# Patient Record
Sex: Female | Born: 1950 | Race: White | Hispanic: No | Marital: Married | Smoking: Former smoker
Health system: Southern US, Community
[De-identification: ages and names within clinical notes are randomized; demographics above are authoritative.]

## PROBLEM LIST (undated history)

## (undated) DIAGNOSIS — C801 Malignant (primary) neoplasm, unspecified: Secondary | ICD-10-CM

## (undated) DIAGNOSIS — K828 Other specified diseases of gallbladder: Secondary | ICD-10-CM

## (undated) DIAGNOSIS — E785 Hyperlipidemia, unspecified: Secondary | ICD-10-CM

## (undated) DIAGNOSIS — Z9889 Other specified postprocedural states: Secondary | ICD-10-CM

## (undated) DIAGNOSIS — N2889 Other specified disorders of kidney and ureter: Secondary | ICD-10-CM

## (undated) DIAGNOSIS — Z8719 Personal history of other diseases of the digestive system: Secondary | ICD-10-CM

## (undated) DIAGNOSIS — R112 Nausea with vomiting, unspecified: Secondary | ICD-10-CM

## (undated) HISTORY — PX: COLONOSCOPY: SHX174

## (undated) HISTORY — PX: HERNIA REPAIR: SHX51

## (undated) HISTORY — PX: BLADDER SUSPENSION: SHX72

## (undated) HISTORY — DX: Malignant (primary) neoplasm, unspecified: C80.1

## (undated) HISTORY — DX: Hyperlipidemia, unspecified: E78.5

---

## 1973-08-26 HISTORY — PX: ECTOPIC PREGNANCY SURGERY: SHX613

## 1981-08-26 HISTORY — PX: TUBAL LIGATION: SHX77

## 1983-08-27 HISTORY — PX: POLYPECTOMY: SHX149

## 1983-08-27 HISTORY — PX: RHINOPLASTY: SUR1284

## 1990-08-26 HISTORY — PX: TUMOR REMOVAL: SHX12

## 1998-09-06 ENCOUNTER — Other Ambulatory Visit: Admission: RE | Admit: 1998-09-06 | Discharge: 1998-09-06 | Payer: Self-pay | Admitting: Gynecology

## 1999-12-24 ENCOUNTER — Other Ambulatory Visit: Admission: RE | Admit: 1999-12-24 | Discharge: 1999-12-24 | Payer: Self-pay | Admitting: Gynecology

## 2001-04-06 ENCOUNTER — Other Ambulatory Visit: Admission: RE | Admit: 2001-04-06 | Discharge: 2001-04-06 | Payer: Self-pay | Admitting: Gynecology

## 2002-05-31 ENCOUNTER — Other Ambulatory Visit: Admission: RE | Admit: 2002-05-31 | Discharge: 2002-05-31 | Payer: Self-pay | Admitting: Gynecology

## 2003-06-13 ENCOUNTER — Other Ambulatory Visit: Admission: RE | Admit: 2003-06-13 | Discharge: 2003-06-13 | Payer: Self-pay | Admitting: Gynecology

## 2003-08-27 HISTORY — PX: NOVASURE ABLATION: SHX5394

## 2003-12-17 ENCOUNTER — Emergency Department (HOSPITAL_COMMUNITY): Admission: EM | Admit: 2003-12-17 | Discharge: 2003-12-17 | Payer: Self-pay | Admitting: Family Medicine

## 2004-08-03 ENCOUNTER — Other Ambulatory Visit: Admission: RE | Admit: 2004-08-03 | Discharge: 2004-08-03 | Payer: Self-pay | Admitting: Gynecology

## 2004-08-22 ENCOUNTER — Ambulatory Visit (HOSPITAL_COMMUNITY): Admission: RE | Admit: 2004-08-22 | Discharge: 2004-08-22 | Payer: Self-pay | Admitting: Obstetrics and Gynecology

## 2004-08-22 ENCOUNTER — Encounter (INDEPENDENT_AMBULATORY_CARE_PROVIDER_SITE_OTHER): Payer: Self-pay | Admitting: *Deleted

## 2005-05-26 HISTORY — PX: SPLENECTOMY, TOTAL: SHX788

## 2005-05-26 HISTORY — PX: NEPHRECTOMY: SHX65

## 2005-06-07 ENCOUNTER — Ambulatory Visit (HOSPITAL_COMMUNITY): Admission: RE | Admit: 2005-06-07 | Discharge: 2005-06-07 | Payer: Self-pay | Admitting: Urology

## 2005-06-10 ENCOUNTER — Encounter (INDEPENDENT_AMBULATORY_CARE_PROVIDER_SITE_OTHER): Payer: Self-pay | Admitting: *Deleted

## 2005-06-10 ENCOUNTER — Inpatient Hospital Stay (HOSPITAL_COMMUNITY): Admission: RE | Admit: 2005-06-10 | Discharge: 2005-06-14 | Payer: Self-pay | Admitting: Urology

## 2005-10-25 ENCOUNTER — Ambulatory Visit (HOSPITAL_COMMUNITY): Admission: RE | Admit: 2005-10-25 | Discharge: 2005-10-25 | Payer: Self-pay | Admitting: Obstetrics and Gynecology

## 2005-11-14 ENCOUNTER — Other Ambulatory Visit: Admission: RE | Admit: 2005-11-14 | Discharge: 2005-11-14 | Payer: Self-pay | Admitting: Obstetrics and Gynecology

## 2005-11-26 ENCOUNTER — Ambulatory Visit (HOSPITAL_COMMUNITY): Admission: RE | Admit: 2005-11-26 | Discharge: 2005-11-26 | Payer: Self-pay | Admitting: Obstetrics and Gynecology

## 2005-12-26 ENCOUNTER — Ambulatory Visit (HOSPITAL_COMMUNITY): Admission: RE | Admit: 2005-12-26 | Discharge: 2005-12-26 | Payer: Self-pay | Admitting: Urology

## 2006-03-13 ENCOUNTER — Ambulatory Visit (HOSPITAL_COMMUNITY): Admission: RE | Admit: 2006-03-13 | Discharge: 2006-03-13 | Payer: Self-pay | Admitting: Urology

## 2006-08-28 ENCOUNTER — Encounter: Admission: RE | Admit: 2006-08-28 | Discharge: 2006-08-28 | Payer: Self-pay | Admitting: Family Medicine

## 2006-09-08 ENCOUNTER — Encounter: Admission: RE | Admit: 2006-09-08 | Discharge: 2006-09-08 | Payer: Self-pay | Admitting: Family Medicine

## 2007-01-01 ENCOUNTER — Ambulatory Visit (HOSPITAL_COMMUNITY): Admission: RE | Admit: 2007-01-01 | Discharge: 2007-01-01 | Payer: Self-pay | Admitting: Obstetrics and Gynecology

## 2007-02-05 ENCOUNTER — Encounter: Admission: RE | Admit: 2007-02-05 | Discharge: 2007-02-05 | Payer: Self-pay | Admitting: Family Medicine

## 2007-08-27 HISTORY — PX: LOBECTOMY: SHX5089

## 2007-12-09 ENCOUNTER — Ambulatory Visit (HOSPITAL_COMMUNITY): Admission: RE | Admit: 2007-12-09 | Discharge: 2007-12-09 | Payer: Self-pay | Admitting: Obstetrics and Gynecology

## 2008-02-18 ENCOUNTER — Ambulatory Visit (HOSPITAL_COMMUNITY): Admission: RE | Admit: 2008-02-18 | Discharge: 2008-02-18 | Payer: Self-pay | Admitting: Obstetrics and Gynecology

## 2008-02-29 ENCOUNTER — Encounter: Admission: RE | Admit: 2008-02-29 | Discharge: 2008-02-29 | Payer: Self-pay | Admitting: Family Medicine

## 2008-03-04 ENCOUNTER — Ambulatory Visit (HOSPITAL_COMMUNITY): Admission: RE | Admit: 2008-03-04 | Discharge: 2008-03-04 | Payer: Self-pay | Admitting: Family Medicine

## 2008-03-15 ENCOUNTER — Ambulatory Visit: Payer: Self-pay | Admitting: Thoracic Surgery

## 2008-05-31 ENCOUNTER — Encounter: Admission: RE | Admit: 2008-05-31 | Discharge: 2008-05-31 | Payer: Self-pay | Admitting: Thoracic Surgery

## 2008-05-31 ENCOUNTER — Ambulatory Visit: Payer: Self-pay | Admitting: Thoracic Surgery

## 2008-08-03 ENCOUNTER — Ambulatory Visit: Payer: Self-pay | Admitting: Thoracic Surgery

## 2008-08-03 ENCOUNTER — Encounter: Admission: RE | Admit: 2008-08-03 | Discharge: 2008-08-03 | Payer: Self-pay | Admitting: Thoracic Surgery

## 2008-08-12 ENCOUNTER — Ambulatory Visit: Payer: Self-pay | Admitting: Thoracic Surgery

## 2008-08-12 ENCOUNTER — Encounter: Payer: Self-pay | Admitting: Thoracic Surgery

## 2008-08-12 ENCOUNTER — Inpatient Hospital Stay (HOSPITAL_COMMUNITY): Admission: RE | Admit: 2008-08-12 | Discharge: 2008-08-15 | Payer: Self-pay | Admitting: Thoracic Surgery

## 2008-08-24 ENCOUNTER — Ambulatory Visit: Payer: Self-pay | Admitting: Thoracic Surgery

## 2008-08-24 ENCOUNTER — Encounter: Admission: RE | Admit: 2008-08-24 | Discharge: 2008-08-24 | Payer: Self-pay | Admitting: Thoracic Surgery

## 2008-08-29 ENCOUNTER — Ambulatory Visit: Payer: Self-pay | Admitting: Oncology

## 2008-09-06 LAB — CBC WITH DIFFERENTIAL/PLATELET
EOS%: 8.1 % — ABNORMAL HIGH (ref 0.0–7.0)
Eosinophils Absolute: 0.6 10*3/uL — ABNORMAL HIGH (ref 0.0–0.5)
HCT: 37.9 % (ref 34.8–46.6)
HGB: 12.8 g/dL (ref 11.6–15.9)
LYMPH%: 37.2 % (ref 14.0–48.0)
MCH: 32.8 pg (ref 26.0–34.0)
MCV: 97.2 fL (ref 81.0–101.0)
MONO%: 9.2 % (ref 0.0–13.0)
Platelets: 366 10*3/uL (ref 145–400)
RBC: 3.9 10*6/uL (ref 3.70–5.32)
WBC: 7.8 10*3/uL (ref 3.9–10.0)

## 2008-09-06 LAB — COMPREHENSIVE METABOLIC PANEL
AST: 21 U/L (ref 0–37)
Alkaline Phosphatase: 62 U/L (ref 39–117)
BUN: 16 mg/dL (ref 6–23)
Glucose, Bld: 94 mg/dL (ref 70–99)
Sodium: 142 mEq/L (ref 135–145)
Total Bilirubin: 0.4 mg/dL (ref 0.3–1.2)

## 2008-09-14 ENCOUNTER — Encounter: Admission: RE | Admit: 2008-09-14 | Discharge: 2008-09-14 | Payer: Self-pay | Admitting: Thoracic Surgery

## 2008-09-14 ENCOUNTER — Ambulatory Visit: Payer: Self-pay | Admitting: Thoracic Surgery

## 2008-10-26 ENCOUNTER — Ambulatory Visit: Payer: Self-pay | Admitting: Thoracic Surgery

## 2008-10-26 ENCOUNTER — Encounter: Admission: RE | Admit: 2008-10-26 | Discharge: 2008-10-26 | Payer: Self-pay | Admitting: Thoracic Surgery

## 2008-11-03 ENCOUNTER — Ambulatory Visit: Payer: Self-pay | Admitting: Oncology

## 2008-11-07 ENCOUNTER — Ambulatory Visit (HOSPITAL_COMMUNITY): Admission: RE | Admit: 2008-11-07 | Discharge: 2008-11-07 | Payer: Self-pay | Admitting: Oncology

## 2008-11-07 LAB — CBC WITH DIFFERENTIAL/PLATELET
Basophils Absolute: 0 10*3/uL (ref 0.0–0.1)
EOS%: 3 % (ref 0.0–7.0)
HCT: 39.6 % (ref 34.8–46.6)
HGB: 13.4 g/dL (ref 11.6–15.9)
LYMPH%: 42.4 % (ref 14.0–49.7)
MCH: 32.7 pg (ref 25.1–34.0)
MCV: 96.9 fL (ref 79.5–101.0)
MONO%: 10 % (ref 0.0–14.0)
NEUT%: 44.2 % (ref 38.4–76.8)
Platelets: 269 10*3/uL (ref 145–400)
lymph#: 3.3 10*3/uL (ref 0.9–3.3)

## 2008-11-07 LAB — COMPREHENSIVE METABOLIC PANEL
AST: 24 U/L (ref 0–37)
BUN: 20 mg/dL (ref 6–23)
Calcium: 9.7 mg/dL (ref 8.4–10.5)
Chloride: 106 mEq/L (ref 96–112)
Creatinine, Ser: 1.12 mg/dL (ref 0.40–1.20)

## 2008-11-07 LAB — LACTATE DEHYDROGENASE: LDH: 147 U/L (ref 94–250)

## 2009-02-10 ENCOUNTER — Ambulatory Visit: Payer: Self-pay | Admitting: Oncology

## 2009-02-14 LAB — CBC WITH DIFFERENTIAL/PLATELET
BASO%: 1.2 % (ref 0.0–2.0)
EOS%: 6.5 % (ref 0.0–7.0)
HCT: 40 % (ref 34.8–46.6)
LYMPH%: 39.7 % (ref 14.0–49.7)
MCH: 33.4 pg (ref 25.1–34.0)
MCHC: 34.2 g/dL (ref 31.5–36.0)
NEUT%: 43.8 % (ref 38.4–76.8)
Platelets: 270 10*3/uL (ref 145–400)
RBC: 4.09 10*6/uL (ref 3.70–5.45)

## 2009-02-14 LAB — COMPREHENSIVE METABOLIC PANEL
ALT: 20 U/L (ref 0–35)
AST: 22 U/L (ref 0–37)
Alkaline Phosphatase: 60 U/L (ref 39–117)
Creatinine, Ser: 1.06 mg/dL (ref 0.40–1.20)
Total Bilirubin: 0.8 mg/dL (ref 0.3–1.2)

## 2009-05-11 ENCOUNTER — Ambulatory Visit: Payer: Self-pay | Admitting: Oncology

## 2009-05-15 ENCOUNTER — Ambulatory Visit (HOSPITAL_COMMUNITY): Admission: RE | Admit: 2009-05-15 | Discharge: 2009-05-15 | Payer: Self-pay | Admitting: Oncology

## 2009-05-15 LAB — COMPREHENSIVE METABOLIC PANEL
ALT: 23 U/L (ref 0–35)
Albumin: 3.9 g/dL (ref 3.5–5.2)
CO2: 27 mEq/L (ref 19–32)
Chloride: 107 mEq/L (ref 96–112)
Glucose, Bld: 107 mg/dL — ABNORMAL HIGH (ref 70–99)
Potassium: 4.3 mEq/L (ref 3.5–5.3)
Sodium: 140 mEq/L (ref 135–145)
Total Bilirubin: 0.6 mg/dL (ref 0.3–1.2)
Total Protein: 7.1 g/dL (ref 6.0–8.3)

## 2009-05-15 LAB — CBC WITH DIFFERENTIAL/PLATELET
BASO%: 0.9 % (ref 0.0–2.0)
Eosinophils Absolute: 0.3 10*3/uL (ref 0.0–0.5)
LYMPH%: 44.2 % (ref 14.0–49.7)
MCHC: 33.9 g/dL (ref 31.5–36.0)
MONO#: 0.6 10*3/uL (ref 0.1–0.9)
NEUT#: 2.2 10*3/uL (ref 1.5–6.5)
Platelets: 256 10*3/uL (ref 145–400)
RBC: 3.94 10*6/uL (ref 3.70–5.45)
RDW: 13.8 % (ref 11.2–14.5)
WBC: 5.7 10*3/uL (ref 3.9–10.3)
lymph#: 2.5 10*3/uL (ref 0.9–3.3)

## 2009-05-15 LAB — LACTATE DEHYDROGENASE: LDH: 148 U/L (ref 94–250)

## 2009-06-07 ENCOUNTER — Ambulatory Visit (HOSPITAL_COMMUNITY): Admission: RE | Admit: 2009-06-07 | Discharge: 2009-06-07 | Payer: Self-pay | Admitting: Obstetrics and Gynecology

## 2009-08-11 ENCOUNTER — Ambulatory Visit: Payer: Self-pay | Admitting: Oncology

## 2009-08-15 LAB — CBC WITH DIFFERENTIAL/PLATELET
Basophils Absolute: 0.1 10*3/uL (ref 0.0–0.1)
Eosinophils Absolute: 0.2 10*3/uL (ref 0.0–0.5)
HGB: 13.3 g/dL (ref 11.6–15.9)
MCV: 100 fL (ref 79.5–101.0)
MONO#: 0.6 10*3/uL (ref 0.1–0.9)
MONO%: 10.3 % (ref 0.0–14.0)
NEUT#: 2.3 10*3/uL (ref 1.5–6.5)
Platelets: 268 10*3/uL (ref 145–400)
RBC: 3.95 10*6/uL (ref 3.70–5.45)
RDW: 13.9 % (ref 11.2–14.5)
WBC: 5.6 10*3/uL (ref 3.9–10.3)

## 2009-08-15 LAB — COMPREHENSIVE METABOLIC PANEL
Albumin: 4.5 g/dL (ref 3.5–5.2)
Alkaline Phosphatase: 52 U/L (ref 39–117)
BUN: 15 mg/dL (ref 6–23)
CO2: 24 mEq/L (ref 19–32)
Calcium: 10 mg/dL (ref 8.4–10.5)
Glucose, Bld: 119 mg/dL — ABNORMAL HIGH (ref 70–99)
Potassium: 4.5 mEq/L (ref 3.5–5.3)
Sodium: 140 mEq/L (ref 135–145)
Total Protein: 7.5 g/dL (ref 6.0–8.3)

## 2009-08-15 LAB — LACTATE DEHYDROGENASE: LDH: 170 U/L (ref 94–250)

## 2009-10-09 IMAGING — CR DG CHEST 2V
2 series · 2 of 2 positions shown · non-contrast
Comparison: 08/15/2008

CLINICAL DATA: Right lung surgery 08/12/2008.

CHEST - 2 VIEW

[w chest pa]
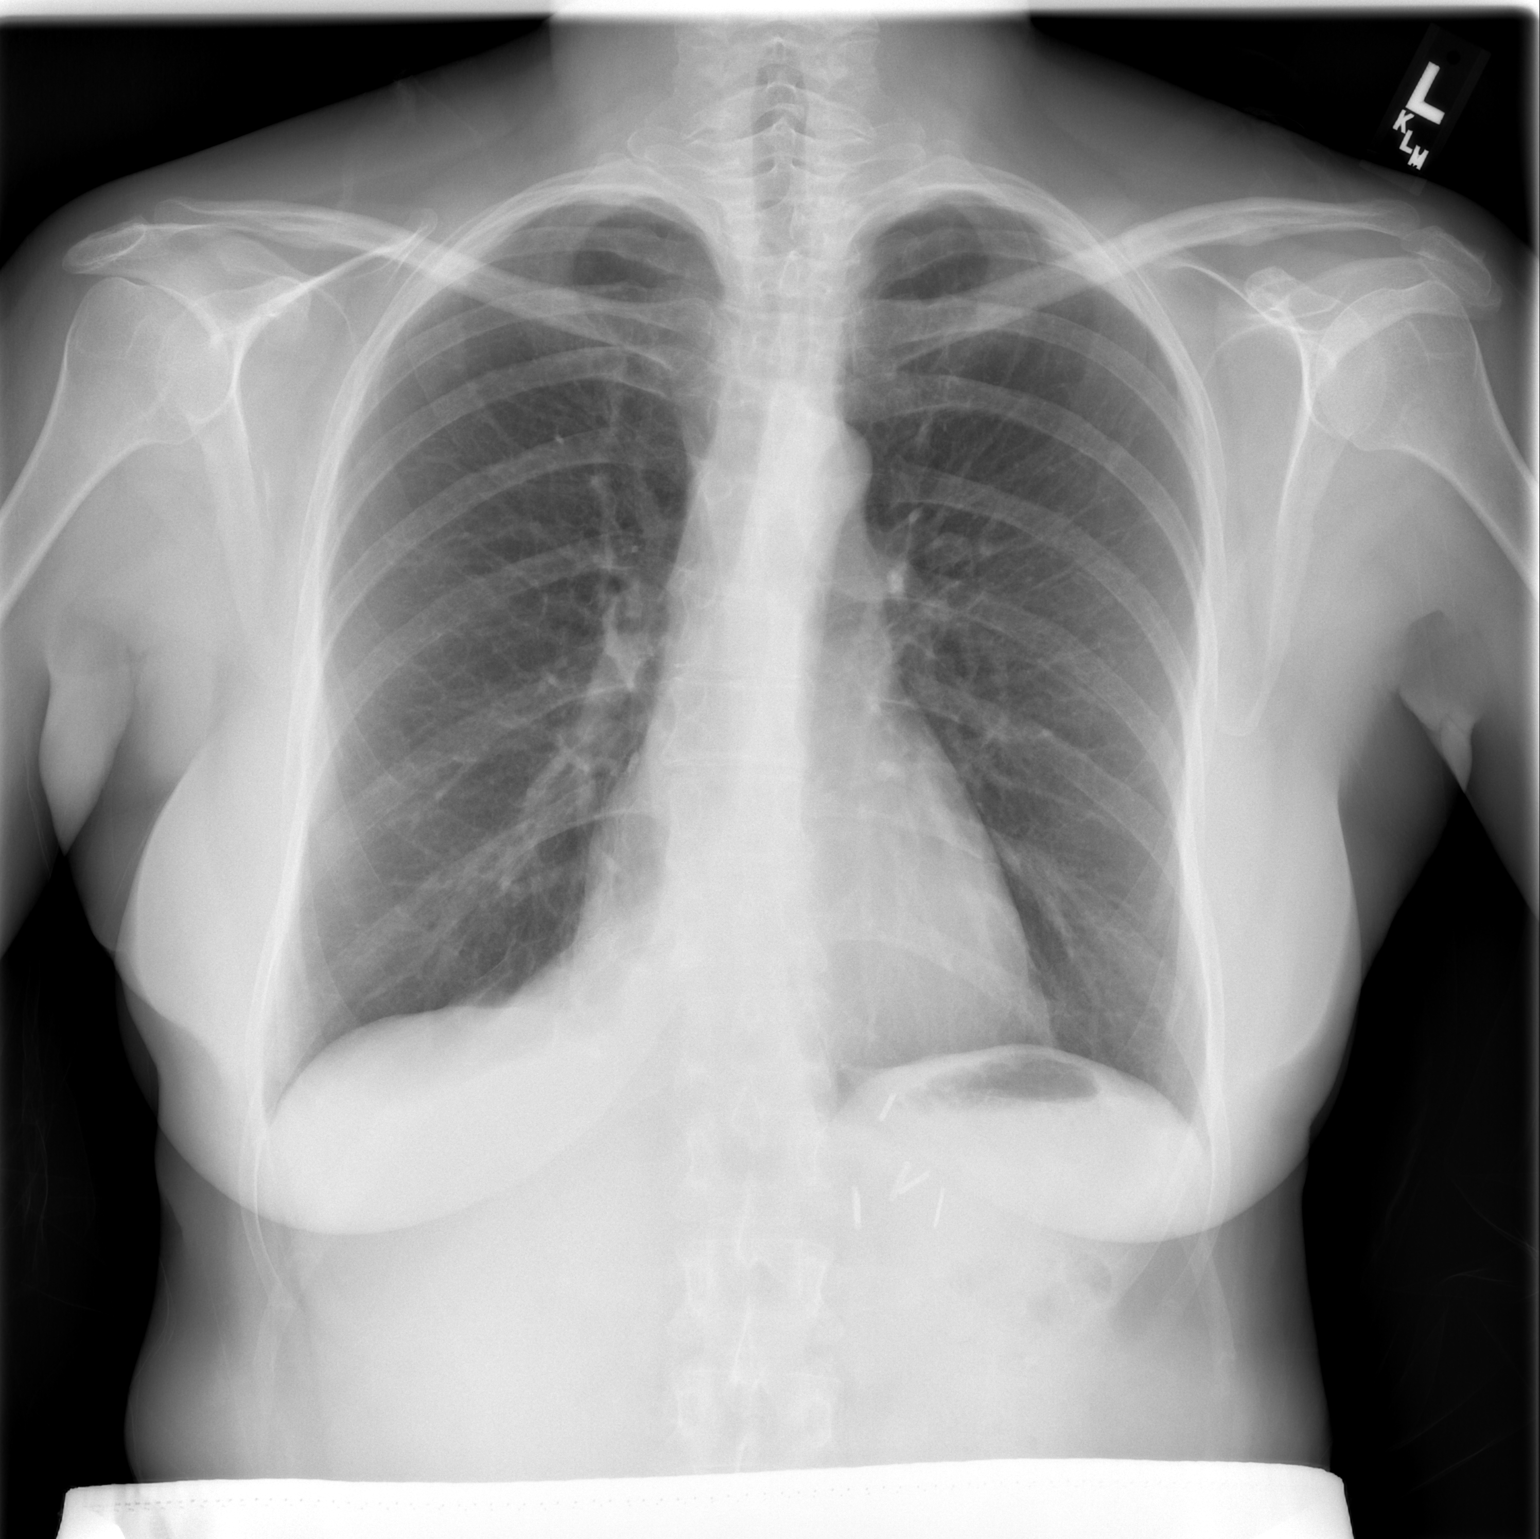

[w chest lat]
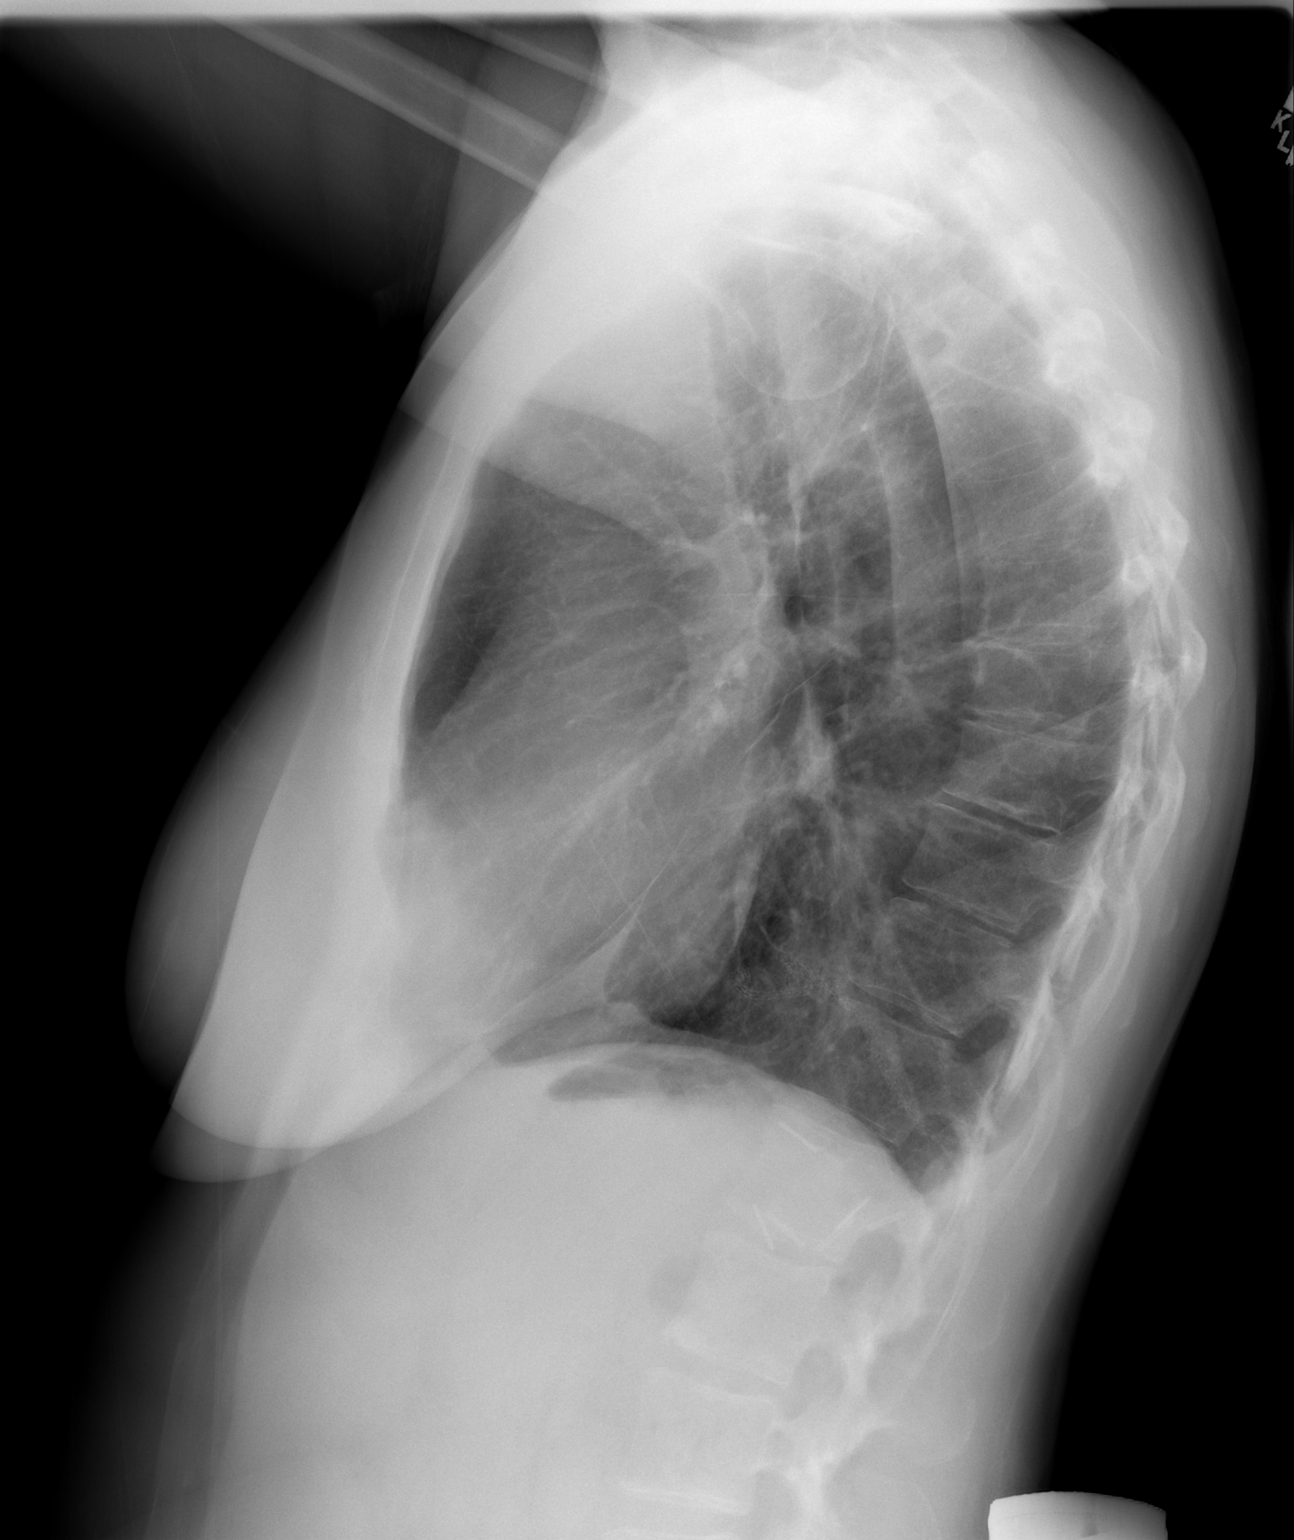

[2 of 2 positions shown; findings below may reference images not displayed]

FINDINGS: Trachea is midline.  Heart size normal.  The patient is
status post right VATS and wedge resection of the right lower lobe.
Tiny right apical pneumothorax, right pleural effusion, right
basilar atelectasis and subcutaneous emphysema have resolved in the
interval.  No pneumothorax.  Lungs otherwise clear.  No pleural
fluid.  Surgical clips are seen in the region of the
gastroesophageal junction.
IMPRESSION: 1.  Right VATS and wedge resection of the right lower lobe for
metastatic renal cell carcinoma.
2.  Interval resolution of tiny right apical pneumothorax, right
pleural effusion, right basilar atelectasis and subcutaneous
emphysema.

## 2009-11-10 ENCOUNTER — Ambulatory Visit: Payer: Self-pay | Admitting: Oncology

## 2009-11-14 ENCOUNTER — Ambulatory Visit (HOSPITAL_COMMUNITY): Admission: RE | Admit: 2009-11-14 | Discharge: 2009-11-14 | Payer: Self-pay | Admitting: Oncology

## 2009-11-14 LAB — CBC WITH DIFFERENTIAL/PLATELET
BASO%: 1.6 % (ref 0.0–2.0)
Basophils Absolute: 0.1 10e3/uL (ref 0.0–0.1)
EOS%: 4.5 % (ref 0.0–7.0)
Eosinophils Absolute: 0.3 10e3/uL (ref 0.0–0.5)
HCT: 40.6 % (ref 34.8–46.6)
HGB: 13.9 g/dL (ref 11.6–15.9)
LYMPH%: 42.2 % (ref 14.0–49.7)
MCH: 33.8 pg (ref 25.1–34.0)
MCHC: 34.1 g/dL (ref 31.5–36.0)
MCV: 99 fL (ref 79.5–101.0)
MONO#: 0.7 10e3/uL (ref 0.1–0.9)
MONO%: 11.1 % (ref 0.0–14.0)
NEUT#: 2.5 10e3/uL (ref 1.5–6.5)
NEUT%: 40.6 % (ref 38.4–76.8)
Platelets: 286 10e3/uL (ref 145–400)
RBC: 4.1 10e6/uL (ref 3.70–5.45)
RDW: 13.9 % (ref 11.2–14.5)
WBC: 6.1 10e3/uL (ref 3.9–10.3)
lymph#: 2.6 10e3/uL (ref 0.9–3.3)

## 2009-11-14 LAB — COMPREHENSIVE METABOLIC PANEL WITH GFR
ALT: 24 U/L (ref 0–35)
AST: 23 U/L (ref 0–37)
Albumin: 4.2 g/dL (ref 3.5–5.2)
Alkaline Phosphatase: 57 U/L (ref 39–117)
BUN: 15 mg/dL (ref 6–23)
CO2: 28 meq/L (ref 19–32)
Calcium: 9.8 mg/dL (ref 8.4–10.5)
Chloride: 106 meq/L (ref 96–112)
Creatinine, Ser: 1.1 mg/dL (ref 0.40–1.20)
Glucose, Bld: 102 mg/dL — ABNORMAL HIGH (ref 70–99)
Potassium: 4.8 meq/L (ref 3.5–5.3)
Sodium: 140 meq/L (ref 135–145)
Total Bilirubin: 0.5 mg/dL (ref 0.3–1.2)
Total Protein: 7.7 g/dL (ref 6.0–8.3)

## 2009-11-14 LAB — LACTATE DEHYDROGENASE: LDH: 148 U/L (ref 94–250)

## 2010-02-23 ENCOUNTER — Ambulatory Visit (HOSPITAL_COMMUNITY): Admission: RE | Admit: 2010-02-23 | Discharge: 2010-02-23 | Payer: Self-pay | Admitting: Obstetrics and Gynecology

## 2010-05-07 ENCOUNTER — Ambulatory Visit (HOSPITAL_BASED_OUTPATIENT_CLINIC_OR_DEPARTMENT_OTHER): Payer: BC Managed Care – PPO | Admitting: Oncology

## 2010-05-08 ENCOUNTER — Ambulatory Visit (HOSPITAL_COMMUNITY): Admission: RE | Admit: 2010-05-08 | Discharge: 2010-05-08 | Payer: Self-pay | Admitting: Oncology

## 2010-05-08 LAB — CBC WITH DIFFERENTIAL/PLATELET
Basophils Absolute: 0.1 10*3/uL (ref 0.0–0.1)
Eosinophils Absolute: 0.2 10*3/uL (ref 0.0–0.5)
HGB: 13.9 g/dL (ref 11.6–15.9)
MCV: 98.5 fL (ref 79.5–101.0)
MONO#: 0.6 10*3/uL (ref 0.1–0.9)
MONO%: 10.8 % (ref 0.0–14.0)
NEUT#: 2.6 10*3/uL (ref 1.5–6.5)
Platelets: 264 10*3/uL (ref 145–400)
RDW: 14.4 % (ref 11.2–14.5)

## 2010-05-08 LAB — COMPREHENSIVE METABOLIC PANEL
Albumin: 4.4 g/dL (ref 3.5–5.2)
Alkaline Phosphatase: 59 U/L (ref 39–117)
BUN: 16 mg/dL (ref 6–23)
CO2: 23 mEq/L (ref 19–32)
Calcium: 9.8 mg/dL (ref 8.4–10.5)
Glucose, Bld: 101 mg/dL — ABNORMAL HIGH (ref 70–99)
Potassium: 4.7 mEq/L (ref 3.5–5.3)

## 2010-05-08 LAB — LACTATE DEHYDROGENASE: LDH: 177 U/L (ref 94–250)

## 2010-07-02 ENCOUNTER — Ambulatory Visit (HOSPITAL_BASED_OUTPATIENT_CLINIC_OR_DEPARTMENT_OTHER): Admission: RE | Admit: 2010-07-02 | Discharge: 2010-07-02 | Payer: Self-pay | Admitting: Urology

## 2010-09-16 ENCOUNTER — Encounter: Payer: Self-pay | Admitting: Oncology

## 2010-11-06 LAB — POCT HEMOGLOBIN-HEMACUE: Hemoglobin: 13.4 g/dL (ref 12.0–15.0)

## 2010-11-08 ENCOUNTER — Ambulatory Visit (HOSPITAL_COMMUNITY)
Admission: RE | Admit: 2010-11-08 | Discharge: 2010-11-08 | Disposition: A | Discharge status: Home / Self Care | Payer: BC Managed Care – PPO | Source: Ambulatory Visit | Attending: Oncology | Admitting: Oncology

## 2010-11-08 ENCOUNTER — Other Ambulatory Visit: Payer: Self-pay | Admitting: Oncology

## 2010-11-08 ENCOUNTER — Encounter (HOSPITAL_BASED_OUTPATIENT_CLINIC_OR_DEPARTMENT_OTHER): Payer: BC Managed Care – PPO | Admitting: Oncology

## 2010-11-08 DIAGNOSIS — C7800 Secondary malignant neoplasm of unspecified lung: Secondary | ICD-10-CM

## 2010-11-08 DIAGNOSIS — R918 Other nonspecific abnormal finding of lung field: Secondary | ICD-10-CM

## 2010-11-08 DIAGNOSIS — Z8051 Family history of malignant neoplasm of kidney: Secondary | ICD-10-CM

## 2010-11-08 DIAGNOSIS — Z85528 Personal history of other malignant neoplasm of kidney: Secondary | ICD-10-CM | POA: Insufficient documentation

## 2010-11-08 DIAGNOSIS — C649 Malignant neoplasm of unspecified kidney, except renal pelvis: Secondary | ICD-10-CM

## 2010-11-08 DIAGNOSIS — C801 Malignant (primary) neoplasm, unspecified: Secondary | ICD-10-CM

## 2010-11-08 DIAGNOSIS — Z905 Acquired absence of kidney: Secondary | ICD-10-CM | POA: Insufficient documentation

## 2010-11-08 LAB — COMPREHENSIVE METABOLIC PANEL
ALT: 16 U/L (ref 0–35)
AST: 19 U/L (ref 0–37)
Alkaline Phosphatase: 57 U/L (ref 39–117)
CO2: 26 mEq/L (ref 19–32)
Creatinine, Ser: 1.09 mg/dL (ref 0.40–1.20)
Sodium: 139 mEq/L (ref 135–145)
Total Bilirubin: 0.5 mg/dL (ref 0.3–1.2)
Total Protein: 7.2 g/dL (ref 6.0–8.3)

## 2010-11-08 LAB — POCT I-STAT, CHEM 8
Calcium, Ion: 1.24 mmol/L (ref 1.12–1.32)
Chloride: 106 mEq/L (ref 96–112)
Glucose, Bld: 100 mg/dL — ABNORMAL HIGH (ref 70–99)
HCT: 45 % (ref 36.0–46.0)
Hemoglobin: 15.3 g/dL — ABNORMAL HIGH (ref 12.0–15.0)

## 2010-11-08 LAB — CBC WITH DIFFERENTIAL/PLATELET
BASO%: 1 % (ref 0.0–2.0)
Basophils Absolute: 0.1 10*3/uL (ref 0.0–0.1)
EOS%: 3.6 % (ref 0.0–7.0)
MCH: 32 pg (ref 25.1–34.0)
MCHC: 32.9 g/dL (ref 31.5–36.0)
MCV: 97.4 fL (ref 79.5–101.0)
MONO%: 10.8 % (ref 0.0–14.0)
NEUT%: 37.6 % — ABNORMAL LOW (ref 38.4–76.8)
RDW: 13.7 % (ref 11.2–14.5)
lymph#: 2.7 10*3/uL (ref 0.9–3.3)

## 2010-11-11 LAB — CBC
MCHC: 33.6 g/dL (ref 30.0–36.0)
MCV: 99.4 fL (ref 78.0–100.0)
Platelets: 254 10*3/uL (ref 150–400)
RDW: 14.4 % (ref 11.5–15.5)
WBC: 5.9 10*3/uL (ref 4.0–10.5)

## 2010-11-11 LAB — LIPID PANEL
HDL: 65 mg/dL (ref >39)
Triglycerides: 118 mg/dL (ref <150)
VLDL: 24 mg/dL (ref 0–40)

## 2011-01-08 NOTE — Op Note (Signed)
Jenny Soto, Jenny Soto                ACCOUNT NO.:  [id number]   MEDICAL RECORD NO.:  [id number]          PATIENT TYPE:  OUT   LOCATION:                               FACILITY:  [facility]   PHYSICIAN:  Ines Bloomer, M.D. DATE OF BIRTH:  03/29/1951   DATE OF PROCEDURE:  DATE OF DISCHARGE:                               OPERATIVE REPORT   PREOPERATIVE DIAGNOSIS:  Right lower nodule.   POSTOPERATIVE DIAGNOSIS:  Right lower nodule.   OPERATION PERFORMED:  Right VATS median thoracotomy, wedge resection of  right lower lobe lesion.   SURGEON:  Ines Bloomer, MD   FIRST ASSISTANT:  Coral Ceo, PA-C   ANESTHESIA:  General anesthesia.   The patient was prepped and draped in the usual sterile manner and  turned to the right lateral thoracotomy position.  A Dual-lumen tube was  inserted.  The right lung was inflated.  Two trocar sites were made in  the anterior and posterior axillary line at the seventh intercostal  space, two trocars were inserted.  The lesion was seen in the medial  basal segment of the right lower lobe.  A 5-cm incision was made with  sixth intercostal space.  Dissection was carried down and, grasping this  with a Kaiser ring forceps, this was brought superiorly and then coming  into the  posterior trocar site, which was resected with the Covidien  4.5 stapler with 2 applications and sent for frozen section.  A single  chest tube was inserted anteriorly and tied in place with 0 silk.  Marcaine block was done in the usual fashion.  A single On-Q was  inserted in the usual fashion.  Chest was closed with 1 pericostal, #1  Vicryl in the muscle layer, 2-0 Vicryl in the subcutaneous tissue, and  Dermabond for the skin.      Ines Bloomer, M.D.  Electronically Signed     DPB/MEDQ  D:  08/24/2008  T:  08/25/2008  Job:  [id number]

## 2011-01-08 NOTE — Letter (Signed)
August 24, 2008   Mosetta Putt, MD  [address] [place], [place] [address]   Re:  CAELYN, ROUTE                  DOB:  03-07-1951   Dear Cindee Lame:   The patient came today after a wedge resection of right lower lobe  lesion that revealed metastatic renal cell cancer.  Her incision was  well healed.  The apical pneumothorax was resolving and her atelectasis  was improving.  Her incision is well healed.  There is some bruising  around the incision.  I removed her chest tube sutures.  We will see her  back again in 3 weeks with a chest x-ray and I will referred her to Dr.  Eli Hose for evaluation for possible chemotherapy.  She had  previously seen Dr. Bari Mantis at [facility] and wanted to be followed here.  Her blood pressure was 127/83, pulse 98, respirations 18, and sats were  99%.   Ines Bloomer, M.D.  Electronically Signed   DPB/MEDQ  D:  08/24/2008  T:  08/24/2008  Job:  [id number]   cc:   Windy Fast L. Earlene Plater, M.D.  Blenda Nicely. [employer]

## 2011-01-08 NOTE — H&P (Signed)
Jenny Soto, Jenny Soto                ACCOUNT NO.:  [id number]   MEDICAL RECORD NO.:  [id number]          PATIENT TYPE:  INP   LOCATION:  NA                           FACILITY:  [facility]   PHYSICIAN:  Ines Bloomer, M.D. DATE OF BIRTH:  December 23, 1950   DATE OF ADMISSION:  DATE OF DISCHARGE:                              HISTORY & PHYSICAL   CHIEF COMPLAINT:  Lung nodule.   HISTORY OF PRESENT ILLNESS:  The patient had a left nephrectomy and  splenectomy in October 2006 for renal cell cancer.  Serial CT scan  showed a 6-mm nodule in the right lower lobe and there was a ground-  glass appearance in the left upper lobe, but this on subsequent CT  resolved.  She had a PET scan done and showed no evidence of uptake in  the right lower lobe lesion.  Her pulmonary function test was 2.94 with  an FEV1 of 2.39.  She had no hemoptysis, fever, chills, or extra weight  loss, and we followed her with serial CT scans and the 6-mm lesion is  now up to 8 x 10 mm.   PAST MEDICAL HISTORY:  Negative except for the renal cell cancer.   ALLERGIES:  She has no allergies.   MEDICATIONS:  She is on vitamin D and lovastatin 20 mg a day for  dyslipidemia.   FAMILY HISTORY:  She is adopted.   SOCIAL HISTORY:  She is married, has 2 children.  Works as a Runner, broadcasting/film/video.  She quit smoking in October 2006, occasional alcohol intake.   REVIEW OF SYSTEMS:  She is 148 pounds.  She is 5 foot 8 inches.  GENERAL:  Her weight has been stable.  CARDIAC:  No angina or atrial  fibrillation.  PULMONARY:  No hemoptysis, asthma.  GI:  No nausea,  vomiting, constipation, or diarrhea.  GU:  See history of present  illness.  VASCULAR:  No claudication, DVT, TIAs.  NEUROLOGICAL:  No  dizziness, headaches, blackouts, or seizures.  MUSCULOSKELETAL:  No  arthritis or joint pain.  PSYCHIATRIC:  No depression or nervous.  ENT:  No change in her eye sight or hearing.  HEMATOLOGICAL:  No problems with  bleeding or clotting disorders or  anemia.   PHYSICAL EXAMINATION:  GENERAL:  She is a well-developed Caucasian  female, in no acute distress.  VITAL SIGNS:  Her blood pressure 132/83, pulse 80, respirations 18, and  temperature was 97%.  HEENT:  Head is atraumatic.  Eyes, pupils equal, reactive to light and  accommodation.  Extraocular movements normal.  Ears, tympanic membranes  are intact.  Nose, there is no septal deviation.  Throat, uvula is  midline.  LUNGS:  No lesions.  NECK:  Supple without thyromegaly.  There is no supraclavicular or  axillary adenopathy.  No jugular venous distention.  CHEST:  Clear to auscultation or percussion.  HEART:  Regular sinus rhythm.  No murmurs.  ABDOMEN:  Left nephrectomy sore.  Bowel sounds are normal.  Abdomen is  nontender.  EXTREMITIES:  Pulses are 2+.  There is no clubbing or edema.  NEUROLOGICAL:  She is oriented x3.  Sensory and motor is intact.  Cranial nerves intact.  SKIN:  Without lesion.   IMPRESSION:  1. Enlarging right lower lobe lesion.  Plan, right video-assisted      thoracic surgery and wedge resection of right lower lobe lesion.  2. She has a status post left nephrectomy for nonsmall cell lung      cancer.      Ines Bloomer, M.D.  Electronically Signed     DPB/MEDQ  D:  08/10/2008  T:  08/11/2008  Job:  [id number]

## 2011-01-08 NOTE — Letter (Signed)
May 31, 2008   Carolyne Fiscal, MD  [address] [place], [place] [address]   Re:  KYRIE, BUN                  DOB:  October 24, 1950   Dear [name],   I saw the patient back today and repeated her CT scan and there is a  question this may increase from 0.8-0.9 mm.  I am little concerned about  this.  I talked to her about possible resection.  She does not really  want to do that now and since she was on a minimal increase, I think we  can at least follow it.  We will plan to get another CT scan in December  and then make a decision about going ahead with a wedge resection of  this.  Her blood pressure was 138/88, pulse 86, respirations 18, and  sats were 100%.   Ines Bloomer, M.D.  Electronically Signed   DPB/MEDQ  D:  05/31/2008  T:  05/31/2008  Job:  [id number]

## 2011-01-08 NOTE — Letter (Signed)
March 15, 2008   Jenny Putt, MD  [address] [place], [place] [address]   Re:  Jenny Soto, MILOSEVIC                  DOB:  Oct 25, 1950   Dear Pet,   This 60 year old patient had a left nephrectomy and splenectomy  approximately in October 2006, for renal cell cancer.  She is followed  with CT scans with no evidence of any recurrence until a recent CT scan  showed a 6 mm nodule in the right lower lobe.  In January 2009, there  was a question of a 6 mm left upper lobe ground-glass lesion.  On the  followup CT, this appeared to have resolved.  She also had a PET scan  that was done that showed no evidence of uptake in the right lower lobe  lesion.  She is referred here for evaluation.  Her creatinine is 0.99.  Her pulmonary function test showed an FVC of 2.94 and FEV-1 of 2.39.  She has had no weight loss, no fever, chills, or excessive sputum.   PAST MEDICAL HISTORY:   MEDICATIONS:  Include vitamin D and 20 mg of lovastatin.   ALLERGIES:  She has no allergies.   PAST MEDICAL HISTORY:  Other than the kidney cancer, she has  hypercholesterolemia.   FAMILY HISTORY:  She is adopted.   SOCIAL HISTORY:  She is married, has 2 children, and comes in today with  her husband.  She works as a Runner, broadcasting/film/video.  Quit smoking in October 2006, and  has occasional alcohol intake.   REVIEW OF SYSTEMS:  She is 148 pounds.  She is 5 feet 8 inches.  In  general, her weights has been stable.  Cardiac:  No angina or atrial  fibrillation.  Pulmonary:  No hemoptysis, asthma, or wheezing.  GI:  No  nausea, vomiting, constipation, or diarrhea.  GU:  See history of  present illness.  Vascular:  No claudication, DVT, or TIAs.  Neurologic:  No dizziness, headaches, blackouts, or seizures.  Musculoskeletal:  No  arthritis or joint pain.  Psychiatric:  No psychiatric illness or  nervousness.  Eye/ENT:  No changes in her eyesight or hearing.  Hematologic:  No problems with bleeding, clotting disorders, or  anemia.   PHYSICAL EXAMINATION:  GENERAL:  She is a well-developed Caucasian  female in no acute distress.  VITAL SIGNS:  Her blood pressure is 132/83, pulse 80, respirations 18,  and sats were 99%.  HEAD, EYES, EARS, NOSE AND THROAT:  Unremarkable.  NECK:  Supple without thyromegaly.  There is no supraclavicular or  axillary adenopathy.  CHEST:  Clear to auscultation and percussion.  HEART:  Regular sinus rhythm.  No murmurs.  ABDOMEN:  There is a left nephrectomy scar.  Bowel sounds are normal.  EXTREMITIES:  Pulses are 2+.  There is no clubbing or edema.  NEUROLOGIC:  She is oriented x3.  Sensory and motor intact.  Cranial  nerves are intact.   I discussed this with the patient and her husband and I have recommended  that she just be followed closely.  There is a possibility that this is  recurrent renal cell cancer, but it is 6 mm.  I think we can safely  follow this.  We will see her back again in 6 months and in 3 months  with a CT scan.  I have explained to her that if this has gotten bigger,  then we definitely would  recommend resection.   Ines Bloomer, M.D.  Electronically Signed   DPB/MEDQ  D:  03/15/2008  T:  03/16/2008  Job:  [id number]

## 2011-01-08 NOTE — Assessment & Plan Note (Signed)
OFFICE VISIT   Jenny, Soto  DOB:  [birth year]                                        September 14, 2008  CHART #:  [id number]   The patient is status post right VATS, minithoracotomy for wedge  resection of right lower lobe lesion done by Dr. Edwyna Shell on August 12, 2008.  This came back positive for metastatic renal cell cancer.  The  patient did well postoperatively and was discharged to home on August 15, 2008.  She presents today for a second followup visit.  The patient  is progressing well.  She does complain of some sharp, stabbing pain  when something rubs over her skin on the right side.  She is up  ambulating well without difficulty.  Denies any significant shortness of  breath, coughing, wheezing, opening or drainage from her incisions.  She  plans to go back to work tomorrow.   PHYSICAL EXAMINATION:  VITAL SIGNS:  Blood pressure 122/79, pulse 99,  respirations of 18, and O2 sats 98% on room air.  RESPIRATORY:  Clear to auscultation bilaterally.  CARDIAC:  Regular rate and rhythm.  SKIN:  Incisions all clean, dry, and intact and healing well.   STUDIES:  The patient had PA and lateral chest x-ray on August 14, 2009, showing postoperative changes in the right hemithorax with no  acute findings.  No pneumothorax or effusion noted.   IMPRESSION AND PLAN:  The patient is progressing well.  She was seen and  evaluated by Dr. Edwyna Shell.  Dr. Edwyna Shell evaluated the patient's chest x-  ray.  The patient is told that stabbing skin pain that she is feeling is  secondary to nerve injury.  This will resolve over time.  She is told to  increase her activity as tolerated.  She is to continue to use her  incentive spirometer.  We will  plan to see the patient back in 6 weeks with a repeat chest x-ray.  She  is told if she has any surgical issues, she is to contact us.   Ines Bloomer, M.D.  Electronically Signed   KMD/MEDQ  D:  09/14/2008  T:   09/15/2008  Job:  [id number]   cc:   Mosetta Putt, M.D.  Blenda Nicely. [employer]

## 2011-01-08 NOTE — Discharge Summary (Signed)
NAMESHELSEY, Jenny Soto                ACCOUNT NO.:  [id number]   MEDICAL RECORD NO.:  [id number]          PATIENT TYPE:  INP   LOCATION:  3307                         FACILITY:  [facility]   PHYSICIAN:  Ines Bloomer, M.D. DATE OF BIRTH:  [birth year]   DATE OF ADMISSION:  08/12/2008  DATE OF DISCHARGE:  08/15/2008                               DISCHARGE SUMMARY   FINAL DIAGNOSES:  Right lower lobe nodule, history of renal cell cancer.  Positive metastatic renal cell cancer.   SECONDARY DIAGNOSES:  1. History of renal cell cancer.  2. Status post left radical nephrectomy.  3. Hypercholesterolemia.  4. Status post splenectomy.   IN-HOSPITAL OPERATIONS AND PROCEDURES:  Right video-assisted  thoracoscopic surgery with right mini thoracotomy, wedge resection of  right lower lobe with lymph node biopsy.   HISTORY AND PHYSICAL AND HOSPITAL COURSE:  The patient is a 60 year old  female who had a left nephrectomy and splenectomy in October 2006 for  renal cell cancer.  Serial CT scan showed a 6-mm nodule in the right  lower lobe that was a ground-glass appearance.  There is also noted to  be a left upper lobe nodule, but on subsequent CT this area had  resolved.  The patient underwent PET scan, which showed no evidence of  uptake in the right lower lobe lesion.  Her pulmonary function test was  2.94 with an FEV-1 of 2.39.  She had no hemoptysis, fever, chills or  extra weight loss.  A followup serial CT scan showed an increased size  in the right lower lobe mass from 6 mm to 8 x 10 mm.  Dr. Edwyna Shell  discussed with the patient, removal of this nodule.  He discussed risks  and benefits with the patient.  The patient nods understanding and  agreed to proceed.  Surgery was scheduled for August 12, 2008.   For details of the patient's past medical history and physical exam,  please see dictated H and P.   The patient was taken to the operating room on August 12, 2008, where  she underwent  right video-assisted thoracoscopic surgery with right mini  thoracotomy, wedge resection of right lower lobe with lymph node biopsy.  The patient tolerated this procedure well and was transferred to the  intensive care unit in stable condition.  Postoperatively the patient  was noted to be hemodynamically stable.  She was able to be extubated  following surgery.  Post extubation the patient noted to be alert and  oriented x4.  Neuro intact.  The patient's postoperative course is  pretty much unremarkable.  The patient underwent daily chest x-rays.  All x-rays remained stable with no pneumothorax.  The patient had no air  leak noted.  Chest tube was placed __________ on postop day #1 with  removal of chest tube done postop day #2.  Followup chest x-ray, postop  day #3, remained clear and stable with no pneumothorax.  During this  time, the patient was encouraged to use her incentive spirometer.  She  is able to be weaned off oxygen with sats greater than 90% on  room air.  Currently, the patient's final pathology report is pending, but her  frozen pathology report showed positive for metastatic renal cell  cancer.  During the patient's postoperative course, vital signs were  monitored.  She remained afebrile in normal sinus rhythm.  Blood  pressure is stable.  All incisions were noted to be clean, dry, and  intact and healing well.  The patient was up ambulating well without  difficulty.  She was tolerating diet well.  No nausea or vomiting noted.  The patient remained hemodynamically stable.   On August 15, 2008, the patient continued to progress well.  It was  felt that she was stable and ready for discharge to home on August 15, 2008, postop day #3.   Last labs obtained on August 14, 2008, showed a sodium of 137,  potassium 3.7, chloride of 103, bicarb of 25, BUN of 4, creatinine of  0.94, and glucose of 122.  White blood cell count is 10.3, hemoglobin is  9.9, hematocrit 30.8,  and platelet count 197.   FOLLOWUP APPOINTMENTS:  A followup appointment has been arranged with  Dr. Edwyna Shell for August 24, 2008, at 12:45 p.m.  The patient need to  obtain PA and lateral chest x-ray 40 minutes prior to this appointment.   ACTIVITY:  The patient is instructed no driving until released to do so.  No lifting over 10 pounds.  She is told to ambulate 3-4 times per day,  progress as tolerated and to continue her breathing exercises.   DIET:  The patient educated on diet to be low-fat, low-salt.   INCISIONAL CARE:  The patient is told to shower, washing her incisions  using soap and water.  She is to contact the office if she develops any  drainage or opening from any of her incision sites.   DISCHARGE MEDICATIONS:  1. Lovastatin 20 mg at night.  2. Nu-Iron 150 mg daily.  3. Percocet 5/325 one to two tablets q.4-6 h. p.r.n.       Theda [place], [place]      Ines Bloomer, M.D.  Electronically Signed    KMD/MEDQ  D:  08/15/2008  T:  08/16/2008  Job:  [id number]

## 2011-01-08 NOTE — Letter (Signed)
October 26, 2008   Jenny Soto. Shadad  [address] [place]  [place]  [place] [address]   Re:  [name], [name]                DOB:  [birth year]   Dear Dr. Clelia Croft:   I saw the patient in the office today.  She is doing remarkably well  after her surgery.  She is back to full activities.  Her chest x-ray  showed normal postoperative changes.  I understand that she will get a  CT scan in 2 weeks.  I will refer her back to you for long-term  followup.  I would be happy to see her again if she develops any further  pulmonary problems.  Her blood pressure is 143/85, pulse 89,  respirations 18, and sats were 99%.   Ines Bloomer, M.D.  Electronically Signed   DPB/MEDQ  D:  10/26/2008  T:  10/26/2008  Job:  [id number]

## 2011-01-08 NOTE — Letter (Signed)
August 03, 2008   Mosetta Putt, MD  [address] [place], [place] [address]   Re:  Jenny Soto, FEDIE                  DOB:  June 01, 1951   Dear [name]:   I saw the patient back today and we reviewed her CT scan, which  unfortunately showed a right lower lobe lesion that had increased in  size, it is now 8 x 10 mm and was 7 x 8 mm.  Because this is an obvious  increase in size, even though the path was negative, I have recommended  resection.  We planned to do this on August 12, 2008, at [facility]  using the VATS approach.  I explained the risks of procedure to the  patient and her husband.  They agreed for the procedure.  Blood pressure  was 126/78, pulse 100, respirations 18, and sats were 98%.   Ines Bloomer, M.D.  Electronically Signed   DPB/MEDQ  D:  08/03/2008  T:  08/03/2008  Job:  [id number]

## 2011-01-11 NOTE — Discharge Summary (Signed)
Jenny Soto, Jenny Soto                ACCOUNT NO.:  [id number]   MEDICAL RECORD NO.:  [id number]          PATIENT TYPE:  OUT   LOCATION:  XRAY                         FACILITY:  [facility]   PHYSICIAN:  [name], M.D.  DATE OF BIRTH:  [birth year]   DATE OF ADMISSION:  06/10/2005  DATE OF DISCHARGE:  06/14/2005                                 DISCHARGE SUMMARY   ADMISSION DIAGNOSES:  Left renal tumor.   DISCHARGE DIAGNOSES:  1.  Status post left radical nephrectomy.  2.  Status post splenectomy.   HISTORY OF PRESENT ILLNESS:  Jenny Soto is a 60 year old Caucasian female  who presented with left flank pain, question of hematuria.  She had no  weight loss, no nausea.  CT scan revealed a very large, approximately 13 cm  renal tumor, highly suspicious for renal cell carcinoma.  No metastatic  disease was noted on CT scan.   PAST MEDICAL HISTORY:  Benign.   PAST SURGICAL HISTORY:  1.  Ruptured ectopic left salpingectomy, tubal ligation.  2.  Rhinoplasty and laser removal of vocal cord nodules.  3.  Dermoid removal left ovaries.  4.  Uterine ablation.   ALLERGIES:  None.   HOSPITAL COURSE:  On June 10, 2005, Jenny Soto was electively admitted  to [facility] under the care of Dr. Darvin Neighbours.  She  underwent the problem surgical procedures.  She had the colon left medical  nephrectomy and incidental splenectomy performed by Dr. Jerelene Redden.  She  tolerated the procedure well and transferred in stable condition to the  recovery room.  She was extubated immediately following surgery, and awoke  from surgery without difficulty.   On postoperative day, vital signs remained stable, Foley catheter patent,  drained clear yellow urine.  Hemoglobin 11.5, hematocrit 33.0, white count  11.3.  Sodium 138, potassium 3.3, chloride 104, CO2 27, glucose 211, BUN 6,  creatinine 0.9.  YB K-Ciel replacement given.  On postop day #1, vital signs  remained stable.  Pain  relieved well with PCA morphine.  Abdomen remained  soft.  Urine clear.  Adequate eyes and nose.  Hemoglobin 10.1, hematocrit  29.7, white count 11.2, platelets 231,000.  Sodium 129, potassium 4.6,  chloride 98, CO2 25, glucose 72, BUN 6, creatinine 1.0.  IV fluids changed  to D-5 normal saline.  The patient ambulating well.  Postop day #2, remained  febrile, vital signs stable, abdomen slightly distended, soft with  hypoactive bowel sounds.  Eyes and nose remained adequate, ___________ for  12 hours without nausea or vomiting.  Hemoglobin 9.1, hematocrit 26.5, white  count 13.9, sodium 129, potassium 4.0, chloride 94, CO2 28, glucose 132, BUN  5, creatinine 1.0.  Continue ____________ saline, NG tube and Foley  discontinue.  On postop day #3, T-max of 100.3, however, returned to 99.0.  Vital signs are stable.  Chest with sporadic rales at bases, continuing to  ambulate.  Also, passed two BMs.  Is tolerating clear liquid diet.  H&H 9.2  and 25.8.  Sodium 133, potassium 3.9, chloride 100, CO2 29, glucose 133, BUN  8, creatinine 1.0, ___________ 1.1.  On postop day #4, vital signs remained  stable.  She is tolerating a regular diet.  Abdomen is soft.  Bowels are  moving.  The patient is discharged home.  General Surgery also continued to  follow patient's progression as well.   PATHOLOGY:  1.  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  Surgical margins are negative. One lymph node studied, negative for      carcinoma.  2.  Spleen shift nonsplenic parenchyma.   CONDITION ON DISCHARGE:  Improved.   DISCHARGE MEDICATIONS:  1.  Vicodin 1-2 q.4-6 h p.r.n.  2.  Iron 325 mg p.o. t.i.d.   DISCHARGE INSTRUCTIONS:  1.  Activity:  No lifting greater than 10 pounds.  No driving a car and      limit long rides.  No strenuous exercise.  Limits to her climbing.  2.  Diet:  As tolerated.  3.  Wound care:  Shower, keep incision clean and dry.   FOLLOWUP:   Follow up in one week with [facility].  The patient will need to undergo vaccination, Pneumovax 23  every 6 years, HIV x1, ____________, meningococcal every 3-5 years and  influenza annually.  Vaccinations:  None given while hospitalized.     ______________________________  Alessandra Bevels. Chase Picket, FNP-C      [name], M.D.  Electronically Signed    JML/MEDQ  D:  07/03/2005  T:  07/04/2005  Job:  [id number]   cc:   Malva Limes, M.D.  Fax: [phone]   Mosetta Putt, M.D.  Fax: [phone]

## 2011-01-11 NOTE — Op Note (Signed)
NAMENGUYEN, BUTLER                ACCOUNT NO.:  [id number]   MEDICAL RECORD NO.:  [id number]          PATIENT TYPE:  AMB   LOCATION:  SDC                           FACILITY:  [facility]   PHYSICIAN:  Malva Limes, M.D.    DATE OF BIRTH:  [birth year]   DATE OF PROCEDURE:  08/22/2004  DATE OF DISCHARGE:                                 OPERATIVE REPORT   PREOPERATIVE DIAGNOSIS:  Menorrhagia.   POSTOPERATIVE DIAGNOSIS:  Menorrhagia.   PROCEDURES:  1.  Dilation and curettage.  2.  Novasure endometrial ablation.   SURGEON:  Malva Limes, M.D.   ANESTHESIA:  MAC with paracervical block.   DRAINS:  None.   ANTIBIOTICS:  Ancef 1 g.   SPECIMENS:  Endometrial curettings sent to pathology.   COMPLICATIONS:  None.   ESTIMATED BLOOD LOSS:  Minimal.   PROCEDURE IN DETAIL:  The patient was taken to the operating room where she  was placed in the dorsal lithotomy position.  MAC anesthesia was  administered without complications.  She was then prepped with Hibiclens and  draped in the usual fashion for this procedure.  A sterile speculum was  placed in the vagina, 20 mL of 1% lidocaine was used for paracervical block.  The cervix was then grasped with a single-tooth tenaculum.  The cervical os  was serially dilated to a 25-French.  The uterus was sounded to 8 cm.  The  cervical length was then found to be 8 cm.  At this point, the endometrial  curettage was performed.  Following this, the EndoSure device was placed  into the uterine cavity and seated.  The width was found to be 4.2 cm.  The  cavity length was 5 cm.  A CO2 pressure test was performed and passed.  At  this point, the device was turned on for 75 seconds at a power of 116 watts.  The procedure was concluded without complications.  The patient was taken to  recovery room in stable condition.  Instrument, lap counts were correct x1.     Mark   MA/MEDQ  D:  08/22/2004  T:  08/22/2004  Job:  [id number]

## 2011-01-11 NOTE — Op Note (Signed)
NAMEGRAYLYN, Jenny Soto                ACCOUNT NO.:  [id number]   MEDICAL RECORD NO.:  [id number]          PATIENT TYPE:  INP   LOCATION:  0005                         FACILITY:  [facility]   PHYSICIAN:  Gita Kudo, M.D. DATE OF BIRTH:  1950-12-29   DATE OF PROCEDURE:  DATE OF DISCHARGE:                                 OPERATIVE REPORT   INTRAOPERATIVE SURGICAL CONSULTATION AND SPLENECTOMY   OPERATIVE PROCEDURE:  Splenectomy.   SURGEON:  Gita Kudo, M.D.   ASSISTANT:  Lucrezia Starch. Earlene Plater, M.D.   DIAGNOSIS:  Splenic injury.   POSTOPERATIVE DIAGNOSIS:  Splenic injury, left renal carcinoma.   CLINICAL SUMMARY:  This 60 year old lady underwent a left renal nephrectomy  by Dr. Darvin Neighbours. There was a splenic capsular tear and he asked me to come  and evaluate it. I obtained good exposure by taking some adhesions down and  we could see the tear of the spleen on the anterior medial capsule which was  about 3-4 cm in size and there was actually some active bleeding beneath it.  In manipulating it, the capsular tear was quite evident and, although  initially I thought it could be repaired or placed in a Vicryl mesh, the  bleeding was significant enough, I thought, to warrant its resection and  therefore I did so with Dr. Earlene Plater' help.   PROCEDURE:  The patient already had her left nephrectomy and there was  excellent exposure. I took down the adhesions to the spleen, divided the  short gastrics with clamps and ties of 2-0 silk. Then the splenic column was  brought up, controlled with clamps, and ligated with 0 silk. The splenic  flexure had already been divided. In this manner, the spleen was removed  entirely. The bed of the spleen was hemostatic, having been controlled with  sutures or metal clips. The pancreas was not injured.   Then we spent a fair amount of time checking to make sure hemostasis was  good there and at the renal operative site. After lavaging with saline,  making  sure that the areas were hemostatic and covered with Surgicel, Dr.  Earlene Plater performed the closure.           ______________________________  Gita Kudo, M.D.     MRL/MEDQ  D:  06/10/2005  T:  06/10/2005  Job:  [id number]

## 2011-01-11 NOTE — Op Note (Signed)
[name], [name]                ACCOUNT NO.:  [id number]   MEDICAL RECORD NO.:  [id number]          PATIENT TYPE:  INP   LOCATION:  0005                         FACILITY:  [facility]   PHYSICIAN:  [name], M.D.  DATE OF BIRTH:  [birth year]   DATE OF PROCEDURE:  DATE OF DISCHARGE:                                 OPERATIVE REPORT   DIAGNOSIS:  Large left renal tumor.   OPERATIVE PROCEDURE:  1.  Left radical nephrectomy by Dr. Gaynelle Arabian.  2.  Incidental splenectomy by Dr. Jerelene Redden.   ASSISTANT:  Maretta Bees. Vonita Moss, M.D.   ANESTHESIA:  General endotracheal.   ESTIMATED BLOOD LOSS:  450 mL.   TUBES:  A 16-French Foley.   COMPLICATIONS:  None.   INDICATION FOR THE PROCEDURE:  Jenny Soto is a lovely 60 year old white  female who presented with flank pain and tumoral lethargy and questionable  hematuria.  She underwent a CT scan, which revealed a very large  approximately 13 cm renal tumor highly suspicious for renal cell carcinoma.  There was no spread noted and metastatic work-up was negative.  After  understanding the risks, benefits, and alternatives, she elected to proceed  with left radical nephrectomy.   PROCEDURE IN DETAIL:  The patient was placed in supine position.  After  proper general endotracheal anesthesia, she was placed in the left flank  anterior position, padded properly, and prepped and draped with Betadine in  sterile fashion.  A left subcostal incision was made and extended to the tip  of the 12th rib and across the midline.  Various layers were incised, the  external oblique, the anterior rectus sheath, posterior rectus sheath,  internal oblique, transversus abdominis.  The peritoneum was entered and  exploratory laparotomy was performed and the very large tumor could be seen  going superiorly but no other palpable abnormalities were noted.  The line  of Toldt was incised laterally and the colon was reflected.  Dissection was  carried.  The  entire splenic flexure was taken down.  The spleen was  markedly anterior due to the huge size of the tumor pushing it up.  A plane  was created external to Gerota's fascia and dissection was carried down  inferiorly.  The lower pole was taken down as was the gonadal vessels and  the ureter, which was clipped proximally and distally with hemoclips and  cut.  Dissection was carried lateral to the aorta.  On the aortic wall,  dissection was carried to the hilum.  The renal vein was quite large and was  dissected free at the level of the lateral border of the aorta.  The renal  artery was identified just superior and coming laterally off of the aorta.  It was ligated proximally and distally with #1 silk followed by Hem-o-lok  clips and cut and the vein was ligated in a similar manner with #1 silks,  two at the level of the vena cava and also a hemoclip.  The superior portion  of the kidney was noted to be pushed markedly superior.  Dissection was  carried  down.  The adrenal gland dissection was carried inferior to the  inferior border of the adrenal gland.  It was taken down and bleeders were  clipped.  Next, the pancreas was taken down and the tumor was removed and  submitted to pathology.  Thorough irrigation was performed.  Good hemostasis  was noted to be present.  Surgicel was placed on the inferior border of the  adrenal gland.  There was no significant bleeding.  She rally had a very low  blood loss.  Again, the plane had remained just external to Gerota's fascia  well underneath the spleen.  Inspection of the spleen revealed approximately  a 5 mm tiny tear on the splenic capsule anteriorly.  I asked Dr. Jerelene Redden to see it, place Surgicel on it, and hold pressure.  When Casimiro Needle  visualized it, the spleen was noted to be highly friable and was tearing,  probably due to its position and adhesions, and he felt as will be dictated  in his note that splenectomy was needed.  Splenectomy  was performed and  again hemostasis was noted to be present.  Irrigation was performed.  The  colon and bowel were placed in the left lower quadrant with the bowel  anterior to it.  The omentum was placed anteriorly.  Thorough irrigation was  performed.  The posterior and rectus sheath, transversus abdominis, and  internal oblique were closed with running #1 PDS in one layer and the  anterior rectus sheath and fascia of the external oblique were closed in a  second running #1 PDS layer.  Thorough irrigation was performed and good  hemostasis was noted to be present.  The skin was closed with skin staples  and the patient was taken to the recovery room stable.      [name], M.D.  Electronically Signed     RLD/MEDQ  D:  06/10/2005  T:  06/10/2005  Job:  [id number]

## 2011-05-09 ENCOUNTER — Other Ambulatory Visit: Payer: Self-pay | Admitting: Oncology

## 2011-05-09 ENCOUNTER — Encounter (HOSPITAL_BASED_OUTPATIENT_CLINIC_OR_DEPARTMENT_OTHER): Payer: BC Managed Care – PPO | Admitting: Oncology

## 2011-05-09 ENCOUNTER — Ambulatory Visit (HOSPITAL_COMMUNITY)
Admission: RE | Admit: 2011-05-09 | Discharge: 2011-05-09 | Disposition: A | Discharge status: Home / Self Care | Payer: BC Managed Care – PPO | Source: Ambulatory Visit | Attending: Oncology | Admitting: Oncology

## 2011-05-09 DIAGNOSIS — C649 Malignant neoplasm of unspecified kidney, except renal pelvis: Secondary | ICD-10-CM

## 2011-05-09 DIAGNOSIS — C7800 Secondary malignant neoplasm of unspecified lung: Secondary | ICD-10-CM | POA: Insufficient documentation

## 2011-05-09 DIAGNOSIS — K8689 Other specified diseases of pancreas: Secondary | ICD-10-CM | POA: Insufficient documentation

## 2011-05-09 DIAGNOSIS — K8020 Calculus of gallbladder without cholecystitis without obstruction: Secondary | ICD-10-CM | POA: Insufficient documentation

## 2011-05-09 DIAGNOSIS — K7689 Other specified diseases of liver: Secondary | ICD-10-CM | POA: Insufficient documentation

## 2011-05-09 DIAGNOSIS — Z902 Acquired absence of lung [part of]: Secondary | ICD-10-CM | POA: Insufficient documentation

## 2011-05-09 DIAGNOSIS — R1011 Right upper quadrant pain: Secondary | ICD-10-CM | POA: Insufficient documentation

## 2011-05-09 DIAGNOSIS — Z905 Acquired absence of kidney: Secondary | ICD-10-CM | POA: Insufficient documentation

## 2011-05-09 DIAGNOSIS — K5730 Diverticulosis of large intestine without perforation or abscess without bleeding: Secondary | ICD-10-CM | POA: Insufficient documentation

## 2011-05-09 DIAGNOSIS — Z9089 Acquired absence of other organs: Secondary | ICD-10-CM | POA: Insufficient documentation

## 2011-05-09 DIAGNOSIS — N289 Disorder of kidney and ureter, unspecified: Secondary | ICD-10-CM | POA: Insufficient documentation

## 2011-05-09 DIAGNOSIS — R918 Other nonspecific abnormal finding of lung field: Secondary | ICD-10-CM

## 2011-05-09 LAB — CMP (CANCER CENTER ONLY)
ALT(SGPT): 26 U/L (ref 10–47)
Albumin: 3.7 g/dL (ref 3.3–5.5)
CO2: 25 mEq/L (ref 18–33)
Calcium: 9.3 mg/dL (ref 8.0–10.3)
Chloride: 105 mEq/L (ref 98–108)
Glucose, Bld: 111 mg/dL (ref 73–118)
Potassium: 5.3 mEq/L — ABNORMAL HIGH (ref 3.3–4.7)
Sodium: 144 mEq/L (ref 128–145)
Total Bilirubin: 0.6 mg/dl (ref 0.20–1.60)
Total Protein: 7.5 g/dL (ref 6.4–8.1)

## 2011-05-09 LAB — CBC WITH DIFFERENTIAL/PLATELET
Basophils Absolute: 0.1 10*3/uL (ref 0.0–0.1)
Eosinophils Absolute: 0.2 10*3/uL (ref 0.0–0.5)
HCT: 40.4 % (ref 34.8–46.6)
LYMPH%: 40.5 % (ref 14.0–49.7)
MONO#: 0.7 10*3/uL (ref 0.1–0.9)
NEUT#: 2.8 10*3/uL (ref 1.5–6.5)
NEUT%: 43.9 % (ref 38.4–76.8)
Platelets: 294 10*3/uL (ref 145–400)
WBC: 6.3 10*3/uL (ref 3.9–10.3)

## 2011-05-09 LAB — LACTATE DEHYDROGENASE: LDH: 167 U/L (ref 94–250)

## 2011-05-09 MED ORDER — IOHEXOL 300 MG/ML  SOLN
100.0000 mL | Freq: Once | INTRAMUSCULAR | Status: AC | PRN
  Administered 2011-05-09: 100 mL via INTRAVENOUS

## 2011-05-10 ENCOUNTER — Encounter (HOSPITAL_BASED_OUTPATIENT_CLINIC_OR_DEPARTMENT_OTHER): Payer: BC Managed Care – PPO | Admitting: Oncology

## 2011-05-10 DIAGNOSIS — C801 Malignant (primary) neoplasm, unspecified: Secondary | ICD-10-CM

## 2011-05-10 DIAGNOSIS — C7800 Secondary malignant neoplasm of unspecified lung: Secondary | ICD-10-CM

## 2011-05-10 DIAGNOSIS — R1012 Left upper quadrant pain: Secondary | ICD-10-CM

## 2011-05-10 DIAGNOSIS — C649 Malignant neoplasm of unspecified kidney, except renal pelvis: Secondary | ICD-10-CM

## 2011-05-27 ENCOUNTER — Encounter (INDEPENDENT_AMBULATORY_CARE_PROVIDER_SITE_OTHER): Payer: Self-pay | Admitting: General Surgery

## 2011-05-27 ENCOUNTER — Ambulatory Visit (INDEPENDENT_AMBULATORY_CARE_PROVIDER_SITE_OTHER): Payer: BC Managed Care – PPO | Admitting: General Surgery

## 2011-05-27 ENCOUNTER — Other Ambulatory Visit (INDEPENDENT_AMBULATORY_CARE_PROVIDER_SITE_OTHER): Payer: Self-pay | Admitting: General Surgery

## 2011-05-27 ENCOUNTER — Encounter (HOSPITAL_COMMUNITY): Payer: BC Managed Care – PPO

## 2011-05-27 VITALS — BP 142/88 | HR 68 | Temp 97.8°F | Resp 12 | Ht 64.0 in | Wt 157.8 lb

## 2011-05-27 DIAGNOSIS — C649 Malignant neoplasm of unspecified kidney, except renal pelvis: Secondary | ICD-10-CM | POA: Insufficient documentation

## 2011-05-27 DIAGNOSIS — K8020 Calculus of gallbladder without cholecystitis without obstruction: Secondary | ICD-10-CM

## 2011-05-27 LAB — COMPREHENSIVE METABOLIC PANEL
ALT: 21 U/L (ref 0–35)
AST: 22 U/L (ref 0–37)
Albumin: 4 g/dL (ref 3.5–5.2)
CO2: 28 mEq/L (ref 19–32)
Calcium: 10.2 mg/dL (ref 8.4–10.5)
Chloride: 103 mEq/L (ref 96–112)
Creatinine, Ser: 0.89 mg/dL (ref 0.50–1.10)
Sodium: 139 mEq/L (ref 135–145)

## 2011-05-27 LAB — CBC
HCT: 42 % (ref 36.0–46.0)
MCHC: 32.4 g/dL (ref 30.0–36.0)
Platelets: 304 10*3/uL (ref 150–400)
RDW: 14.1 % (ref 11.5–15.5)
WBC: 7.2 10*3/uL (ref 4.0–10.5)

## 2011-05-27 LAB — DIFFERENTIAL
Basophils Absolute: 0.1 10*3/uL (ref 0.0–0.1)
Basophils Relative: 1 % (ref 0–1)
Lymphocytes Relative: 34 % (ref 12–46)
Monocytes Absolute: 0.7 10*3/uL (ref 0.1–1.0)
Neutro Abs: 3.7 10*3/uL (ref 1.7–7.7)

## 2011-05-27 LAB — SURGICAL PCR SCREEN: Staphylococcus aureus: INVALID — AB

## 2011-05-27 NOTE — Progress Notes (Signed)
Chief Complaint  Patient presents with  . Other    NP- eval GB    HPI Jenny Soto is a 60 y.o. female.   HPI This is a 59 year old female with a long surgical history. She in 2006 underwent a left nephrectomy for renal cell cancer that was also complicated by a splenectomy. She did well after this but 3 years later she had a recurrence in her right lung for which she underwent a wedge resection in her right lung. She has been followed by Dr. Clelia Croft and has done well since then and has had no recurrence of her tumor. She does however over the last number of months had increasing right upper quadrant pain that first began intermittently that now is present much of the time. This is related to eating some fatty or greasy foods. She's had some nausea but no emesis. She has had known gallbladder disease for some time but due to the worsening of symptoms and nothing really relieving this at this point she is elected to be evaluated.  Past Medical History  Diagnosis Date  . Hyperlipidemia   . Gallstones   . Cancer     kidney    Past Surgical History  Procedure Date  . Ectopic pregnancy surgery 1975  . Tubal ligation 1983  . Polypectomy 1985    vocal cord  . Rhinoplasty 1985  . 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 Novasure ablation 2005  . Nephrectomy october 2006    left kidney  . Splenectomy, total october 2006  . Lobectomy 2009    right lower lung  . Bladder suspension july 2011 and nov 2011    Family History  Problem Relation Age of Onset  . Adopted: Yes    Social History History  Substance Use Topics  . Smoking status: Former Games developer  . Smokeless tobacco: Not on file  . Alcohol Use: Yes    No Known Allergies  No current outpatient prescriptions on file.    Review of Systems Review of Systems  Constitutional: Negative.   HENT: Negative.   Eyes: Negative.   Respiratory: Negative.   Cardiovascular: Negative.   Gastrointestinal: Negative.    Genitourinary: Negative.   Musculoskeletal: Negative.   Neurological: Negative.   Hematological: Negative.   Psychiatric/Behavioral: Negative.     Blood pressure 142/88, pulse 68, temperature 97.8 F (36.6 C), temperature source Temporal, resp. rate 12, height 5\' 4"  (1.626 m), weight 157 lb 12.8 oz (71.578 kg).  Physical Exam Physical Exam  Constitutional: She appears well-developed and well-nourished.  Eyes: No scleral icterus.  Neck: Neck supple.  Cardiovascular: Normal rate, regular rhythm and normal heart sounds.   Pulmonary/Chest: Effort normal and breath sounds normal. She has no wheezes. She has no rales.  Abdominal: Soft. Bowel sounds are normal. There is no hepatomegaly. There is tenderness in the right upper quadrant. There is negative Murphy's sign. No hernia.    Lymphadenopathy:    She has no cervical adenopathy.    Data Reviewed  *RADIOLOGY REPORT*  Clinical Data: Renal cell carcinoma diagnosed in 2006 and 2009.  Lung metastasis. Left nephrectomy. Splenectomy. Right lung  resection. Right upper quadrant pain.  CT CHEST, ABDOMEN AND PELVIS WITH CONTRAST  Technique: Contiguous axial images of the chest abdomen and pelvis  were obtained after IV contrast administration.  Contrast: 100 ml Omnipaque-300  Comparison: Plain film chest of 11/08/2010. CTs of 05/09/2011  CT CHEST  Findings: Lung windows demonstrate surgical changes  in the right  lower lobe without evidence of locally recurrent disease.  Minimal nodularity in the left upper lobe is unchanged on image 28.  Soft tissue windows demonstrate normal heart size without  pericardial or pleural effusion.  No mediastinal or hilar adenopathy.  IMPRESSION:  No acute process or evidence of metastatic disease in the chest.  CT ABDOMEN AND PELVIS  Findings: No change and tiny low density liver lesions, likely  cysts. Prominent right lobe is unchanged.  Prior splenectomy with residual splenic tissue in the left  upper  quadrant. Normal stomach. 7 mm calcified lesion within the  pancreatic tail is unchanged on image 55. Normal biliary tract,  adrenal glands. Dependent gallstones are suspected on coronal  image 28.  Too small to characterize upper pole right renal lesion, likely a  cyst. Approximate 6 mm.  Left nephrectomy without evidence of locally recurrent disease.  No retroperitoneal or retrocrural adenopathy.  Scattered colonic diverticula. Normal terminal ileum and appendix.  Normal small bowel without abdominal ascites.  No pelvic adenopathy. Normal urinary bladder. Central uterine  calcifications are again identified. No adnexal mass or  significant free pelvic fluid.  No acute osseous abnormality.  IMPRESSION:  1. No acute process or evidence of metastatic disease in the  abdomen or pelvis.  2. Status post left nephrectomy without locally recurrent disease.  3. Similar appearance of a peripherally calcified lesion in the  pancreatic tail. Likely a calcified pseudocyst versus less likely  a small splenic artery aneurysm.  4. Probable dystrophic uterine fibroid.  5. Cholelithiasis.  Original Report Authenticated By: Consuello Bossier, M.D.        Assessment    Symptomatic cholelithaisis    Plan    She has gallstones on her CT scan. She pretty clearly has symptoms referable to her gallstone also. I discussed with her my recommendation being a laparoscopic cholecystectomy. We discussed the cholangiogram if her labs are abnormal preoperatively. We discussed the risks of a laparoscopic cholecystectomy been but not limited to bleeding, infection, open procedure, injury to bowel or other vascular structure especially as she is going to have some adhesions present, cystic duct leak, and common bile duct injury. She is understandable of these risks and we'll proceed as soon as possible.       Harvie Morua 05/27/2011, 12:17 PM

## 2011-05-29 ENCOUNTER — Ambulatory Visit (HOSPITAL_COMMUNITY)
Admission: RE | Admit: 2011-05-29 | Discharge: 2011-05-29 | Disposition: A | Discharge status: Home / Self Care | Payer: BC Managed Care – PPO | Source: Ambulatory Visit | Attending: General Surgery | Admitting: General Surgery

## 2011-05-29 ENCOUNTER — Other Ambulatory Visit (INDEPENDENT_AMBULATORY_CARE_PROVIDER_SITE_OTHER): Payer: Self-pay | Admitting: General Surgery

## 2011-05-29 DIAGNOSIS — K811 Chronic cholecystitis: Secondary | ICD-10-CM

## 2011-05-29 DIAGNOSIS — Z09 Encounter for follow-up examination after completed treatment for conditions other than malignant neoplasm: Secondary | ICD-10-CM

## 2011-05-29 DIAGNOSIS — K8010 Calculus of gallbladder with chronic cholecystitis without obstruction: Secondary | ICD-10-CM | POA: Insufficient documentation

## 2011-05-29 DIAGNOSIS — Z85528 Personal history of other malignant neoplasm of kidney: Secondary | ICD-10-CM | POA: Insufficient documentation

## 2011-05-29 DIAGNOSIS — Z01812 Encounter for preprocedural laboratory examination: Secondary | ICD-10-CM | POA: Insufficient documentation

## 2011-05-29 HISTORY — PX: LAPAROSCOPIC CHOLECYSTECTOMY: SUR755

## 2011-05-29 MED ORDER — PROMETHAZINE HCL 12.5 MG PO TABS: 12.5000 mg | ORAL_TABLET | Freq: Four times a day (QID) | ORAL | Status: AC | PRN

## 2011-05-30 LAB — MRSA CULTURE

## 2011-05-30 NOTE — Op Note (Signed)
Jenny Soto, Jenny Soto NO.:  [id number]  MEDICAL RECORD NO.:  [id number]  LOCATION:  DAYL                         FACILITY:  [facility]  PHYSICIAN:  Juanetta Gosling, MDDATE OF BIRTH:  January 28, 1951  DATE OF PROCEDURE:  05/29/2011 DATE OF DISCHARGE:  05/29/2011                              OPERATIVE REPORT   PREOPERATIVE DIAGNOSIS:  Symptomatic cholelithiasis.  POSTOPERATIVE DIAGNOSIS:  Symptomatic cholelithiasis.  PROCEDURE:  Laparoscopic cholecystectomy.  SURGEON:  Troy Sine. Dwain Sarna, M.D.  ASSISTANT:  Wilmon Arms. Tsuei, M.D.  ANESTHESIA:  General.  SPECIMENS:  Gallbladder contents sent to Pathology.  ESTIMATED BLOOD LOSS:  Minimal.  COMPLICATIONS:  None.  DRAINS:  None.  DISPOSITION:  To recovery room in stable condition.  INDICATIONS:  A 60 year old female with multiple prior operations and a history of renal cell cancer who was disease-free at this point.  She has had a gallstones by CT scan and has had increasing symptoms related to her gallbladder.  She and I discussed a laparoscopic cholecystectomy with the risks and benefits associated with that procedure.  After informed consent was obtained, the patient was taken to the operating room, she was administered 1 g of intravenous cefoxitin and sequential compression devices were placed on her lower extremities prior to induction with anesthesia.  She was then placed under general endotracheal anesthesia without complication.  Her abdomen was prepped and draped in the standard sterile surgical fashion.  Surgical time-out was then performed.  PROCEDURE: I infiltrated 0.25% Marcaine below her umbilicus, then I made a vertical incision carried this down to her fascia.  I grasped her umbilical stalk with a Kocher clamp, I entered her fascia sharply.  Her peritoneum was entered bluntly.  A 0-Vicryl pursestring suture was placed through the fascia and Hassan trocar was then introduced and the  abdomen was insufflated with 15 mmHg pressure.  Three further 5 mm ports were placed in the epigastric and right upper quadrant after infiltration with local anesthetic without complication under direct vision.  I then grasped the gallbladder and retracted as cephalad and lateral.  The gallbladder was fairly intrahepatic, I was able to release the peritoneum on either side eventually dissected the triangle of Calot to obtain the critical view of safety.  I clipped the duct and the artery and divided both of these. I then removed the gallbladder from the liver bed without any difficulty.  This was placed in an EndoCatch bag and removed from the umbilicus.  I then obtained hemostasis, irrigation was performed, this was all clear.  I then opened, I then removed my Hassan trocar, tied down my pursestring suture.  There was no evidence of an entry injury and this stitch obliterated the defect.  The abdomen was then desufflated.  All trocars were removed.  The incisions were closed with 4-0 Monocryl and Dermabond.  She tolerated this well, was extubated, and transferred to recovery room in stable condition.     Juanetta Gosling, MD     MCW/MEDQ  D:  05/29/2011  T:  05/30/2011  Job:  [id number]  cc:   Benjiman Core, M.D. Fax: [phone]  Sherron Monday, MD Fax: [phone]  Electronically Signed by  Tinsley Everman MD on 05/30/2011 05:17:00 PM

## 2011-05-31 LAB — BLOOD GAS, ARTERIAL
Acid-base deficit: 2.5 mmol/L — ABNORMAL HIGH (ref 0.0–2.0)
Bicarbonate: 24.5 mEq/L — ABNORMAL HIGH (ref 20.0–24.0)
Drawn by: 181601
Drawn by: 29743
FIO2: 0.21 %
O2 Content: 2 L/min
O2 Saturation: 98.9 %
O2 Saturation: 99.4 %
Patient temperature: 98.6
pCO2 arterial: 38.5 mmHg (ref 35.0–45.0)
pH, Arterial: 7.417 — ABNORMAL HIGH (ref 7.350–7.400)
pO2, Arterial: 104 mmHg — ABNORMAL HIGH (ref 80.0–100.0)

## 2011-05-31 LAB — COMPREHENSIVE METABOLIC PANEL
ALT: 15 U/L (ref 0–35)
AST: 29 U/L (ref 0–37)
Albumin: 2.7 g/dL — ABNORMAL LOW (ref 3.5–5.2)
Albumin: 4.4 g/dL (ref 3.5–5.2)
Alkaline Phosphatase: 40 U/L (ref 39–117)
CO2: 21 mEq/L (ref 19–32)
Calcium: 10.1 mg/dL (ref 8.4–10.5)
Chloride: 103 mEq/L (ref 96–112)
Creatinine, Ser: 1.03 mg/dL (ref 0.4–1.2)
GFR calc Af Amer: >60 mL/min (ref >60)
GFR calc non Af Amer: 56 mL/min — ABNORMAL LOW (ref >60)
Glucose, Bld: 122 mg/dL — ABNORMAL HIGH (ref 70–99)
Potassium: 3.7 mEq/L (ref 3.5–5.1)
Sodium: 137 mEq/L (ref 135–145)
Total Bilirubin: 0.6 mg/dL (ref 0.3–1.2)
Total Protein: 5.2 g/dL — ABNORMAL LOW (ref 6.0–8.3)
Total Protein: 7.2 g/dL (ref 6.0–8.3)

## 2011-05-31 LAB — CBC
HCT: 30.8 % — ABNORMAL LOW (ref 36.0–46.0)
Hemoglobin: 9.8 g/dL — ABNORMAL LOW (ref 12.0–15.0)
Hemoglobin: 9.9 g/dL — ABNORMAL LOW (ref 12.0–15.0)
MCHC: 32.8 g/dL (ref 30.0–36.0)
MCHC: 33 g/dL (ref 30.0–36.0)
MCV: 99 fL (ref 78.0–100.0)
Platelets: 309 10*3/uL (ref 150–400)
RDW: 13.4 % (ref 11.5–15.5)
RDW: 13.7 % (ref 11.5–15.5)
WBC: 10.3 10*3/uL (ref 4.0–10.5)

## 2011-05-31 LAB — URINALYSIS, ROUTINE W REFLEX MICROSCOPIC
Ketones, ur: NEGATIVE mg/dL
Nitrite: NEGATIVE
pH: 5.5 (ref 5.0–8.0)

## 2011-05-31 LAB — BASIC METABOLIC PANEL
BUN: 4 mg/dL — ABNORMAL LOW (ref 6–23)
Chloride: 100 mEq/L (ref 96–112)
Glucose, Bld: 131 mg/dL — ABNORMAL HIGH (ref 70–99)
Potassium: 3.5 mEq/L (ref 3.5–5.1)

## 2011-05-31 LAB — TYPE AND SCREEN

## 2011-05-31 LAB — APTT: aPTT: 27 seconds (ref 24–37)

## 2011-06-20 ENCOUNTER — Ambulatory Visit (INDEPENDENT_AMBULATORY_CARE_PROVIDER_SITE_OTHER): Payer: BC Managed Care – PPO | Admitting: General Surgery

## 2011-06-20 ENCOUNTER — Encounter (INDEPENDENT_AMBULATORY_CARE_PROVIDER_SITE_OTHER): Payer: Self-pay | Admitting: General Surgery

## 2011-06-20 VITALS — BP 110/78 | HR 72 | Ht 65.0 in | Wt 155.0 lb

## 2011-06-20 DIAGNOSIS — Z09 Encounter for follow-up examination after completed treatment for conditions other than malignant neoplasm: Secondary | ICD-10-CM

## 2011-06-20 NOTE — Progress Notes (Signed)
Subjective:     Patient ID: Jenny Soto, female   DOB: 10-Nov-1950, 60 y.o.   MRN: [id number]  HPI  This is a 60 year old female who had symptomatic cholelithiasis. She underwent a laparoscopic cholecystectomy recently. This has cured all of her preoperative symptoms. She has no real complaints at all now. Her incisions are healing and she is eating normally. She is back to most of her full normal activities also. Her pathology showed chronic cholecystitis and cholesterolosis. Review of Systems     Objective:   Physical Exam Well healing incisions without infection    Assessment:     S/p lap chole    Plan:     Return to full activity Return as needed

## 2011-09-21 ENCOUNTER — Telehealth: Payer: Self-pay | Admitting: Oncology

## 2011-09-21 NOTE — Telephone Encounter (Signed)
pt aware of 11/07/11 appt aom

## 2011-11-07 ENCOUNTER — Telehealth: Payer: Self-pay | Admitting: Oncology

## 2011-11-07 ENCOUNTER — Ambulatory Visit (HOSPITAL_BASED_OUTPATIENT_CLINIC_OR_DEPARTMENT_OTHER): Payer: BC Managed Care – PPO | Admitting: Oncology

## 2011-11-07 ENCOUNTER — Other Ambulatory Visit (HOSPITAL_BASED_OUTPATIENT_CLINIC_OR_DEPARTMENT_OTHER): Payer: BC Managed Care – PPO | Admitting: Lab

## 2011-11-07 VITALS — BP 130/81 | HR 88 | Temp 97.3°F | Ht 65.0 in | Wt 159.7 lb

## 2011-11-07 DIAGNOSIS — C649 Malignant neoplasm of unspecified kidney, except renal pelvis: Secondary | ICD-10-CM

## 2011-11-07 DIAGNOSIS — K8020 Calculus of gallbladder without cholecystitis without obstruction: Secondary | ICD-10-CM

## 2011-11-07 DIAGNOSIS — C7800 Secondary malignant neoplasm of unspecified lung: Secondary | ICD-10-CM

## 2011-11-07 DIAGNOSIS — C801 Malignant (primary) neoplasm, unspecified: Secondary | ICD-10-CM

## 2011-11-07 LAB — LACTATE DEHYDROGENASE: LDH: 187 U/L (ref 94–250)

## 2011-11-07 LAB — COMPREHENSIVE METABOLIC PANEL
Alkaline Phosphatase: 62 U/L (ref 39–117)
BUN: 16 mg/dL (ref 6–23)
Creatinine, Ser: 1.11 mg/dL — ABNORMAL HIGH (ref 0.50–1.10)
Glucose, Bld: 101 mg/dL — ABNORMAL HIGH (ref 70–99)
Sodium: 140 mEq/L (ref 135–145)
Total Bilirubin: 0.3 mg/dL (ref 0.3–1.2)
Total Protein: 7.2 g/dL (ref 6.0–8.3)

## 2011-11-07 LAB — CBC WITH DIFFERENTIAL/PLATELET
BASO%: 1 % (ref 0.0–2.0)
LYMPH%: 45.2 % (ref 14.0–49.7)
MCHC: 33.4 g/dL (ref 31.5–36.0)
MCV: 98.8 fL (ref 79.5–101.0)
MONO%: 7.5 % (ref 0.0–14.0)
Platelets: 302 10*3/uL (ref 145–400)
RBC: 4 10*6/uL (ref 3.70–5.45)
RDW: 14 % (ref 11.2–14.5)
WBC: 6.3 10*3/uL (ref 3.9–10.3)
nRBC: 0 % (ref 0–0)

## 2011-11-07 NOTE — Telephone Encounter (Signed)
gv pt appt for sept2013.  scheduled ct scan on 09/13 @ [facility]

## 2011-11-07 NOTE — Progress Notes (Signed)
Hematology and Oncology Follow Up Visit  Jenny Soto [id number] 10/10/1950 61 y.o. 11/07/2011 4:15 PM  Ines Bloomer, M.D.  [name], M.D.  [name], MD    Principle Diagnosis: A 61 year old female with advanced renal cell carcinoma initially presented in 2006.  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   Prior Therapy:  1. Status post nephrectomy in 2006.  However, she developed recurrent disease in 2009.  2. The patient underwent surgical resection, VATS procedure and wedge resection of the right lower lobe done in 2009.  Current therapy: Active surveillance.  Interim History: Jenny Soto presents today for a follow-up visit.  She continues to do very well without any really major problems.  She did under go a cholecystectomy last fall and did very well after that. She reports that her abdominal pain have resolved.  Did not report any fevers.  She has not reported any chills.  Performance status and activity level remain reasonable. She is not reporting pulmonary symptoms at this time.   Medications: I have reviewed the patient's current medications. No current outpatient prescriptions on file.  Allergies: No Known Allergies  Past Medical History, Surgical history, Social history, and Family History were reviewed and updated.  Review of Systems: Constitutional:  Negative for fever, chills, night sweats, anorexia, weight loss, pain. Cardiovascular: no chest pain or dyspnea on exertion Respiratory: negative Neurological: negative Dermatological: negative ENT: negative Skin: Negative. Gastrointestinal: no abdominal pain, change in bowel habits, or black or bloody stools Genito-Urinary: no dysuria, trouble voiding, or hematuria Hematological and Lymphatic: negative Breast: negative Musculoskeletal: negative Remaining ROS negative. Physical Exam: Blood pressure 130/81, pulse 88, temperature 97.3 F (36.3 C), temperature source Oral, height 5\' 5"   (1.651 m), weight 159 lb 11.2 oz (72.439 kg). ECOG: 0 General appearance: alert Head: Normocephalic, without obvious abnormality, atraumatic Neck: no adenopathy, no carotid bruit, no JVD, supple, symmetrical, trachea midline and thyroid not enlarged, symmetric, no tenderness/mass/nodules Lymph nodes: Cervical, supraclavicular, and axillary nodes normal. Heart:regular rate and rhythm, S1, S2 normal, no murmur, click, rub or gallop Lung:chest clear, no wheezing, rales, normal symmetric air entry Abdomin: soft, non-tender, without masses or organomegaly EXT:no erythema, induration, or nodules   Lab Results: Lab Results  Component Value Date   WBC 6.3 11/07/2011   HGB 13.2 11/07/2011   HCT 39.5 11/07/2011   MCV 98.8 11/07/2011   PLT 302 11/07/2011     Chemistry      Component Value Date/Time   NA 140 11/07/2011 1522   NA 144 05/09/2011 0929   K 4.2 11/07/2011 1522   K 5.3* 05/09/2011 0929   CL 104 11/07/2011 1522   CL 105 05/09/2011 0929   CO2 28 11/07/2011 1522   CO2 25 05/09/2011 0929   BUN 16 11/07/2011 1522   BUN 14 05/09/2011 0929   CREATININE 1.11* 11/07/2011 1522   CREATININE 1.0 05/09/2011 0929      Component Value Date/Time   CALCIUM 10.0 11/07/2011 1522   CALCIUM 9.3 05/09/2011 0929   ALKPHOS 62 11/07/2011 1522   ALKPHOS 55 05/09/2011 0929   AST 20 11/07/2011 1522   AST 30 05/09/2011 0929   ALT 16 11/07/2011 1522   BILITOT 0.3 11/07/2011 1522   BILITOT 0.60 05/09/2011 0929       Impression and Plan:  A 61 year old female with the following issues. 1. Renal cell carcinoma initially diagnosed at a T3 disease status post nephrectomy in 2006.  She had a  pulmonary metastasis resected in 2009.  She has continued to have stage IV NAD disease.  At this time unless she develops systemic disease, I will keep her off systemic therapy.  I think she will be an excellent candidate for systemic therapy if and when she develops measurable disease. I will repeat her CT scan in 6 months with follow  up visit then.  2. Right upper quadrant pain with cholelithiasis.  S/P cholecystectomy. Doing well from that stand point.  3. Hyperkalemia. Resolved now.   [facility], MD 3/14/20134:15 PM

## 2012-05-08 ENCOUNTER — Other Ambulatory Visit (HOSPITAL_BASED_OUTPATIENT_CLINIC_OR_DEPARTMENT_OTHER): Payer: BC Managed Care – PPO | Admitting: Lab

## 2012-05-08 ENCOUNTER — Ambulatory Visit (HOSPITAL_COMMUNITY)
Admission: RE | Admit: 2012-05-08 | Discharge: 2012-05-08 | Disposition: A | Discharge status: Home / Self Care | Payer: BC Managed Care – PPO | Source: Ambulatory Visit | Attending: Oncology | Admitting: Oncology

## 2012-05-08 DIAGNOSIS — Z902 Acquired absence of lung [part of]: Secondary | ICD-10-CM | POA: Insufficient documentation

## 2012-05-08 DIAGNOSIS — C649 Malignant neoplasm of unspecified kidney, except renal pelvis: Secondary | ICD-10-CM

## 2012-05-08 DIAGNOSIS — Z905 Acquired absence of kidney: Secondary | ICD-10-CM | POA: Insufficient documentation

## 2012-05-08 DIAGNOSIS — Z9089 Acquired absence of other organs: Secondary | ICD-10-CM | POA: Insufficient documentation

## 2012-05-08 LAB — CBC WITH DIFFERENTIAL/PLATELET
Basophils Absolute: 0.1 10*3/uL (ref 0.0–0.1)
Eosinophils Absolute: 0.2 10*3/uL (ref 0.0–0.5)
HCT: 40.7 % (ref 34.8–46.6)
HGB: 13.8 g/dL (ref 11.6–15.9)
LYMPH%: 43.8 % (ref 14.0–49.7)
MONO#: 0.6 10*3/uL (ref 0.1–0.9)
NEUT#: 2.1 10*3/uL (ref 1.5–6.5)
NEUT%: 39.1 % (ref 38.4–76.8)
Platelets: 264 10*3/uL (ref 145–400)
WBC: 5.3 10*3/uL (ref 3.9–10.3)
lymph#: 2.3 10*3/uL (ref 0.9–3.3)

## 2012-05-08 LAB — COMPREHENSIVE METABOLIC PANEL (CC13)
ALT: 20 U/L (ref 0–55)
CO2: 23 mEq/L (ref 22–29)
Calcium: 10.2 mg/dL (ref 8.4–10.4)
Chloride: 107 mEq/L (ref 98–107)
Creatinine: 1 mg/dL (ref 0.6–1.1)
Glucose: 104 mg/dl — ABNORMAL HIGH (ref 70–99)
Total Bilirubin: 0.7 mg/dL (ref 0.20–1.20)

## 2012-05-08 MED ORDER — IOHEXOL 300 MG/ML  SOLN
100.0000 mL | Freq: Once | INTRAMUSCULAR | Status: AC | PRN
  Administered 2012-05-08: 100 mL via INTRAVENOUS

## 2012-05-12 ENCOUNTER — Ambulatory Visit (HOSPITAL_BASED_OUTPATIENT_CLINIC_OR_DEPARTMENT_OTHER): Payer: BC Managed Care – PPO | Admitting: Oncology

## 2012-05-12 VITALS — BP 119/82 | HR 81 | Temp 97.8°F | Resp 20 | Ht 65.0 in | Wt 153.2 lb

## 2012-05-12 DIAGNOSIS — C7800 Secondary malignant neoplasm of unspecified lung: Secondary | ICD-10-CM

## 2012-05-12 DIAGNOSIS — R1011 Right upper quadrant pain: Secondary | ICD-10-CM

## 2012-05-12 DIAGNOSIS — C649 Malignant neoplasm of unspecified kidney, except renal pelvis: Secondary | ICD-10-CM

## 2012-05-12 NOTE — Progress Notes (Signed)
Hematology and Oncology Follow Up Visit  Jenny Soto [id number] Nov 07, 1950 61 y.o. 05/12/2012 3:11 PM  Jenny Soto, M.D.  Jenny Soto, M.D.  [name], MD    Principle Diagnosis: A 61 year old female with advanced renal cell carcinoma initially presented in 2006.  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  Prior Therapy:  1. Status post nephrectomy in 2006.  However, she developed recurrent disease in 2009.  2. The patient underwent surgical resection, VATS procedure and wedge resection of the right lower lobe done in 2009.  Current therapy: Active surveillance.  Interim History: Mrs. Jenny Soto presents today for a follow-up visit.  She continues to do very well without any really major problems. She reports that her abdominal pain have resolved.  Did not report any fevers.  She has not reported any chills.  Performance status and activity level remain reasonable. She is not reporting pulmonary symptoms at this time. No urinary symptoms on today's visit either.   Medications: I have reviewed the patient's current medications. No current outpatient prescriptions on file.  Allergies: No Known Allergies  Past Medical History, Surgical history, Social history, and Family History were reviewed and updated.  Review of Systems: Constitutional:  Negative for fever, chills, night sweats, anorexia, weight loss, pain. Cardiovascular: no chest pain or dyspnea on exertion Respiratory: negative Neurological: negative Dermatological: negative ENT: negative Skin: Negative. Gastrointestinal: no abdominal pain, change in bowel habits, or black or bloody stools Genito-Urinary: no dysuria, trouble voiding, or hematuria Hematological and Lymphatic: negative Breast: negative Musculoskeletal: negative Remaining ROS negative. Physical Exam: Blood pressure 119/82, pulse 81, temperature 97.8 F (36.6 C), temperature source Oral, resp. rate 20, height 5\' 5"  (1.651 m), weight  153 lb 3.2 oz (69.491 kg). ECOG: 0 General appearance: alert Head: Normocephalic, without obvious abnormality, atraumatic Neck: no adenopathy, no carotid bruit, no JVD, supple, symmetrical, trachea midline and thyroid not enlarged, symmetric, no tenderness/mass/nodules Lymph nodes: Cervical, supraclavicular, and axillary nodes normal. Heart:regular rate and rhythm, S1, S2 normal, no murmur, click, rub or gallop Lung:chest clear, no wheezing, rales, normal symmetric air entry Abdomin: soft, non-tender, without masses or organomegaly EXT:no erythema, induration, or nodules   Lab Results: Lab Results  Component Value Date   WBC 5.3 05/08/2012   HGB 13.8 05/08/2012   HCT 40.7 05/08/2012   MCV 101.1* 05/08/2012   PLT 264 05/08/2012     Chemistry      Component Value Date/Time   NA 140 05/08/2012 1113   NA 140 11/07/2011 1522   NA 144 05/09/2011 0929   K 4.9 05/08/2012 1113   K 4.2 11/07/2011 1522   K 5.3* 05/09/2011 0929   CL 107 05/08/2012 1113   CL 104 11/07/2011 1522   CL 105 05/09/2011 0929   CO2 23 05/08/2012 1113   CO2 28 11/07/2011 1522   CO2 25 05/09/2011 0929   BUN 11.0 05/08/2012 1113   BUN 16 11/07/2011 1522   BUN 14 05/09/2011 0929   CREATININE 1.0 05/08/2012 1113   CREATININE 1.11* 11/07/2011 1522   CREATININE 1.0 05/09/2011 0929      Component Value Date/Time   CALCIUM 10.2 05/08/2012 1113   CALCIUM 10.0 11/07/2011 1522   CALCIUM 9.3 05/09/2011 0929   ALKPHOS 65 05/08/2012 1113   ALKPHOS 62 11/07/2011 1522   ALKPHOS 55 05/09/2011 0929   AST 21 05/08/2012 1113   AST 20 11/07/2011 1522   AST 30 05/09/2011 0929   ALT 20 05/08/2012 1113  ALT 16 11/07/2011 1522   BILITOT 0.70 05/08/2012 1113   BILITOT 0.3 11/07/2011 1522   BILITOT 0.60 05/09/2011 0929     CT CHEST, ABDOMEN AND PELVIS WITH CONTRAST  Technique: Multidetector CT imaging of the chest, abdomen and  pelvis was performed following the standard protocol during bolus  administration of intravenous contrast.  Contrast:  OMNIPAQUE IOHEXOL 300 MG/ML SOLN  Comparison: 05/09/2011  CT CHEST  Findings: There is no axillary, mediastinal, or hilar  lymphadenopathy. Heart size is normal. No pericardial or pleural  effusion.  Stable appearance of suture line and scarring in the posterior  right lower lobe. No pulmonary parenchymal nodule or mass. No  focal airspace consolidation.  Bone windows reveal no worrisome lytic or sclerotic osseous  lesions.  IMPRESSION:  Stable. No evidence for metastatic disease in the chest.  CT ABDOMEN AND PELVIS  Findings: No focal abnormalities seen in the liver. The spleen is  surgically absent. The stomach, duodenum, pancreas, adrenal  glands, and abdominal bowel loops are unremarkable. 7 mm of low  density lesion in the upper pole of the right kidney is stable  since the prior study and has measured 6 mm on an exam from  05/15/2009. Left kidney is surgically absent. Soft tissue nodule  adjacent to the pancreatic tail is unchanged since 2010. Tiny  calcification in the pancreatic tail is also stable.  No abdominal aortic aneurysm. No retroperitoneal lymphadenopathy.  Abdominal bowel loops are normal in appearance.  Imaging through the pelvis shows no free intraperitoneal fluid. No  pelvic sidewall lymphadenopathy. Uterus is unremarkable. There is  no adnexal mass. Bladder is not distended. Scattered diverticular  changes seen in the left colon without diverticulitis. The  terminal ileum and the appendix are normal.  Bone windows reveal no worrisome lytic or sclerotic osseous  lesions.  IMPRESSION:  Stable exam. Status post left nephrectomy. No evidence for new or  progressive disease to suggest recurrence.   Impression and Plan:  A 61 year old female with the following issues. 1. Renal cell carcinoma initially diagnosed at a T3 disease status post nephrectomy in 2006.  She had a pulmonary metastasis resected in 2009.  She has continued to have stage IV NAD disease with  her recent CT scan discussed today.  At this time unless she develops systemic disease, I will keep her off systemic therapy.  I think she will be an excellent candidate for systemic therapy if and when she develops measurable disease. I will repeat her CT scan in 12 months with follow up visit after 6 months.  2. Right upper quadrant pain with cholelithiasis.  S/P cholecystectomy. Doing well from that stand point.   [facility], MD 9/17/20133:11 PM

## 2012-05-13 ENCOUNTER — Telehealth: Payer: Self-pay | Admitting: Oncology

## 2012-05-13 NOTE — Telephone Encounter (Signed)
lmonvm for pt re appt for 3.19.14 and mailed schedule.

## 2012-05-20 ENCOUNTER — Encounter: Payer: Self-pay | Admitting: Dietician

## 2012-05-20 NOTE — Progress Notes (Signed)
Brief Out-patient Oncology Nutrition Note  Reason: Patient Screened Positive For Nutrition Risk For Unintentional Weight Loss and Decreased Appetite.   Jenny Soto is a 61 year old female patient diagnosed renal cell cancer.Contacted patient via telephone for positive nutrition risk. She reported her appetite and intake are great. She is eating very well. Patient not at nutrition risk at this time. The patient is without any nutrition questions or concerns. I have encouraged the patient to contact RD for nutrition questions or concerns.   RD available for nutrition needs.   Jenny Finn, MS, RD, LDN [phone]

## 2012-11-11 ENCOUNTER — Telehealth: Payer: Self-pay | Admitting: Oncology

## 2012-11-11 ENCOUNTER — Ambulatory Visit (HOSPITAL_BASED_OUTPATIENT_CLINIC_OR_DEPARTMENT_OTHER): Payer: BC Managed Care – PPO | Admitting: Oncology

## 2012-11-11 ENCOUNTER — Other Ambulatory Visit (HOSPITAL_BASED_OUTPATIENT_CLINIC_OR_DEPARTMENT_OTHER): Payer: BC Managed Care – PPO | Admitting: Lab

## 2012-11-11 VITALS — BP 127/83 | HR 75 | Temp 98.6°F | Resp 20 | Ht 65.0 in | Wt 155.2 lb

## 2012-11-11 DIAGNOSIS — C649 Malignant neoplasm of unspecified kidney, except renal pelvis: Secondary | ICD-10-CM

## 2012-11-11 DIAGNOSIS — M545 Low back pain: Secondary | ICD-10-CM

## 2012-11-11 LAB — COMPREHENSIVE METABOLIC PANEL (CC13)
ALT: 26 U/L (ref 0–55)
Albumin: 3.7 g/dL (ref 3.5–5.0)
CO2: 26 mEq/L (ref 22–29)
Calcium: 10.1 mg/dL (ref 8.4–10.4)
Chloride: 105 mEq/L (ref 98–107)
Glucose: 107 mg/dl — ABNORMAL HIGH (ref 70–99)
Potassium: 4.6 mEq/L (ref 3.5–5.1)
Sodium: 140 mEq/L (ref 136–145)
Total Bilirubin: 0.6 mg/dL (ref 0.20–1.20)
Total Protein: 7.6 g/dL (ref 6.4–8.3)

## 2012-11-11 LAB — CBC WITH DIFFERENTIAL/PLATELET
BASO%: 0.6 % (ref 0.0–2.0)
Eosinophils Absolute: 1.6 10*3/uL — ABNORMAL HIGH (ref 0.0–0.5)
MCHC: 33.8 g/dL (ref 31.5–36.0)
MONO#: 0.7 10*3/uL (ref 0.1–0.9)
NEUT#: 2.2 10*3/uL (ref 1.5–6.5)
RBC: 4.35 10*6/uL (ref 3.70–5.45)
RDW: 13.7 % (ref 11.2–14.5)
WBC: 7.6 10*3/uL (ref 3.9–10.3)
lymph#: 3.1 10*3/uL (ref 0.9–3.3)

## 2012-11-11 NOTE — Progress Notes (Signed)
Hematology and Oncology Follow Up Visit  [facility] [id number] 1951/02/04 62 y.o. 11/11/2012 10:49 AM  Jenny Soto, M.D.  Jenny Soto, M.D.  Jenny Fuller, MD    Principle Diagnosis: A 63 year old female with advanced renal cell carcinoma initially presented in 2006.  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  Prior Therapy:  1. Status post nephrectomy in 2006.  However, she developed recurrent disease in 2009.  2. The patient underwent surgical resection, VATS procedure and wedge resection of the right lower lobe done in 2009.  Current therapy: Active surveillance.  Interim History: [name] presents today for a follow-up visit.  Since her last visit, she reports left sided lower back pain. She reports her pain radiates to the back of her left leg but not past the knee. No weakness or loss of bladder or bowel function. Pain all the time as nagging  Graded 5-6/10. She did not report any fevers.  She has not reported any chills.  Performance status and activity level remain reasonable. She is not reporting pulmonary symptoms at this time. No urinary symptoms on today's visit either.   Medications: I have reviewed the patient's current medications. No current outpatient prescriptions on file.  Allergies: No Known Allergies  Past Medical History, Surgical history, Social history, and Family History were reviewed and updated.  Review of Systems: Constitutional:  Negative for fever, chills, night sweats, anorexia, weight loss, pain. Cardiovascular: no chest pain or dyspnea on exertion Respiratory: negative Neurological: negative Dermatological: negative ENT: negative Skin: Negative. Gastrointestinal: no abdominal pain, change in bowel habits, or black or bloody stools Genito-Urinary: no dysuria, trouble voiding, or hematuria Hematological and Lymphatic: negative Breast: negative Musculoskeletal: negative Remaining ROS negative. Physical Exam: Blood  pressure 127/83, pulse 75, temperature 98.6 F (37 C), temperature source Oral, resp. rate 20, height 5\' 5"  (1.651 m), weight 155 lb 3.2 oz (70.398 kg). ECOG: 0 General appearance: alert Head: Normocephalic, without obvious abnormality, atraumatic Neck: no adenopathy, no carotid bruit, no JVD, supple, symmetrical, trachea midline and thyroid not enlarged, symmetric, no tenderness/mass/nodules Lymph nodes: Cervical, supraclavicular, and axillary nodes normal. Heart:regular rate and rhythm, S1, S2 normal, no murmur, click, rub or gallop Lung:chest clear, no wheezing, rales, normal symmetric air entry Abdomin: soft, non-tender, without masses or organomegaly EXT:no erythema, induration, or nodules   Lab Results: Lab Results  Component Value Date   WBC 7.6 11/11/2012   HGB 14.3 11/11/2012   HCT 42.4 11/11/2012   MCV 97.5 11/11/2012   PLT 286 11/11/2012     Chemistry      Component Value Date/Time   NA 140 11/11/2012 0956   NA 140 11/07/2011 1522   NA 144 05/09/2011 0929   K 4.6 11/11/2012 0956   K 4.2 11/07/2011 1522   K 5.3* 05/09/2011 0929   CL 105 11/11/2012 0956   CL 104 11/07/2011 1522   CL 105 05/09/2011 0929   CO2 26 11/11/2012 0956   CO2 28 11/07/2011 1522   CO2 25 05/09/2011 0929   BUN 15.2 11/11/2012 0956   BUN 16 11/07/2011 1522   BUN 14 05/09/2011 0929   CREATININE 1.1 11/11/2012 0956   CREATININE 1.11* 11/07/2011 1522   CREATININE 1.0 05/09/2011 0929      Component Value Date/Time   CALCIUM 10.1 11/11/2012 0956   CALCIUM 10.0 11/07/2011 1522   CALCIUM 9.3 05/09/2011 0929   ALKPHOS 73 11/11/2012 0956   ALKPHOS 62 11/07/2011 1522   ALKPHOS  55 05/09/2011 0929   AST 24 11/11/2012 0956   AST 20 11/07/2011 1522   AST 30 05/09/2011 0929   ALT 26 11/11/2012 0956   ALT 16 11/07/2011 1522   BILITOT 0.60 11/11/2012 0956   BILITOT 0.3 11/07/2011 1522   BILITOT 0.60 05/09/2011 0929      Impression and Plan:  A 62 year old female with the following issues. 1. Renal cell carcinoma initially  diagnosed at a T3 disease status post nephrectomy in 2006.  She had a pulmonary metastasis resected in 2009.  She has continued to have stage IV NAD disease with her last CT scan in 04/2012.  At this time unless she develops systemic disease, I will keep her off systemic therapy.  I think she will be an excellent candidate for systemic therapy if and when she develops measurable disease. I will repeat her CT scan in 6 months with follow up visit after 6 months.  2. Left sided lower back pain: Likely due spinal stenosis, herniated disk and less likely cancer bone mets. However, I will obtain MRI to rule that out and make make the appropriate referral at that time. If indeed she has cancer spread to the bone, will evaluate her sooner than 6 months.   [facility], MD 3/19/201410:49 AM

## 2012-11-18 ENCOUNTER — Other Ambulatory Visit: Payer: Self-pay | Admitting: Oncology

## 2012-11-18 ENCOUNTER — Ambulatory Visit (HOSPITAL_COMMUNITY)
Admission: RE | Admit: 2012-11-18 | Discharge: 2012-11-18 | Disposition: A | Discharge status: Home / Self Care | Payer: BC Managed Care – PPO | Source: Ambulatory Visit | Attending: Oncology | Admitting: Oncology

## 2012-11-18 DIAGNOSIS — C649 Malignant neoplasm of unspecified kidney, except renal pelvis: Secondary | ICD-10-CM

## 2012-11-18 DIAGNOSIS — M48061 Spinal stenosis, lumbar region without neurogenic claudication: Secondary | ICD-10-CM | POA: Insufficient documentation

## 2012-11-18 DIAGNOSIS — M549 Dorsalgia, unspecified: Secondary | ICD-10-CM | POA: Insufficient documentation

## 2012-11-18 DIAGNOSIS — M79609 Pain in unspecified limb: Secondary | ICD-10-CM | POA: Insufficient documentation

## 2012-11-18 DIAGNOSIS — M47817 Spondylosis without myelopathy or radiculopathy, lumbosacral region: Secondary | ICD-10-CM | POA: Insufficient documentation

## 2012-11-18 DIAGNOSIS — IMO0002 Reserved for concepts with insufficient information to code with codable children: Secondary | ICD-10-CM | POA: Insufficient documentation

## 2012-11-18 DIAGNOSIS — Z905 Acquired absence of kidney: Secondary | ICD-10-CM | POA: Insufficient documentation

## 2012-11-18 MED ORDER — GADOBENATE DIMEGLUMINE 529 MG/ML IV SOLN
14.0000 mL | Freq: Once | INTRAVENOUS | Status: AC | PRN
  Administered 2012-11-18: 14 mL via INTRAVENOUS

## 2013-05-12 ENCOUNTER — Other Ambulatory Visit: Payer: BC Managed Care – PPO | Admitting: Lab

## 2013-05-12 ENCOUNTER — Other Ambulatory Visit: Payer: Self-pay | Admitting: Oncology

## 2013-05-12 ENCOUNTER — Ambulatory Visit (HOSPITAL_COMMUNITY)
Admission: RE | Admit: 2013-05-12 | Discharge: 2013-05-12 | Disposition: A | Discharge status: Home / Self Care | Payer: BC Managed Care – PPO | Source: Ambulatory Visit | Attending: Oncology | Admitting: Oncology

## 2013-05-12 ENCOUNTER — Other Ambulatory Visit (HOSPITAL_BASED_OUTPATIENT_CLINIC_OR_DEPARTMENT_OTHER): Payer: BC Managed Care – PPO | Admitting: Lab

## 2013-05-12 ENCOUNTER — Encounter (HOSPITAL_COMMUNITY): Payer: Self-pay

## 2013-05-12 DIAGNOSIS — N289 Disorder of kidney and ureter, unspecified: Secondary | ICD-10-CM | POA: Insufficient documentation

## 2013-05-12 DIAGNOSIS — I700 Atherosclerosis of aorta: Secondary | ICD-10-CM | POA: Insufficient documentation

## 2013-05-12 DIAGNOSIS — Z9089 Acquired absence of other organs: Secondary | ICD-10-CM | POA: Insufficient documentation

## 2013-05-12 DIAGNOSIS — C649 Malignant neoplasm of unspecified kidney, except renal pelvis: Secondary | ICD-10-CM

## 2013-05-12 DIAGNOSIS — N281 Cyst of kidney, acquired: Secondary | ICD-10-CM | POA: Insufficient documentation

## 2013-05-12 DIAGNOSIS — Z905 Acquired absence of kidney: Secondary | ICD-10-CM | POA: Insufficient documentation

## 2013-05-12 DIAGNOSIS — C7800 Secondary malignant neoplasm of unspecified lung: Secondary | ICD-10-CM | POA: Insufficient documentation

## 2013-05-12 LAB — COMPREHENSIVE METABOLIC PANEL (CC13)
AST: 21 U/L (ref 5–34)
Albumin: 3.8 g/dL (ref 3.5–5.0)
BUN: 18.5 mg/dL (ref 7.0–26.0)
Calcium: 10.2 mg/dL (ref 8.4–10.4)
Chloride: 106 mEq/L (ref 98–109)
Creatinine: 1.1 mg/dL (ref 0.6–1.1)
Glucose: 97 mg/dl (ref 70–140)

## 2013-05-12 LAB — CBC WITH DIFFERENTIAL/PLATELET
Basophils Absolute: 0.1 10*3/uL (ref 0.0–0.1)
EOS%: 5.6 % (ref 0.0–7.0)
Eosinophils Absolute: 0.4 10*3/uL (ref 0.0–0.5)
MCH: 33 pg (ref 25.1–34.0)
MCV: 98.6 fL (ref 79.5–101.0)
NEUT%: 41.4 % (ref 38.4–76.8)
WBC: 6.6 10*3/uL (ref 3.9–10.3)
lymph#: 2.8 10*3/uL (ref 0.9–3.3)

## 2013-05-12 MED ORDER — IOHEXOL 300 MG/ML  SOLN
100.0000 mL | Freq: Once | INTRAMUSCULAR | Status: AC | PRN
  Administered 2013-05-12: 100 mL via INTRAVENOUS

## 2013-05-14 ENCOUNTER — Ambulatory Visit (HOSPITAL_BASED_OUTPATIENT_CLINIC_OR_DEPARTMENT_OTHER): Payer: BC Managed Care – PPO | Admitting: Oncology

## 2013-05-14 ENCOUNTER — Telehealth: Payer: Self-pay | Admitting: *Deleted

## 2013-05-14 VITALS — BP 135/88 | Ht 65.0 in | Wt 155.3 lb

## 2013-05-14 DIAGNOSIS — C649 Malignant neoplasm of unspecified kidney, except renal pelvis: Secondary | ICD-10-CM

## 2013-05-14 NOTE — Telephone Encounter (Signed)
appts made and printed...td 

## 2013-05-14 NOTE — Progress Notes (Signed)
Hematology and Oncology Follow Up Visit  Jenny Soto [id number] [birth year] 62 y.o. 05/14/2013 9:48 AM  Ines Bloomer, M.D.  [name], M.D.  [name], MD    Principle Diagnosis: A 62 year old female with advanced renal cell carcinoma initially presented in 2006.  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  Prior Therapy:  1. Status post nephrectomy in 2006.  However, she developed recurrent disease in 2009.  2. The patient underwent surgical resection, VATS procedure and wedge resection of the right lower lobe done in 2009.  Current therapy: Active surveillance.  Interim History: Jenny Soto presents today for a follow-up visit. Since her last visit she has been doing well. Her back pain is better. No weakness or loss of bladder or bowel function. Pain nagging  At times Graded 1-3/10. She did not report any fevers.  She has not reported any chills.  Performance status and activity level remain reasonable. She is not reporting pulmonary symptoms at this time. No urinary symptoms on today's visit either.   Medications: I have reviewed the patient's current medications. No current outpatient prescriptions on file.  Allergies: No Known Allergies  Past Medical History, Surgical history, Social history, and Family History were reviewed and updated.  Review of Systems: Constitutional:  Negative for fever, chills, night sweats, anorexia, weight loss, pain. Cardiovascular: no chest pain or dyspnea on exertion Respiratory: negative Neurological: negative Dermatological: negative ENT: negative Skin: Negative. Gastrointestinal: no abdominal pain, change in bowel habits, or black or bloody stools Genito-Urinary: no dysuria, trouble voiding, or hematuria Hematological and Lymphatic: negative Breast: negative Musculoskeletal: negative Remaining ROS negative. Physical Exam: Blood pressure 135/88, height 5\' 5"  (1.651 m), weight 155 lb 4.8 oz (70.444 kg). ECOG:  0 General appearance: alert Head: Normocephalic, without obvious abnormality, atraumatic Neck: no adenopathy, no carotid bruit, no JVD, supple, symmetrical, trachea midline and thyroid not enlarged, symmetric, no tenderness/mass/nodules Lymph nodes: Cervical, supraclavicular, and axillary nodes normal. Heart:regular rate and rhythm, S1, S2 normal, no murmur, click, rub or gallop Lung:chest clear, no wheezing, rales, normal symmetric air entry Abdomin: soft, non-tender, without masses or organomegaly EXT:no erythema, induration, or nodules   Lab Results: Lab Results  Component Value Date   WBC 6.6 05/12/2013   HGB 14.0 05/12/2013   HCT 41.9 05/12/2013   MCV 98.6 05/12/2013   PLT 288 05/12/2013     Chemistry      Component Value Date/Time   NA 140 05/12/2013 0942   NA 140 11/07/2011 1522   NA 144 05/09/2011 0929   K 5.0 05/12/2013 0942   K 4.2 11/07/2011 1522   K 5.3* 05/09/2011 0929   CL 105 11/11/2012 0956   CL 104 11/07/2011 1522   CL 105 05/09/2011 0929   CO2 26 05/12/2013 0942   CO2 28 11/07/2011 1522   CO2 25 05/09/2011 0929   BUN 18.5 05/12/2013 0942   BUN 16 11/07/2011 1522   BUN 14 05/09/2011 0929   CREATININE 1.1 05/12/2013 0942   CREATININE 1.11* 11/07/2011 1522   CREATININE 1.0 05/09/2011 0929      Component Value Date/Time   CALCIUM 10.2 05/12/2013 0942   CALCIUM 10.0 11/07/2011 1522   CALCIUM 9.3 05/09/2011 0929   ALKPHOS 64 05/12/2013 0942   ALKPHOS 62 11/07/2011 1522   ALKPHOS 55 05/09/2011 0929   AST 21 05/12/2013 0942   AST 20 11/07/2011 1522   AST 30 05/09/2011 0929   ALT 23 05/12/2013 0942   ALT  16 11/07/2011 1522   ALT 26 05/09/2011 0929   BILITOT 0.41 05/12/2013 0942   BILITOT 0.3 11/07/2011 1522   BILITOT 0.60 05/09/2011 0929     CT ABDOMEN AND PELVIS WITH AND WITHOUT CONTRAST  TECHNIQUE:  Multidetector CT imaging of the chest was performed during  intravenous contrast administration. Multidetector CT imaging of the  abdomen and pelvis was performed following the  standard protocol  before and during bolus administration of intravenous contrast.  CONTRAST: OMNIPAQUE IOHEXOL 300 MG/ML SOLN  COMPARISON: None.  FINDINGS:  CT CHEST FINDINGS  Postsurgical changes related to prior right lower lobe wedge  resection. No suspicious pulmonary nodules. Mild dependent  atelectasis at the lung bases. No pleural effusion or pneumothorax.  Visualized thyroid is unremarkable.  The heart is normal in size. No pericardial effusion.  No suspicious mediastinal, hilar, or axillary lymphadenopathy.  Very mild degenerative changes of the thoracic spine.  CT ABDOMEN AND PELVIS FINDINGS  Liver, pancreas, and adrenal glands are within normal limits.  Prior splenectomy. Residual splenic tissue/ stenosis in the left  upper abdomen measuring 1.7 x 1.7 cm (series 6/image 84).  Status post cholecystectomy. No intrahepatic or extrahepatic ductal  dilatation.  Status post left nephrectomy. No abnormal soft tissue in the  surgical bed.  8 mm medial right upper pole renal cyst (series 6/image 89). 5 x 5 x  11 mm complex cystic lesion in the anterior right lower kidney with  suspected enhancing septation (series 604/ image 65).  No evidence of bowel obstruction. Normal appendix.  Atherosclerotic calcifications of the abdominal aorta and branch  vessels.  No abdominopelvic ascites.  No suspicious abdominopelvic lymphadenopathy.  Stable partially calcified appearance of the uterine fundus (series  6/image 137), possibly reflecting uterine fibroids or dystrophic  calcifications. Bilateral ovaries are within normal limits.  Bladder is within normal limits.  Mild degenerative changes of the lumbar spine.  IMPRESSION:  CT CHEST IMPRESSION  Status post right lower lobe wedge resection.  No evidence of metastatic disease in the chest.  CT ABDOMEN AND PELVIS IMPRESSION  Status post left nephrectomy and splenectomy.  No evidence of recurrent or metastatic disease in the   abdomen/pelvis.  11 mm complex cystic lesion with suspected enhancing septation in  the anterior right lower kidney, Bosniak III. Given the small size,  follow-up CT or MRI abdomen with/without contrast is suggested in 6  months.   MRI LUMBAR SPINE WITHOUT AND WITH CONTRAST  Technique: Multiplanar and multiecho pulse sequences of the lumbar  spine were obtained without and with intravenous contrast.  Contrast: 14mL MULTIHANCE GADOBENATE DIMEGLUMINE 529 MG/ML IV SOLN  Comparison: CT abdomen and 05/08/2012  Findings: Lumbar alignment is normal. Negative for fracture or  mass. Hemangioma L3 vertebral body. Conus medullaris is normal.  Left nephrectomy. No retroperitoneal mass or adenopathy is  detected.  Postcontrast imaging reveals no enhancing mass lesion.  T12-L1: Negative  L1-2: Negative  L2-3: Mild disc degeneration without disc protrusion or stenosis  L3-4: Mild disc degeneration and mild facet degeneration without  spinal stenosis.  L4-5: Mild disc bulging and mild facet and ligamentum flavum  hypertrophy. Mild spinal stenosis. Negative for disc protrusion.  L5-S1: Mild disc and facet degeneration without neural impingement  IMPRESSION:  Negative for metastatic disease or fracture.  Mild lumbar degenerative changes. Mild spinal stenosis L4-5.  Negative for neural impingement.  Impression and Plan:  A 62 year old female with the following issues. 1. Renal cell carcinoma initially diagnosed at a T3 disease status  post nephrectomy in 2006.  She had a pulmonary metastasis resected in 2009.  She has continued to have stage IV NAD disease with her last CT scan in 04/2013 which was discussed today.  At this time unless she develops systemic disease, I will keep her off systemic therapy.  I think she will be an excellent candidate for systemic therapy if and when she develops measurable disease. I will repeat her CT scan in 12 months with follow up visit after 6 months.  2. Left sided  lower back pain: Likely due spinal stenosis, herniated disk. MRI was done in 10/2012 and was reviewed again today. She is doing better and following up with a spine specialist.    Eli Hose, MD 9/19/20149:48 AM

## 2013-06-28 ENCOUNTER — Other Ambulatory Visit (HOSPITAL_COMMUNITY): Payer: Self-pay | Admitting: Obstetrics and Gynecology

## 2013-06-28 DIAGNOSIS — Z1231 Encounter for screening mammogram for malignant neoplasm of breast: Secondary | ICD-10-CM

## 2013-07-01 ENCOUNTER — Other Ambulatory Visit: Payer: Self-pay

## 2013-07-14 ENCOUNTER — Ambulatory Visit (HOSPITAL_COMMUNITY)
Admission: RE | Admit: 2013-07-14 | Discharge: 2013-07-14 | Disposition: A | Discharge status: Home / Self Care | Payer: BC Managed Care – PPO | Source: Ambulatory Visit | Attending: Obstetrics and Gynecology | Admitting: Obstetrics and Gynecology

## 2013-07-14 DIAGNOSIS — Z1231 Encounter for screening mammogram for malignant neoplasm of breast: Secondary | ICD-10-CM

## 2013-07-15 ENCOUNTER — Other Ambulatory Visit: Payer: Self-pay | Admitting: Obstetrics and Gynecology

## 2013-07-15 DIAGNOSIS — R928 Other abnormal and inconclusive findings on diagnostic imaging of breast: Secondary | ICD-10-CM

## 2013-07-26 ENCOUNTER — Ambulatory Visit
Admission: RE | Admit: 2013-07-26 | Discharge: 2013-07-26 | Disposition: A | Discharge status: Home / Self Care | Payer: BC Managed Care – PPO | Source: Ambulatory Visit | Attending: Obstetrics and Gynecology | Admitting: Obstetrics and Gynecology

## 2013-07-26 DIAGNOSIS — R928 Other abnormal and inconclusive findings on diagnostic imaging of breast: Secondary | ICD-10-CM

## 2013-07-30 ENCOUNTER — Encounter: Payer: Self-pay | Admitting: Oncology

## 2013-08-03 ENCOUNTER — Encounter: Payer: Self-pay | Admitting: *Deleted

## 2013-09-28 ENCOUNTER — Telehealth: Payer: Self-pay | Admitting: Oncology

## 2013-09-28 NOTE — Telephone Encounter (Signed)
, °

## 2013-11-09 ENCOUNTER — Telehealth: Payer: Self-pay | Admitting: Oncology

## 2013-11-09 ENCOUNTER — Encounter: Payer: Self-pay | Admitting: Oncology

## 2013-11-09 ENCOUNTER — Other Ambulatory Visit (HOSPITAL_BASED_OUTPATIENT_CLINIC_OR_DEPARTMENT_OTHER): Payer: BC Managed Care – PPO

## 2013-11-09 ENCOUNTER — Ambulatory Visit (HOSPITAL_BASED_OUTPATIENT_CLINIC_OR_DEPARTMENT_OTHER): Payer: BC Managed Care – PPO | Admitting: Oncology

## 2013-11-09 VITALS — BP 127/71 | HR 74 | Temp 98.2°F | Resp 18 | Ht 65.0 in | Wt 147.3 lb

## 2013-11-09 DIAGNOSIS — C649 Malignant neoplasm of unspecified kidney, except renal pelvis: Secondary | ICD-10-CM

## 2013-11-09 DIAGNOSIS — M545 Low back pain, unspecified: Secondary | ICD-10-CM

## 2013-11-09 LAB — COMPREHENSIVE METABOLIC PANEL (CC13)
ALK PHOS: 63 U/L (ref 40–150)
ALT: 17 U/L (ref 0–55)
AST: 18 U/L (ref 5–34)
Albumin: 4 g/dL (ref 3.5–5.0)
Anion Gap: 11 mEq/L (ref 3–11)
BILIRUBIN TOTAL: 0.6 mg/dL (ref 0.20–1.20)
BUN: 17.7 mg/dL (ref 7.0–26.0)
CO2: 23 mEq/L (ref 22–29)
Calcium: 10.2 mg/dL (ref 8.4–10.4)
Chloride: 105 mEq/L (ref 98–109)
Creatinine: 1 mg/dL (ref 0.6–1.1)
Glucose: 109 mg/dl (ref 70–140)
Potassium: 4.4 mEq/L (ref 3.5–5.1)
SODIUM: 139 meq/L (ref 136–145)
TOTAL PROTEIN: 7.4 g/dL (ref 6.4–8.3)

## 2013-11-09 LAB — CBC WITH DIFFERENTIAL/PLATELET
BASO%: 1.5 % (ref 0.0–2.0)
Basophils Absolute: 0.1 10*3/uL (ref 0.0–0.1)
EOS%: 3.8 % (ref 0.0–7.0)
Eosinophils Absolute: 0.3 10*3/uL (ref 0.0–0.5)
HCT: 40.3 % (ref 34.8–46.6)
HGB: 13.3 g/dL (ref 11.6–15.9)
LYMPH%: 42.9 % (ref 14.0–49.7)
MCH: 32.4 pg (ref 25.1–34.0)
MCHC: 33 g/dL (ref 31.5–36.0)
MCV: 98.4 fL (ref 79.5–101.0)
MONO#: 0.7 10*3/uL (ref 0.1–0.9)
MONO%: 9 % (ref 0.0–14.0)
NEUT#: 3.2 10*3/uL (ref 1.5–6.5)
NEUT%: 42.8 % (ref 38.4–76.8)
PLATELETS: 268 10*3/uL (ref 145–400)
RBC: 4.09 10*6/uL (ref 3.70–5.45)
RDW: 13.9 % (ref 11.2–14.5)
WBC: 7.5 10*3/uL (ref 3.9–10.3)
lymph#: 3.2 10*3/uL (ref 0.9–3.3)

## 2013-11-09 NOTE — Telephone Encounter (Signed)
gv pt appt schedule for sept. central will call pt w/ct appt.

## 2013-11-09 NOTE — Progress Notes (Signed)
Hematology and Oncology Follow Up Visit  Jenny Soto [id number] 1951/07/04 63 y.o. 11/09/2013 2:52 PM  Jenny Soto, M.D.  [place] Jenny Soto, M.D.  Jenny Amass, MD    Principle Diagnosis: A 63 year old female with advanced renal cell carcinoma initially presented in 2006.  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  Prior Therapy:  1. Status post nephrectomy in 2006.  However, she developed recurrent disease in 2009.  2. The patient underwent surgical resection, VATS procedure and wedge resection of the right lower lobe done in 2009.  Current therapy: Active surveillance.  Interim History: Jenny Soto presents today for a follow-up visit. Since her last visit she has been doing well. Her back pain is better. No weakness or loss of bladder or bowel function.  She has not reported any chills.  Performance status and activity level remain reasonable. She is not reporting pulmonary symptoms at this time. No urinary symptoms on today's visit either. She continues to perform activities of daily living without any hindrance or decline. Has not reported any recent hospitalization or illnesses.  Medications: I have reviewed the patient's current medications. No current outpatient prescriptions on file.  Allergies: No Known Allergies  Past Medical History, Surgical history, Social history, and Family History were reviewed and updated.  Review of Systems: Constitutional:  Negative for fever, chills, night sweats, anorexia, weight loss, pain. Cardiovascular: no chest pain or dyspnea on exertion Respiratory: negative Neurological: negative Dermatological: negative ENT: negative Skin: Negative. Gastrointestinal: no abdominal pain, change in bowel habits, or black or bloody stools Genito-Urinary: no dysuria, trouble voiding, or hematuria Hematological and Lymphatic: negative Breast: negative Musculoskeletal: negative Remaining ROS negative. Physical Exam: Blood pressure  127/71, pulse 74, temperature 98.2 F (36.8 C), temperature source Oral, resp. rate 18, height 5\' 5"  (1.651 m), weight 147 lb 4.8 oz (66.815 kg), SpO2 100.00%. ECOG: 0 General appearance: alert Head: Normocephalic, without obvious abnormality, atraumatic Neck: no adenopathy, no carotid bruit, no JVD, supple, symmetrical, trachea midline and thyroid not enlarged, symmetric, no tenderness/mass/nodules Lymph nodes: Cervical, supraclavicular, and axillary nodes normal. Heart:regular rate and rhythm, S1, S2 normal, no murmur, click, rub or gallop Lung:chest clear, no wheezing, rales, normal symmetric air entry Abdomin: soft, non-tender, without masses or organomegaly EXT:no erythema, induration, or nodules   Lab Results: Lab Results  Component Value Date   WBC 7.5 11/09/2013   HGB 13.3 11/09/2013   HCT 40.3 11/09/2013   MCV 98.4 11/09/2013   PLT 268 11/09/2013     Chemistry      Component Value Date/Time   NA 140 05/12/2013 0942   NA 140 11/07/2011 1522   NA 144 05/09/2011 0929   K 5.0 05/12/2013 0942   K 4.2 11/07/2011 1522   K 5.3* 05/09/2011 0929   CL 105 11/11/2012 0956   CL 104 11/07/2011 1522   CL 105 05/09/2011 0929   CO2 26 05/12/2013 0942   CO2 28 11/07/2011 1522   CO2 25 05/09/2011 0929   BUN 18.5 05/12/2013 0942   BUN 16 11/07/2011 1522   BUN 14 05/09/2011 0929   CREATININE 1.1 05/12/2013 0942   CREATININE 1.11* 11/07/2011 1522   CREATININE 1.0 05/09/2011 0929      Component Value Date/Time   CALCIUM 10.2 05/12/2013 0942   CALCIUM 10.0 11/07/2011 1522   CALCIUM 9.3 05/09/2011 0929   ALKPHOS 64 05/12/2013 0942   ALKPHOS 62 11/07/2011 1522   ALKPHOS 55 05/09/2011 0929   AST 21 05/12/2013  0942   AST 20 11/07/2011 1522   AST 30 05/09/2011 0929   ALT 23 05/12/2013 0942   ALT 16 11/07/2011 1522   ALT 26 05/09/2011 0929   BILITOT 0.41 05/12/2013 0942   BILITOT 0.3 11/07/2011 1522   BILITOT 0.60 05/09/2011 0929      Impression and Plan:  A 63 year old female with the following  issues. 1. Renal cell carcinoma initially diagnosed at a T3 disease status post nephrectomy in 2006.  She had a pulmonary metastasis resected in 2009.  She has continued to have stage IV NAD disease with her last CT scan in 04/2013. At this time unless she develops systemic disease, I will keep her off systemic therapy.  I think she will be an excellent candidate for systemic therapy if and when she develops measurable disease. I will repeat her CT scan in 6 months with follow up visit after 6 months.  2. Left sided lower back pain: Likely due spinal stenosis, herniated disk. MRI was done in 10/2012. She is doing better and following up with a spine specialist.    Jenny Button, MD 3/17/20152:52 PM

## 2013-11-11 ENCOUNTER — Other Ambulatory Visit: Payer: BC Managed Care – PPO

## 2013-11-11 ENCOUNTER — Ambulatory Visit: Payer: BC Managed Care – PPO | Admitting: Oncology

## 2014-01-20 ENCOUNTER — Encounter: Payer: Self-pay | Admitting: Internal Medicine

## 2014-05-10 ENCOUNTER — Other Ambulatory Visit: Payer: Self-pay | Admitting: Oncology

## 2014-05-10 ENCOUNTER — Ambulatory Visit (HOSPITAL_COMMUNITY)
Admission: RE | Admit: 2014-05-10 | Discharge: 2014-05-10 | Disposition: A | Discharge status: Home / Self Care | Payer: BC Managed Care – PPO | Source: Ambulatory Visit | Attending: Oncology | Admitting: Oncology

## 2014-05-10 ENCOUNTER — Other Ambulatory Visit (HOSPITAL_BASED_OUTPATIENT_CLINIC_OR_DEPARTMENT_OTHER): Payer: BC Managed Care – PPO

## 2014-05-10 DIAGNOSIS — C649 Malignant neoplasm of unspecified kidney, except renal pelvis: Secondary | ICD-10-CM | POA: Diagnosis present

## 2014-05-10 LAB — CBC WITH DIFFERENTIAL/PLATELET
BASO%: 1.5 % (ref 0.0–2.0)
Basophils Absolute: 0.1 10*3/uL (ref 0.0–0.1)
EOS%: 5.3 % (ref 0.0–7.0)
Eosinophils Absolute: 0.3 10*3/uL (ref 0.0–0.5)
HEMATOCRIT: 43.4 % (ref 34.8–46.6)
HGB: 14.1 g/dL (ref 11.6–15.9)
LYMPH%: 41.7 % (ref 14.0–49.7)
MCH: 32.7 pg (ref 25.1–34.0)
MCHC: 32.4 g/dL (ref 31.5–36.0)
MCV: 100.9 fL (ref 79.5–101.0)
MONO#: 0.7 10*3/uL (ref 0.1–0.9)
MONO%: 11.1 % (ref 0.0–14.0)
NEUT#: 2.5 10*3/uL (ref 1.5–6.5)
NEUT%: 40.4 % (ref 38.4–76.8)
PLATELETS: 291 10*3/uL (ref 145–400)
RBC: 4.3 10*6/uL (ref 3.70–5.45)
RDW: 13.5 % (ref 11.2–14.5)
WBC: 6.1 10*3/uL (ref 3.9–10.3)
lymph#: 2.5 10*3/uL (ref 0.9–3.3)

## 2014-05-10 LAB — COMPREHENSIVE METABOLIC PANEL (CC13)
ALT: 20 U/L (ref 0–55)
ANION GAP: 9 meq/L (ref 3–11)
AST: 24 U/L (ref 5–34)
Albumin: 3.9 g/dL (ref 3.5–5.0)
Alkaline Phosphatase: 60 U/L (ref 40–150)
BILIRUBIN TOTAL: 0.47 mg/dL (ref 0.20–1.20)
BUN: 17.1 mg/dL (ref 7.0–26.0)
CO2: 23 meq/L (ref 22–29)
CREATININE: 1 mg/dL (ref 0.6–1.1)
Calcium: 9.8 mg/dL (ref 8.4–10.4)
Chloride: 108 mEq/L (ref 98–109)
Glucose: 108 mg/dl (ref 70–140)
Potassium: 4.8 mEq/L (ref 3.5–5.1)
Sodium: 139 mEq/L (ref 136–145)
Total Protein: 7.4 g/dL (ref 6.4–8.3)

## 2014-05-10 MED ORDER — IOHEXOL 300 MG/ML  SOLN
100.0000 mL | Freq: Once | INTRAMUSCULAR | Status: AC | PRN
  Administered 2014-05-10: 100 mL via INTRAVENOUS

## 2014-05-12 ENCOUNTER — Telehealth: Payer: Self-pay | Admitting: Oncology

## 2014-05-12 ENCOUNTER — Ambulatory Visit (HOSPITAL_BASED_OUTPATIENT_CLINIC_OR_DEPARTMENT_OTHER): Payer: BC Managed Care – PPO | Admitting: Oncology

## 2014-05-12 ENCOUNTER — Encounter: Payer: Self-pay | Admitting: Oncology

## 2014-05-12 VITALS — BP 138/71 | HR 70 | Temp 97.7°F | Resp 18 | Ht 65.0 in | Wt 143.4 lb

## 2014-05-12 DIAGNOSIS — C649 Malignant neoplasm of unspecified kidney, except renal pelvis: Secondary | ICD-10-CM

## 2014-05-12 DIAGNOSIS — M545 Low back pain, unspecified: Secondary | ICD-10-CM

## 2014-05-12 DIAGNOSIS — C7800 Secondary malignant neoplasm of unspecified lung: Secondary | ICD-10-CM

## 2014-05-12 NOTE — Progress Notes (Signed)
Hematology and Oncology Follow Up Visit  Jenny Soto [id number] [birth year] 63 y.o. 05/12/2014 3:12 PM  [name], M.D.  [place] Rosana Hoes, M.D.  [name], MD    Principle Diagnosis: A 63 year old female with advanced renal cell carcinoma initially presented in 2006.  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  Prior Therapy:  1. Status post nephrectomy in 2006.  However, she developed recurrent disease in 2009.  2. The patient underwent surgical resection, VATS procedure and wedge resection of the right lower lobe done in 2009.  Current therapy: Active surveillance.  Interim History: Jenny Soto presents today for a follow-up visit with her husband. Since her last visit she has been doing well. Her back pain is better. [place] She reports no new complaints.  No weakness or loss of bladder or bowel function.  She has not reported any chills.  Performance status and activity level remain excellent.She is not reporting pulmonary symptoms at this time. No urinary symptoms on today's visit either. She continues to perform activities of daily living without any hindrance or decline. Has not reported any recent hospitalization or illnesses.She has not reported any headaches or blurry vision. Does not report any syncope or seizures. She reports no chest pain or leg edema. Does not report any lymphadenopathy or petechiae. The rest of symptoms review of systems unremarkable.  Medications: I have reviewed the patient's current medications. No current outpatient prescriptions on file.  Allergies: No Known Allergies  Past Medical History, Surgical history, Social history, and Family History were reviewed and updated.  Review of Systems: Constitutional:  Negative for fever, chills, night sweats, anorexia, weight loss, pain. Cardiovascular: no chest pain or dyspnea on exertion Respiratory: negative Neurological:  negative Dermatological: negative ENT: negative Skin: Negative. Gastrointestinal: no abdominal pain, change in bowel habits, or black or bloody stools Genito-Urinary: no dysuria, trouble voiding, or hematuria Hematological and Lymphatic: negative Breast: negative Musculoskeletal: negative Remaining ROS negative. Physical Exam: Blood pressure 138/71, pulse 70, temperature 97.7 F (36.5 C), temperature source Oral, resp. rate 18, height 5\' 5"  (1.651 m), weight 143 lb 6.4 oz (65.046 kg), SpO2 100.00%. ECOG: 0 General appearance: alert Head: Normocephalic, without obvious abnormality, atraumatic Neck: no adenopathy Lymph nodes: Cervical, supraclavicular, and axillary nodes normal. Heart:regular rate and rhythm, S1, S2 normal, no murmur, click, rub or gallop Lung:chest clear, no wheezing, rales, normal symmetric air entry Abdomin: soft, non-tender, without masses or organomegaly EXT:no erythema, induration, or nodules   Lab Results: Lab Results  Component Value Date   WBC 6.1 05/10/2014   HGB 14.1 05/10/2014   HCT 43.4 05/10/2014   MCV 100.9 05/10/2014   PLT 291 05/10/2014     Chemistry      Component Value Date/Time   NA 139 05/10/2014 0934   NA 140 11/07/2011 1522   NA 144 05/09/2011 0929   K 4.8 05/10/2014 0934   K 4.2 11/07/2011 1522   K 5.3* 05/09/2011 0929   CL 105 11/11/2012 0956   CL 104 11/07/2011 1522   CL 105 05/09/2011 0929   CO2 23 05/10/2014 0934   CO2 28 11/07/2011 1522   CO2 25 05/09/2011 0929   BUN 17.1 05/10/2014 0934   BUN 16 11/07/2011 1522   BUN 14 05/09/2011 0929   CREATININE 1.0 05/10/2014 0934   CREATININE 1.11* 11/07/2011 1522   CREATININE 1.0 05/09/2011 0929      Component  Value Date/Time   CALCIUM 9.8 05/10/2014 0934   CALCIUM 10.0 11/07/2011 1522   CALCIUM 9.3 05/09/2011 0929   ALKPHOS 60 05/10/2014 0934   ALKPHOS 62 11/07/2011 1522   ALKPHOS 55 05/09/2011 0929   AST 24 05/10/2014 0934   AST 20 11/07/2011 1522   AST 30 05/09/2011 0929   ALT 20 05/10/2014 0934    ALT 16 11/07/2011 1522   ALT 26 05/09/2011 0929   BILITOT 0.47 05/10/2014 0934   BILITOT 0.3 11/07/2011 1522   BILITOT 0.60 05/09/2011 0929     CLINICAL DATA: Left renal cell cancer status post left nephrectomy.  Pulmonary metastasis. Prior splenectomy and cholecystectomy.  EXAM:  CT CHEST, ABDOMEN, AND PELVIS WITH CONTRAST  TECHNIQUE:  Multidetector CT imaging of the chest, abdomen and pelvis was  performed following the standard protocol during bolus  administration of intravenous contrast.  CONTRAST: 132mL OMNIPAQUE IOHEXOL 300 MG/ML SOLN  COMPARISON: CT 05/12/2013  FINDINGS:  CT CHEST FINDINGS  Visualized thyroid is unremarkable. All no enlarged axillary,  mediastinal or hilar lymphadenopathy. Normal heart size. Trace  pericardial fluid within the superior pericardial recess. Normal  caliber aorta and main pulmonary artery. All central airways are  patent. Postsurgical changes within the right lower lobe compatible  with prior wedge resection. No discrete pulmonary nodules. No  pleural effusion or pneumothorax.  CT ABDOMEN AND PELVIS FINDINGS  Liver is normal in size and contour. Scattered tiny low-attenuation  lesions within the liver are stable from prior. Status post  cholecystectomy. Portal vein is patent. No intrahepatic or  extrahepatic biliary ductal dilatation.  Stable probable residual splenic tissue within the left upper  quadrant measuring 1.6 cm. Unchanged 7 mm calcified lesion within  the pancreatic tail, potentially representing calcified lesion  versus splenic artery aneurysm. Right adrenal gland is unremarkable.  Unchanged 8 mm right upper pole renal cyst (image 57; series 6).  Unchanged 1.1 cm complex cystic lesion within the lower pole of the  right kidney with suspected enhancing septation (image 39; series  604).  Status post splenectomy and left nephrectomy. No abnormal enhancing  tissue identified within the left nephrectomy bed.  Normal caliber abdominal  aorta. No retroperitoneal lymphadenopathy.  Scattered calcified atherosclerotic plaque involving the abdominal  aorta. Urinary bladder is unremarkable. Re- demonstrated  calcifications within the uterine body and fundus potentially  representing calcified fibroids or dystrophic calcifications.  No abnormal bowel wall thickening or evidence for bowel obstruction.  No free fluid or free intraperitoneal air.  No aggressive or acute appearing osseous lesions. Lower lumbar spine  degenerative change.  IMPRESSION:  Patient status post left nephrectomy and splenectomy without  evidence for locally recurrent or metastatic disease within the  chest, abdomen or pelvis.  Stable 11 mm complex cystic lesion within the inferior pole of the  right kidney. Attention on followup is recommended.    Impression and Plan:  A 63 year old female with the following issues. 1. Renal cell carcinoma initially diagnosed at a T3 disease status post nephrectomy in 2006.  She had a pulmonary metastasis resected in 2009.  She has continued to have stage IV NAD disease. A CT scan from September of 2015 was discussed and continue to show no evidence of disease.  At this time unless she develops systemic disease, I will keep her off systemic therapy.  I think she will be an excellent candidate for systemic therapy if and when she develops measurable disease. I will repeat her CT scan in 12 months with follow  up visit after 6 months.  2. Left sided lower back pain: Likely due spinal stenosis, herniated disk. MRI was done in 10/2012. She is doing better and following up with a spine specialist.    Zola Button, MD 9/17/20153:12 PM

## 2014-05-12 NOTE — Telephone Encounter (Signed)
gv and printed appt sched and avs forpt for March 2016 °

## 2014-10-06 ENCOUNTER — Other Ambulatory Visit: Payer: Self-pay | Admitting: Obstetrics and Gynecology

## 2014-10-06 DIAGNOSIS — Z1231 Encounter for screening mammogram for malignant neoplasm of breast: Secondary | ICD-10-CM

## 2014-11-10 ENCOUNTER — Telehealth: Payer: Self-pay | Admitting: Oncology

## 2014-11-10 ENCOUNTER — Other Ambulatory Visit (HOSPITAL_BASED_OUTPATIENT_CLINIC_OR_DEPARTMENT_OTHER): Payer: BC Managed Care – PPO

## 2014-11-10 ENCOUNTER — Ambulatory Visit (HOSPITAL_BASED_OUTPATIENT_CLINIC_OR_DEPARTMENT_OTHER): Payer: BC Managed Care – PPO | Admitting: Oncology

## 2014-11-10 VITALS — BP 139/91 | HR 78 | Temp 98.0°F | Resp 18 | Ht 65.0 in | Wt 149.8 lb

## 2014-11-10 DIAGNOSIS — Z85528 Personal history of other malignant neoplasm of kidney: Secondary | ICD-10-CM

## 2014-11-10 DIAGNOSIS — C649 Malignant neoplasm of unspecified kidney, except renal pelvis: Secondary | ICD-10-CM

## 2014-11-10 LAB — CBC WITH DIFFERENTIAL/PLATELET
BASO%: 1.1 % (ref 0.0–2.0)
BASOS ABS: 0.1 10*3/uL (ref 0.0–0.1)
EOS%: 4 % (ref 0.0–7.0)
Eosinophils Absolute: 0.2 10*3/uL (ref 0.0–0.5)
HCT: 40.5 % (ref 34.8–46.6)
HEMOGLOBIN: 13.3 g/dL (ref 11.6–15.9)
LYMPH%: 45.6 % (ref 14.0–49.7)
MCH: 32.8 pg (ref 25.1–34.0)
MCHC: 32.8 g/dL (ref 31.5–36.0)
MCV: 99.8 fL (ref 79.5–101.0)
MONO#: 0.6 10*3/uL (ref 0.1–0.9)
MONO%: 10.7 % (ref 0.0–14.0)
NEUT%: 38.6 % (ref 38.4–76.8)
NEUTROS ABS: 2.2 10*3/uL (ref 1.5–6.5)
Platelets: 297 10*3/uL (ref 145–400)
RBC: 4.06 10*6/uL (ref 3.70–5.45)
RDW: 13.9 % (ref 11.2–14.5)
WBC: 5.7 10*3/uL (ref 3.9–10.3)
lymph#: 2.6 10*3/uL (ref 0.9–3.3)

## 2014-11-10 LAB — COMPREHENSIVE METABOLIC PANEL (CC13)
ALBUMIN: 3.9 g/dL (ref 3.5–5.0)
ALT: 20 U/L (ref 0–55)
AST: 19 U/L (ref 5–34)
Alkaline Phosphatase: 56 U/L (ref 40–150)
Anion Gap: 8 mEq/L (ref 3–11)
BUN: 11 mg/dL (ref 7.0–26.0)
CALCIUM: 9.8 mg/dL (ref 8.4–10.4)
CHLORIDE: 107 meq/L (ref 98–109)
CO2: 25 mEq/L (ref 22–29)
Creatinine: 1 mg/dL (ref 0.6–1.1)
EGFR: 64 mL/min/{1.73_m2} — AB (ref >90)
Glucose: 94 mg/dl (ref 70–140)
Potassium: 4.6 mEq/L (ref 3.5–5.1)
Sodium: 140 mEq/L (ref 136–145)
Total Bilirubin: 0.57 mg/dL (ref 0.20–1.20)
Total Protein: 6.9 g/dL (ref 6.4–8.3)

## 2014-11-10 NOTE — Telephone Encounter (Addendum)
Gave avs & calendar for September. Gave CT contrast for CT scan. No POF sent

## 2014-11-10 NOTE — Progress Notes (Signed)
Hematology and Oncology Follow Up Visit  Jenny Soto [id number] [birth year] 64 y.o. 11/10/2014 3:28 PM  [name], M.D.  [place] Rosana Hoes, M.D.  [name], MD    Principle Diagnosis: A 64 year old female with advanced renal cell carcinoma initially presented in 2006.  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  Prior Therapy:  1. Status post nephrectomy in 2006.  However, she developed recurrent disease in 2009.  2. The patient underwent surgical resection, VATS procedure and wedge resection of the right lower lobe done in 2009.  Current therapy: Active surveillance.  Interim History: Jenny Soto presents today for a follow-up visit by herself. Since her last visit, she has no new complaints or issues. She continues to be very active and attends to her activities of daily living without any decline. Her back pain is better but exacerbated with strenuous exercise.    No weakness or loss of bladder or bowel function.  She has not reported any chills.  Performance status and activity level remain excellent.She is not reporting pulmonary symptoms at this time. No urinary symptoms on today's visit either. She continues to perform activities of daily living without any hindrance or decline. Has not reported any recent hospitalization or illnesses.She has not reported any headaches or blurry vision. Does not report any syncope or seizures. She reports no chest pain or leg edema. Does not report any lymphadenopathy or petechiae. The rest of symptoms review of systems unremarkable.  Medications: I have reviewed the patient's current medications. No current outpatient prescriptions on file.  Allergies: No Known Allergies  Past Medical History, Surgical history, Social history, and Family History were reviewed and updated.   Blood pressure 139/91, pulse 78, temperature 98 F (36.7 C), temperature source Oral, resp. rate 18, height 5\' 5"  (1.651 m), weight 149 lb 12.8 oz (67.949  kg), SpO2 100 %. ECOG: 0 General appearance: alert awake not in any distress. Head: Normocephalic, without obvious abnormality Neck: no adenopathy Lymph nodes: Cervical, supraclavicular, and axillary nodes normal. Heart:regular rate and rhythm, S1, S2 normal, no murmur, click, rub or gallop Lung:chest clear, no wheezing, rales, normal symmetric air entry Abdomin: soft, non-tender, without masses or organomegaly EXT:no erythema, induration, or nodules   Lab Results: Lab Results  Component Value Date   WBC 5.7 11/10/2014   HGB 13.3 11/10/2014   HCT 40.5 11/10/2014   MCV 99.8 11/10/2014   PLT 297 11/10/2014     Chemistry      Component Value Date/Time   NA 139 05/10/2014 0934   NA 140 11/07/2011 1522   NA 144 05/09/2011 0929   K 4.8 05/10/2014 0934   K 4.2 11/07/2011 1522   K 5.3* 05/09/2011 0929   CL 105 11/11/2012 0956   CL 104 11/07/2011 1522   CL 105 05/09/2011 0929   CO2 23 05/10/2014 0934   CO2 28 11/07/2011 1522   CO2 25 05/09/2011 0929   BUN 17.1 05/10/2014 0934   BUN 16 11/07/2011 1522   BUN 14 05/09/2011 0929   CREATININE 1.0 05/10/2014 0934   CREATININE 1.11* 11/07/2011 1522   CREATININE 1.0 05/09/2011 0929      Component Value Date/Time   CALCIUM 9.8 05/10/2014 0934   CALCIUM 10.0 11/07/2011 1522   CALCIUM 9.3 05/09/2011 0929   ALKPHOS 60 05/10/2014 0934   ALKPHOS 62 11/07/2011 1522   ALKPHOS 55 05/09/2011 0929   AST 24 05/10/2014 0934   AST 20 11/07/2011 1522   AST  30 05/09/2011 0929   ALT 20 05/10/2014 0934   ALT 16 11/07/2011 1522   ALT 26 05/09/2011 0929   BILITOT 0.47 05/10/2014 0934   BILITOT 0.3 11/07/2011 1522   BILITOT 0.60 05/09/2011 0929       Impression and Plan:  A 64 year old female with the following issues. 1. Renal cell carcinoma initially diagnosed at a T3 disease status post nephrectomy in 2006.  She had a pulmonary metastasis resected in 2009.  She has continued to have stage IV NAD disease. A CT scan from September of  2015  showed no evidence of disease.  The plan is to continue with observation and surveillance. I will repeat her CT scan in 6 months and on an annual basis. 2. Left sided lower back pain: Likely due spinal stenosis, herniated disk. MRI was done in 10/2012. She is doing better and following up with a spine specialist.   3. Age-appropriate cancer screening: She is up-to-date on her mammography as well.  [facility], MD 3/17/20163:28 PM

## 2014-12-16 ENCOUNTER — Encounter: Payer: Self-pay | Admitting: Internal Medicine

## 2015-05-17 ENCOUNTER — Encounter (HOSPITAL_COMMUNITY): Payer: Self-pay

## 2015-05-17 ENCOUNTER — Other Ambulatory Visit: Payer: Self-pay | Admitting: Oncology

## 2015-05-17 ENCOUNTER — Other Ambulatory Visit (HOSPITAL_BASED_OUTPATIENT_CLINIC_OR_DEPARTMENT_OTHER): Payer: BC Managed Care – PPO

## 2015-05-17 ENCOUNTER — Ambulatory Visit (HOSPITAL_COMMUNITY)
Admission: RE | Admit: 2015-05-17 | Discharge: 2015-05-17 | Disposition: A | Discharge status: Home / Self Care | Payer: BC Managed Care – PPO | Source: Ambulatory Visit | Attending: Oncology | Admitting: Oncology

## 2015-05-17 DIAGNOSIS — K5730 Diverticulosis of large intestine without perforation or abscess without bleeding: Secondary | ICD-10-CM | POA: Insufficient documentation

## 2015-05-17 DIAGNOSIS — I700 Atherosclerosis of aorta: Secondary | ICD-10-CM | POA: Insufficient documentation

## 2015-05-17 DIAGNOSIS — C649 Malignant neoplasm of unspecified kidney, except renal pelvis: Secondary | ICD-10-CM | POA: Diagnosis not present

## 2015-05-17 DIAGNOSIS — Z905 Acquired absence of kidney: Secondary | ICD-10-CM | POA: Diagnosis not present

## 2015-05-17 DIAGNOSIS — N281 Cyst of kidney, acquired: Secondary | ICD-10-CM | POA: Diagnosis not present

## 2015-05-17 DIAGNOSIS — Z9081 Acquired absence of spleen: Secondary | ICD-10-CM | POA: Diagnosis not present

## 2015-05-17 DIAGNOSIS — C642 Malignant neoplasm of left kidney, except renal pelvis: Secondary | ICD-10-CM | POA: Insufficient documentation

## 2015-05-17 DIAGNOSIS — Z902 Acquired absence of lung [part of]: Secondary | ICD-10-CM | POA: Diagnosis not present

## 2015-05-17 LAB — COMPREHENSIVE METABOLIC PANEL (CC13)
ALT: 24 U/L (ref 0–55)
ANION GAP: 7 meq/L (ref 3–11)
AST: 23 U/L (ref 5–34)
Albumin: 4 g/dL (ref 3.5–5.0)
Alkaline Phosphatase: 66 U/L (ref 40–150)
BILIRUBIN TOTAL: 0.61 mg/dL (ref 0.20–1.20)
BUN: 10.6 mg/dL (ref 7.0–26.0)
CHLORIDE: 107 meq/L (ref 98–109)
CO2: 27 meq/L (ref 22–29)
Calcium: 9.7 mg/dL (ref 8.4–10.4)
Creatinine: 1 mg/dL (ref 0.6–1.1)
EGFR: 63 mL/min/{1.73_m2} — ABNORMAL LOW (ref >90)
Glucose: 109 mg/dl (ref 70–140)
POTASSIUM: 4.7 meq/L (ref 3.5–5.1)
SODIUM: 141 meq/L (ref 136–145)
TOTAL PROTEIN: 7.2 g/dL (ref 6.4–8.3)

## 2015-05-17 LAB — CBC WITH DIFFERENTIAL/PLATELET
BASO%: 1.3 % (ref 0.0–2.0)
Basophils Absolute: 0.1 10*3/uL (ref 0.0–0.1)
EOS%: 7.7 % — ABNORMAL HIGH (ref 0.0–7.0)
Eosinophils Absolute: 0.5 10*3/uL (ref 0.0–0.5)
HCT: 41.7 % (ref 34.8–46.6)
HGB: 13.9 g/dL (ref 11.6–15.9)
LYMPH%: 40.7 % (ref 14.0–49.7)
MCH: 33.1 pg (ref 25.1–34.0)
MCHC: 33.3 g/dL (ref 31.5–36.0)
MCV: 99.4 fL (ref 79.5–101.0)
MONO#: 0.6 10*3/uL (ref 0.1–0.9)
MONO%: 11 % (ref 0.0–14.0)
NEUT%: 39.3 % (ref 38.4–76.8)
NEUTROS ABS: 2.3 10*3/uL (ref 1.5–6.5)
Platelets: 293 10*3/uL (ref 145–400)
RBC: 4.2 10*6/uL (ref 3.70–5.45)
RDW: 14.2 % (ref 11.2–14.5)
WBC: 5.9 10*3/uL (ref 3.9–10.3)
lymph#: 2.4 10*3/uL (ref 0.9–3.3)

## 2015-05-17 MED ORDER — IOHEXOL 300 MG/ML  SOLN
100.0000 mL | Freq: Once | INTRAMUSCULAR | Status: AC | PRN
  Administered 2015-05-17: 100 mL via INTRAVENOUS

## 2015-05-19 ENCOUNTER — Ambulatory Visit (HOSPITAL_BASED_OUTPATIENT_CLINIC_OR_DEPARTMENT_OTHER): Payer: BC Managed Care – PPO | Admitting: Oncology

## 2015-05-19 ENCOUNTER — Telehealth: Payer: Self-pay | Admitting: Oncology

## 2015-05-19 VITALS — BP 130/74 | HR 73 | Temp 97.8°F | Resp 20 | Ht 65.0 in | Wt 151.3 lb

## 2015-05-19 DIAGNOSIS — C649 Malignant neoplasm of unspecified kidney, except renal pelvis: Secondary | ICD-10-CM | POA: Diagnosis not present

## 2015-05-19 NOTE — Telephone Encounter (Signed)
Pt confirmed labs/ov per 09/23 POF, gave pt AVS and Calendar... KJ °

## 2015-05-19 NOTE — Progress Notes (Signed)
Hematology and Oncology Follow Up Visit  Jenny Soto [id number] [birth year] 64 y.o. 05/19/2015 1:22 PM  [name], M.D.  [place] Rosana Hoes, M.D.  [name], MD    Principle Diagnosis: A 64 year old female with advanced renal cell carcinoma initially presented in 2006.  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  Prior Therapy:  1. Status post nephrectomy in 2006.  However, she developed recurrent disease in 2009.  2. The patient underwent surgical resection, VATS procedure and wedge resection of the right lower lobe done in 2009.  Current therapy: Active surveillance.  Interim History: Jenny Soto presents today for a follow-up visit with her husband. Since her last visit, she continues to do well without recent complaints. She reports that she had a [place] and has been physically able to do it. She has not reported any back pain or shoulder pain. Has not reported any constitutional symptoms.  She is not reporting pulmonary symptoms at this time including cough, shortness of breath or hemoptysis. She has not reported any headaches or blurry vision. Does not report any syncope or seizures. She reports no chest pain or leg edema. Does not report any lymphadenopathy or petechiae. She has not reported any frequency, urgency or hesitancy. The rest of symptoms review of systems unremarkable.  Medications: I have reviewed the patient's current medications. No current outpatient prescriptions on file.  Allergies: No Known Allergies  Past Medical History, Surgical history, Social history, and Family History were reviewed and updated.   Blood pressure 130/74, pulse 73, temperature 97.8 F (36.6 C), temperature source Oral, resp. rate 20, height 5\' 5"  (1.651 m), weight 151 lb 4.8 oz (68.629 kg), SpO2 98 %. ECOG: 0 General appearance: alert awake well-appearing woman. Head: Normocephalic, without obvious abnormality no oral ulcers or  lesions. Neck: no adenopathy Lymph nodes: Cervical, supraclavicular, and axillary nodes normal. Heart:regular rate and rhythm, S1, S2 normal, no murmur, click, rub or gallop Lung:chest clear, no wheezing, rales, normal symmetric air entry no dullness to percussion. Abdomin: soft, non-tender, without masses or organomegaly  No shifting dullness. EXT:no erythema, induration, or nodules   Lab Results: Lab Results  Component Value Date   WBC 5.9 05/17/2015   HGB 13.9 05/17/2015   HCT 41.7 05/17/2015   MCV 99.4 05/17/2015   PLT 293 05/17/2015     Chemistry      Component Value Date/Time   NA 141 05/17/2015 1019   NA 140 11/07/2011 1522   NA 144 05/09/2011 0929   K 4.7 05/17/2015 1019   K 4.2 11/07/2011 1522   K 5.3* 05/09/2011 0929   CL 105 11/11/2012 0956   CL 104 11/07/2011 1522   CL 105 05/09/2011 0929   CO2 27 05/17/2015 1019   CO2 28 11/07/2011 1522   CO2 25 05/09/2011 0929   BUN 10.6 05/17/2015 1019   BUN 16 11/07/2011 1522   BUN 14 05/09/2011 0929   CREATININE 1.0 05/17/2015 1019   CREATININE 1.11* 11/07/2011 1522   CREATININE 1.0 05/09/2011 0929      Component Value Date/Time   CALCIUM 9.7 05/17/2015 1019   CALCIUM 10.0 11/07/2011 1522   CALCIUM 9.3 05/09/2011 0929   ALKPHOS 66 05/17/2015 1019   ALKPHOS 62 11/07/2011 1522   ALKPHOS 55 05/09/2011 0929   AST 23 05/17/2015 1019   AST 20 11/07/2011 1522   AST 30 05/09/2011 0929   ALT 24 05/17/2015 1019   ALT  16 11/07/2011 1522   ALT 26 05/09/2011 0929   BILITOT 0.61 05/17/2015 1019   BILITOT 0.3 11/07/2011 1522   BILITOT 0.60 05/09/2011 0929     EXAM: CT CHEST, ABDOMEN, AND PELVIS WITH CONTRAST  TECHNIQUE: Multidetector CT imaging of the chest, abdomen and pelvis was performed following the standard protocol during bolus administration of intravenous contrast.  CONTRAST: 156mL OMNIPAQUE IOHEXOL 300 MG/ML SOLN  COMPARISON: 05/10/2014 CT of the chest, abdomen and pelvis.  FINDINGS: CT CHEST  FINDINGS  Mediastinum/Nodes: Normal heart size. No pericardial fluid/thickening. Great vessels are normal in course and caliber. No central pulmonary emboli. Normal visualized thyroid. Normal esophagus. No axillary, mediastinal or hilar lymphadenopathy.  Lungs/Pleura: No pneumothorax. No pleural effusion. Stable minimal scarring in medial basilar right lower lobe from prior wedge resection. No acute consolidative airspace disease. Peripheral left upper lobe 5 mm and 3 mm ground-glass pulmonary nodules (series 8/ image 43) are unchanged since 09/18/2005 CT, and are benign. No new significant pulmonary nodules.  Musculoskeletal: Mild degenerative changes in the thoracic spine. No aggressive appearing focal osseous lesions.  CT ABDOMEN AND PELVIS FINDINGS  Hepatobiliary: There are 4 scattered subcentimeter hypodense lesions in the liver, too small to characterize, unchanged since at least 05/08/2010 CT, in keeping with benign etiology. Otherwise normal liver, with no new liver lesions. Status post cholecystectomy. No biliary ductal dilatation.  Pancreas: Normal, with no mass or duct dilation.  Spleen: Status post splenectomy. A 1.6 x 1.5 cm circumscribed round mass in the splenectomy bed (series 6/ image 89) is unchanged, most in keeping with a splenule. 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  Adrenals/Urinary Tract: Normal adrenals. Stable 0.8 cm simple renal cyst in the medial upper right kidney. There is a stable 1.2 cm complex cystic lesion with thin internal septation in the anterior lower right kidney (series 604/image 44), unchanged since at least 11/14/2009 CT, suggesting a benign lesion. Otherwise normal right kidney, with no right hydronephrosis and no right nephrolithiasis. No new right renal lesions. Normal caliber right ureter. Status post left nephrectomy, with no  mass or fluid collection in the left nephrectomy bed. Patent right renal. Normal urinary bladder.  Stomach/Bowel: Collapsed and grossly normal stomach. Normal caliber small bowel, with no small bowel wall thickening. Normal appendix. Mild sigmoid diverticulosis. No large bowel wall thickening or pericolonic fat stranding.  Vascular/Lymphatic: Atherosclerotic nonaneurysmal abdominal aorta. Patent hepatic and portal veins. No abdominopelvic lymphadenopathy.  Reproductive: Stable normal size anteverted uterus with coarse internal calcifications likely representing degenerated small fibroids. No adnexal mass.  Other: No pneumoperitoneum, ascites or focal fluid collection.  Musculoskeletal: No aggressive appearing focal osseous lesions.  IMPRESSION: 1. No evidence of metastatic disease in the chest, abdomen or pelvis. 2. Status post left nephrectomy, with no evidence of local tumor recurrence. 3. Complex 1.2 cm lower right renal cysts, stable since 2011, suggesting a benign lesion.   Impression and Plan:  A 64 year old female with the following issues. 1. Renal cell carcinoma initially diagnosed at a T3 disease status post nephrectomy in 2006.  She had a pulmonary metastasis resected in 2009. CT scan from September of 05/17/2015 was reviewed today and showed no cancer recurrence. I see no role for systemic therapy at this time unless she develops clear-cut progression of disease. The plan is to continue with observation and surveillance. Risks and benefits of continuing imaging studies was reviewed today and she is agreeable to  continue at this time. I plan on repeat physical examination in 6 months and imaging studies in 12 months. 2. Left sided lower back pain: Likely due spinal stenosis, herniated disk. MRI was done in 10/2012. No longer reporting pain at this time. 3. Age-appropriate cancer screening: She is up-to-date on her mammography as well. 4. Follow-up: Will be in 6  months.  Zola Button, MD 9/23/20161:22 PM

## 2015-09-04 ENCOUNTER — Telehealth: Payer: Self-pay | Admitting: *Deleted

## 2015-09-04 NOTE — Telephone Encounter (Signed)
"  I have a new development and need to Dr. Alen Blew or his nurse to advise me.  I am not scheduled to be seen until 11-09-2015.  I have a lumpy area in my abdomen that's about 4 inches above my navel and below the sternum.  I noticed it a couple months ago.  It wasn't there when I had scans in September.  I've watched it and it hasn't gone away is why I'm calling.  I notice pressure but no pian.  It's the size of a small hen's egg.  No change in color of skin.  No movement.  It stays put between this area.  Return number [id number]."

## 2015-09-05 ENCOUNTER — Telehealth: Payer: Self-pay | Admitting: Oncology

## 2015-09-05 ENCOUNTER — Other Ambulatory Visit: Payer: Self-pay | Admitting: Oncology

## 2015-09-05 DIAGNOSIS — C649 Malignant neoplasm of unspecified kidney, except renal pelvis: Secondary | ICD-10-CM

## 2015-09-05 NOTE — Telephone Encounter (Signed)
Pt confirmed future appt regarding ct scan and md appt..  Pt will come and pick up contrast.

## 2015-09-06 ENCOUNTER — Telehealth: Payer: Self-pay | Admitting: Oncology

## 2015-09-06 NOTE — Telephone Encounter (Signed)
LEFT MESSAGE FOR PATIENT CONFIRMING APPOINTMENTS FOR 1/12 AND 1/19.

## 2015-09-07 ENCOUNTER — Ambulatory Visit (HOSPITAL_COMMUNITY)
Admission: RE | Admit: 2015-09-07 | Discharge: 2015-09-07 | Disposition: A | Discharge status: Home / Self Care | Payer: BC Managed Care – PPO | Source: Ambulatory Visit | Attending: Oncology | Admitting: Oncology

## 2015-09-07 ENCOUNTER — Other Ambulatory Visit (HOSPITAL_BASED_OUTPATIENT_CLINIC_OR_DEPARTMENT_OTHER): Payer: BC Managed Care – PPO

## 2015-09-07 ENCOUNTER — Other Ambulatory Visit: Payer: Self-pay | Admitting: Oncology

## 2015-09-07 ENCOUNTER — Encounter (HOSPITAL_COMMUNITY): Payer: Self-pay

## 2015-09-07 ENCOUNTER — Telehealth: Payer: Self-pay | Admitting: *Deleted

## 2015-09-07 DIAGNOSIS — C7801 Secondary malignant neoplasm of right lung: Secondary | ICD-10-CM | POA: Diagnosis not present

## 2015-09-07 DIAGNOSIS — C649 Malignant neoplasm of unspecified kidney, except renal pelvis: Secondary | ICD-10-CM

## 2015-09-07 DIAGNOSIS — Z902 Acquired absence of lung [part of]: Secondary | ICD-10-CM | POA: Insufficient documentation

## 2015-09-07 DIAGNOSIS — K439 Ventral hernia without obstruction or gangrene: Secondary | ICD-10-CM | POA: Insufficient documentation

## 2015-09-07 DIAGNOSIS — Z905 Acquired absence of kidney: Secondary | ICD-10-CM | POA: Diagnosis not present

## 2015-09-07 LAB — CBC WITH DIFFERENTIAL/PLATELET
BASO%: 1.3 % (ref 0.0–2.0)
Basophils Absolute: 0.1 10*3/uL (ref 0.0–0.1)
EOS ABS: 0.2 10*3/uL (ref 0.0–0.5)
EOS%: 4.1 % (ref 0.0–7.0)
HEMATOCRIT: 43.2 % (ref 34.8–46.6)
HGB: 14.1 g/dL (ref 11.6–15.9)
LYMPH#: 2.3 10*3/uL (ref 0.9–3.3)
LYMPH%: 41 % (ref 14.0–49.7)
MCH: 32 pg (ref 25.1–34.0)
MCHC: 32.6 g/dL (ref 31.5–36.0)
MCV: 98 fL (ref 79.5–101.0)
MONO#: 0.6 10*3/uL (ref 0.1–0.9)
MONO%: 10.1 % (ref 0.0–14.0)
NEUT%: 43.5 % (ref 38.4–76.8)
NEUTROS ABS: 2.4 10*3/uL (ref 1.5–6.5)
PLATELETS: 285 10*3/uL (ref 145–400)
RBC: 4.4 10*6/uL (ref 3.70–5.45)
RDW: 13.4 % (ref 11.2–14.5)
WBC: 5.6 10*3/uL (ref 3.9–10.3)

## 2015-09-07 LAB — COMPREHENSIVE METABOLIC PANEL
ALT: 23 U/L (ref 0–55)
AST: 25 U/L (ref 5–34)
Albumin: 4.2 g/dL (ref 3.5–5.0)
Alkaline Phosphatase: 57 U/L (ref 40–150)
Anion Gap: 9 mEq/L (ref 3–11)
BUN: 16 mg/dL (ref 7.0–26.0)
CHLORIDE: 105 meq/L (ref 98–109)
CO2: 24 meq/L (ref 22–29)
CREATININE: 1.2 mg/dL — AB (ref 0.6–1.1)
Calcium: 10 mg/dL (ref 8.4–10.4)
EGFR: 50 mL/min/{1.73_m2} — ABNORMAL LOW (ref >90)
GLUCOSE: 110 mg/dL (ref 70–140)
Potassium: 4.7 mEq/L (ref 3.5–5.1)
SODIUM: 139 meq/L (ref 136–145)
Total Bilirubin: 0.6 mg/dL (ref 0.20–1.20)
Total Protein: 7.7 g/dL (ref 6.4–8.3)

## 2015-09-07 MED ORDER — IOHEXOL 300 MG/ML  SOLN
100.0000 mL | Freq: Once | INTRAMUSCULAR | Status: AC | PRN
  Administered 2015-09-07: 100 mL via INTRAVENOUS

## 2015-09-07 NOTE — Telephone Encounter (Signed)
As noted below by Dr. Alen Blew, I informed patient of her CT scan results. Patient verbalized understanding.

## 2015-09-07 NOTE — Telephone Encounter (Signed)
Telephone Encounter   EVERLENA ROEL (MR# [id number])      Telephone Encounter Info    Author Note Status Last Update User Last Update Date/Time   Theodosia Quay Signed Theodosia Quay 09/06/2015 12:59 PM    Telephone Encounter    Expand All Collapse All   LEFT MESSAGE FOR PATIENT CONFIRMING APPOINTMENTS FOR 1/12 AND 1/19.

## 2015-09-07 NOTE — Telephone Encounter (Signed)
-----   Message from Wyatt Portela, MD sent at 09/07/2015  2:46 PM EST ----- Please let her know that the scan is normal without cancer. She likely has a hernia.  She can keep her appointment with me next week to discuss further.

## 2015-09-14 ENCOUNTER — Telehealth: Payer: Self-pay | Admitting: Oncology

## 2015-09-14 ENCOUNTER — Ambulatory Visit (HOSPITAL_BASED_OUTPATIENT_CLINIC_OR_DEPARTMENT_OTHER): Payer: BC Managed Care – PPO | Admitting: Oncology

## 2015-09-14 VITALS — BP 138/77 | HR 73 | Temp 97.4°F | Resp 18 | Ht 65.0 in | Wt 151.0 lb

## 2015-09-14 DIAGNOSIS — K439 Ventral hernia without obstruction or gangrene: Secondary | ICD-10-CM

## 2015-09-14 DIAGNOSIS — Z85528 Personal history of other malignant neoplasm of kidney: Secondary | ICD-10-CM | POA: Diagnosis not present

## 2015-09-14 NOTE — Telephone Encounter (Signed)
per pof to sch pt appt-sch pt referral appt w/[facility]-gave pt time /date/location

## 2015-09-14 NOTE — Progress Notes (Signed)
Hematology and Oncology Follow Up Visit  Jenny Soto [id number] 1951/03/28 65 y.o. 09/14/2015 1:33 PM  Jenny Soto, M.D.  Jenny Soto, M.D.  Jenny Amass, MD    Principle Diagnosis: A 65 year old female with advanced renal cell carcinoma initially presented in 2006.  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  Prior Therapy:  1. Status post nephrectomy in 2006.  However, she developed recurrent disease in 2009.  2. The patient underwent surgical resection, VATS procedure and wedge resection of the right lower lobe done in 2009.  Current therapy: Active surveillance.  Interim History: [name] presents today for a follow-up visit. Since her last visit, she developed a protrusion the midline of the abdomen suggestive of a hernia although it is exacerbated by food. She started noticing and in November 2016 and was exacerbated by her exercise and yoga. Now he is persistent and reducible. She reports no abdominal pain but feels aching specially after food. She denied any nausea, vomiting or change in her bowel habits. She continues to be active without any decline.   She is not reporting pulmonary symptoms at this time including cough, shortness of breath or hemoptysis. She has not reported any headaches or blurry vision. Does not report any syncope or seizures. She reports no chest pain or leg edema. Does not report any lymphadenopathy or petechiae. She has not reported any frequency, urgency or hesitancy. The rest of symptoms review of systems unremarkable.  Medications: I have reviewed the patient's current medications. No current outpatient prescriptions on file.  Allergies: No Known Allergies  Past Medical History, Surgical history, Social history, and Family History were reviewed and updated.   Blood pressure 138/77, pulse 73, temperature 97.4 F (36.3 C), temperature source Oral, resp. rate 18, height 5\' 5"  (1.651 m), weight 151 lb (68.493 kg), SpO2 100  %. ECOG: 0 General appearance: alert awake without distress. Head: Normocephalic, without obvious abnormality no oral thrush. Neck: no adenopathy Lymph nodes: Cervical, supraclavicular, and axillary nodes normal. Heart:regular rate and rhythm, S1, S2 normal, no murmur, click, rub or gallop Lung:chest clear, no wheezing, rales, normal symmetric air entry no dullness to percussion. Abdomin: soft, non-tender, without masses or organomegaly  small protrusion noted in the epigastric area. Reducible with gentle pressure. EXT:no erythema, induration, or nodules   Lab Results: Lab Results  Component Value Date   WBC 5.6 09/07/2015   HGB 14.1 09/07/2015   HCT 43.2 09/07/2015   MCV 98.0 09/07/2015   PLT 285 09/07/2015     Chemistry      Component Value Date/Time   NA 139 09/07/2015 1145   NA 140 11/07/2011 1522   NA 144 05/09/2011 0929   K 4.7 09/07/2015 1145   K 4.2 11/07/2011 1522   K 5.3* 05/09/2011 0929   CL 105 11/11/2012 0956   CL 104 11/07/2011 1522   CL 105 05/09/2011 0929   CO2 24 09/07/2015 1145   CO2 28 11/07/2011 1522   CO2 25 05/09/2011 0929   BUN 16.0 09/07/2015 1145   BUN 16 11/07/2011 1522   BUN 14 05/09/2011 0929   CREATININE 1.2* 09/07/2015 1145   CREATININE 1.11* 11/07/2011 1522   CREATININE 1.0 05/09/2011 0929      Component Value Date/Time   CALCIUM 10.0 09/07/2015 1145   CALCIUM 10.0 11/07/2011 1522   CALCIUM 9.3 05/09/2011 0929   ALKPHOS 57 09/07/2015 1145   ALKPHOS 62 11/07/2011 1522   ALKPHOS 55 05/09/2011 0929  AST 25 09/07/2015 1145   AST 20 11/07/2011 1522   AST 30 05/09/2011 0929   ALT 23 09/07/2015 1145   ALT 16 11/07/2011 1522   ALT 26 05/09/2011 0929   BILITOT 0.60 09/07/2015 1145   BILITOT 0.3 11/07/2011 1522   BILITOT 0.60 05/09/2011 0929     EXAM: CT CHEST WITH CONTRAST  CT ABDOMEN PELVIS, CT ABDOMEN WITH AND WITHOUT CONTRAST  TECHNIQUE: Multidetector CT imaging of the chest abdomen and pelvis was performed during  intravenous contrast administration. Multidetector CT imaging of the abdomen was performed following the standard protocol before administration of intravenous contrast.  CONTRAST: 176mL OMNIPAQUE IOHEXOL 300 MG/ML SOLN  COMPARISON: 05/17/2015  FINDINGS: CT CHEST FINDINGS  Mediastinum/Nodes: No supraclavicular adenopathy. Normal heart size, without pericardial effusion. No central pulmonary embolism, on this non-dedicated study. No mediastinal or hilar adenopathy.  Lungs/Pleura: No pleural fluid. Right lower lobe surgical changes with volume loss. Similar posterior left upper low mild nodularity and ground-glass opacity, including on images 43 and 44/series 9. No suspicious pulmonary nodule or mass.  Musculoskeletal: No acute osseous abnormality.  CT ABDOMEN PELVIS FINDINGS  Hepatobiliary: Scattered well-circumscribed tiny low-density liver lesions are unchanged, favored to represent tiny cysts. No suspicious liver lesion. Probable Riedel's lobe. Cholecystectomy, without biliary ductal dilatation.  Pancreas: Normal, without mass or ductal dilatation.  Spleen: Status post splenectomy with residual splenic tissue in the left upper quadrant.  Adrenals/Urinary Tract: Normal adrenal glands. Left nephrectomy, without locally recurrent disease. Too small to characterize lesions in the right kidney. Lower pole complex cystic lesion versus 2 adjacent lesions. Similar 1.2 cm on coronal image 45. No hydronephrosis or hydroureter. Normal urinary bladder.  Stomach/Bowel: Normal stomach, without wall thickening. Scattered colonic diverticula. Normal terminal ileum and appendix. Normal small bowel.  Vascular/Lymphatic: Multiple right renal arteries. Aortic atherosclerosis. Tiny splenic artery tip aneurysm, as before. 7 mm. No abdominopelvic adenopathy.  Reproductive: Small uterine calcifications are similar and likely due to fibroids. No adnexal mass.  Other: No  significant free fluid. high abdominal fat containing ventral abdominal wall hernia is unchanged, including on image 108/ series 6. hernia neck measures 9 mm.  Musculoskeletal: No acute osseous abnormality.  IMPRESSION: 1. Status post left nephrectomy, without recurrent or metastatic disease. 2. Fat containing ventral abdominal wall hernia is similar. No acute complication. 3. Surgical changes in the right lower lobe. No new pulmonary metastasis identified.      Impression and Plan:  A 65 year old female with the following issues. 1. Renal cell carcinoma initially diagnosed at a T3 disease status post nephrectomy in 2006.  She had a pulmonary metastasis resected in 2009. CT scan from 09/07/2015 was reviewed today and showed no cancer recurrence. The plan is to continue with active surveillance and repeat imaging studies in one year unless other symptoms develop. 2. Ventral hernia noted on exam as well as on CT scan. There is no evidence of strangulation or complications at this time I may be she is at risk of developing that in the future. I will send her to Gen. surgery for an evaluation for possible repair if it's indicated. 3. Age-appropriate cancer screening: She is up-to-date on her mammography as well. 4. Follow-up: Will be in 06/2016  [facility], MD 1/19/20171:33 PM

## 2015-09-19 ENCOUNTER — Ambulatory Visit: Payer: Self-pay | Admitting: General Surgery

## 2015-09-19 NOTE — H&P (Signed)
History of Present Illness Jenny Soto; 09/19/2015 3:51 PM) The patient is a 65 year old female who presents with an incisional hernia. 65 year old female who is referred by Dr. Zola Button for evaluation of a incisional hernia. Patient had a left nephrectomy secondary to renal cell carcinoma years ago. Patient states that in November of this past year she was moving some boxes and felt a small 8 to area. Subsequent to that she noticed a bulge in the area. Patient underwent a CT scan which revealed a ventral incisional hernia. This repaired the medial aspect of her previous nephrectomy incision.  Patient is active, does some light work out, and yoga.   Other Problems Elbert Ewings, CMA; 09/19/2015 3:36 PM) Arthritis Back Pain Bladder Problems Cancer General anesthesia - complications Hemorrhoids Hypercholesterolemia Lung Cancer Ventral Hernia Repair  Past Surgical History Elbert Ewings, CMA; 09/19/2015 3:36 PM) Gallbladder Surgery - Laparoscopic Lung Surgery Right. Nephrectomy Left. Oral Surgery Splenectomy  Diagnostic Studies History Elbert Ewings, [place]; 09/19/2015 3:36 PM) Colonoscopy 1-5 years ago Mammogram 1-3 years ago Pap Smear 1-5 years ago  Allergies Elbert Ewings, CMA; 09/19/2015 3:36 PM) No Known Drug Allergies01/24/2017  Medication History Elbert Ewings, CMA; 09/19/2015 3:36 PM) No Current Medications Medications Reconciled  Social History Elbert Ewings, CMA; 09/19/2015 3:36 PM) Alcohol use Moderate alcohol use. Caffeine use Coffee. No drug use Tobacco use Former smoker.  Family History Elbert Ewings, [place]; 09/19/2015 3:36 PM) Family history unknown First Degree Relatives  Pregnancy / Birth History Elbert Ewings, [place]; 09/19/2015 3:36 PM) Age at menarche 87 years. Age of menopause 108-60 Contraceptive History Oral contraceptives. Gravida 3 Irregular periods Maternal age 73-25 Para 2    Review of Systems Elbert Ewings CMA;  09/19/2015 3:36 PM) General Not Present- Appetite Loss, Chills, Fatigue, Fever, Night Sweats, Weight Gain and Weight Loss. Skin Not Present- Change in Wart/Mole, Dryness, Hives, Jaundice, New Lesions, Non-Healing Wounds, Rash and Ulcer. HEENT Not Present- Earache, Hearing Loss, Hoarseness, Nose Bleed, Oral Ulcers, Ringing in the Ears, Seasonal Allergies, Sinus Pain, Sore Throat, Visual Disturbances, Wears glasses/contact lenses and Yellow Eyes. Respiratory Not Present- Bloody sputum, Chronic Cough, Difficulty Breathing, Snoring and Wheezing. Breast Not Present- Breast Mass, Breast Pain, Nipple Discharge and Skin Changes. Cardiovascular Not Present- Chest Pain, Difficulty Breathing Lying Down, Leg Cramps, Palpitations, Rapid Heart Rate, Shortness of Breath and Swelling of Extremities. Gastrointestinal Present- Abdominal Pain and Hemorrhoids. Not Present- Bloating, Bloody Stool, Change in Bowel Habits, Chronic diarrhea, Constipation, Difficulty Swallowing, Excessive gas, Gets full quickly at meals, Indigestion, Nausea, Rectal Pain and Vomiting. Female Genitourinary Not Present- Frequency, Nocturia, Painful Urination, Pelvic Pain and Urgency. Musculoskeletal Not Present- Back Pain, Joint Pain, Joint Stiffness, Muscle Pain, Muscle Weakness and Swelling of Extremities. Neurological Not Present- Decreased Memory, Fainting, Headaches, Numbness, Seizures, Tingling, Tremor, Trouble walking and Weakness. Psychiatric Not Present- Anxiety, Bipolar, Change in Sleep Pattern, Depression, Fearful and Frequent crying. Endocrine Not Present- Cold Intolerance, Excessive Hunger, Hair Changes, Heat Intolerance, Hot flashes and New Diabetes. Hematology Not Present- Easy Bruising, Excessive bleeding, Gland problems, HIV and Persistent Infections.  Vitals Elbert Ewings CMA; 09/19/2015 3:36 PM) 09/19/2015 3:36 PM Weight: 147 lb Height: 65in Body Surface Area: 1.74 m Body Mass Index: 24.46 kg/m  Temp.:  97.21F(Temporal)  Pulse: 59 (Regular)  BP: 126/63 (Sitting, Left Arm, Standard)       Physical Exam Jenny Soto; 09/19/2015 3:51 PM) General Mental Status-Alert. General Appearance-Consistent with stated age. Hydration-Well hydrated. Voice-Normal.  Head and Neck Head-normocephalic, atraumatic with no lesions or palpable  masses. Trachea-midline. Thyroid Gland Characteristics - normal size and consistency.  Chest and Lung Exam Chest and lung exam reveals -quiet, even and easy respiratory effort with no use of accessory muscles and on auscultation, normal breath sounds, no adventitious sounds and normal vocal resonance. Inspection Chest Wall - Normal. Back - normal.  Cardiovascular Cardiovascular examination reveals -normal heart sounds, regular rate and rhythm with no murmurs and normal pedal pulses bilaterally.  Abdomen Inspection Hernias - Incisional - Reducible(Approximately 2 cm Large left nephrectomy incision site). Palpation/Percussion Normal exam - Soft, Non Tender, No Rebound tenderness, No Rigidity (guarding) and No hepatosplenomegaly. Auscultation Normal exam - Bowel sounds normal.    Assessment & Plan Jenny Soto; 09/19/2015 3:52 PM) INCISIONAL HERNIA, WITHOUT OBSTRUCTION OR GANGRENE (K43.2) Impression: 65 year old female with a epigastric incisional hernia.  1. 1. The patient will like to proceed to the operating room for laparoscopic incisional hernia repair.  2. I discussed with the patient the signs and symptoms of incarceration and strangulation and the need to proceed to the ER should they occur.  3. I discussed with the patient the risks and benefits of the procedure to include but not limited to: Infection, bleeding, damage to surrounding structures, possible need for further surgery, possible nerve pain, and possible recurrence. The patient was understanding and wishes to proceed.

## 2015-09-26 ENCOUNTER — Encounter: Payer: Self-pay | Admitting: Oncology

## 2015-09-27 ENCOUNTER — Encounter: Payer: Self-pay | Admitting: *Deleted

## 2015-10-02 ENCOUNTER — Encounter (HOSPITAL_COMMUNITY)
Admission: RE | Admit: 2015-10-02 | Discharge: 2015-10-02 | Disposition: A | Discharge status: Home / Self Care | Payer: BC Managed Care – PPO | Source: Ambulatory Visit | Attending: General Surgery | Admitting: General Surgery

## 2015-10-02 ENCOUNTER — Encounter (HOSPITAL_COMMUNITY): Payer: Self-pay

## 2015-10-02 DIAGNOSIS — J449 Chronic obstructive pulmonary disease, unspecified: Secondary | ICD-10-CM | POA: Insufficient documentation

## 2015-10-02 DIAGNOSIS — K432 Incisional hernia without obstruction or gangrene: Secondary | ICD-10-CM | POA: Diagnosis present

## 2015-10-02 DIAGNOSIS — Z87891 Personal history of nicotine dependence: Secondary | ICD-10-CM | POA: Diagnosis not present

## 2015-10-02 HISTORY — DX: Personal history of other diseases of the digestive system: Z87.19

## 2015-10-02 HISTORY — DX: Nausea with vomiting, unspecified: R11.2

## 2015-10-02 HISTORY — DX: Other specified postprocedural states: Z98.890

## 2015-10-02 LAB — CBC
HEMATOCRIT: 41.5 % (ref 36.0–46.0)
HEMOGLOBIN: 13.6 g/dL (ref 12.0–15.0)
MCH: 32.8 pg (ref 26.0–34.0)
MCHC: 32.8 g/dL (ref 30.0–36.0)
MCV: 100 fL (ref 78.0–100.0)
Platelets: 290 10*3/uL (ref 150–400)
RBC: 4.15 MIL/uL (ref 3.87–5.11)
RDW: 13.7 % (ref 11.5–15.5)
WBC: 7.2 10*3/uL (ref 4.0–10.5)

## 2015-10-02 LAB — BASIC METABOLIC PANEL
Anion gap: 14 (ref 5–15)
BUN: 16 mg/dL (ref 6–20)
CHLORIDE: 102 mmol/L (ref 101–111)
CO2: 25 mmol/L (ref 22–32)
CREATININE: 1.05 mg/dL — AB (ref 0.44–1.00)
Calcium: 9.8 mg/dL (ref 8.9–10.3)
GFR calc Af Amer: >60 mL/min (ref >60)
GFR calc non Af Amer: 56 mL/min — ABNORMAL LOW (ref >60)
Glucose, Bld: 108 mg/dL — ABNORMAL HIGH (ref 65–99)
Potassium: 4.5 mmol/L (ref 3.5–5.1)
Sodium: 141 mmol/L (ref 135–145)

## 2015-10-02 NOTE — Pre-Procedure Instructions (Signed)
    Jenny Soto  10/02/2015      [id number] - [place] ([place]), [place] [id number] [place]) [place] [address] Phone: [phone] Fax: [phone]  [id number] - [place], [place] [address] Phone: [phone] Fax: [phone]    Your procedure is scheduled on 10/04/15.  Report to [facility] Admitting at [facility] A.[facility].  Call this number if you have problems the morning of surgery:  [phone]   Remember:  Do not eat food or drink liquids after midnight.  Take these medicines the morning of surgery with A SIP OF WATER--none   Do not wear jewelry, make-up or nail polish.  Do not wear lotions, powders, or perfumes.  You may wear deodorant.  Do not shave 48 hours prior to surgery.  Men may shave face and neck.  Do not bring valuables to the hospital.  [facility] is not responsible for any belongings or valuables.  Contacts, dentures or bridgework may not be worn into surgery.  Leave your suitcase in the car.  After surgery it may be brought to your room.  For patients admitted to the hospital, discharge time will be determined by your treatment team.  Patients discharged the day of surgery will not be allowed to drive home.   Name and phone number of your driver:    Special instructions:    Please read over the following fact sheets that you were given. Pain Booklet, Coughing and Deep Breathing and Surgical Site Infection Prevention

## 2015-10-03 MED ORDER — CHLORHEXIDINE GLUCONATE 4 % EX LIQD: 1.0000 "application " | Freq: Once | CUTANEOUS | Status: DC

## 2015-10-03 MED ORDER — CEFAZOLIN SODIUM-DEXTROSE 2-3 GM-% IV SOLR
2.0000 g | INTRAVENOUS | Status: AC
  Administered 2015-10-04: 2 g via INTRAVENOUS

## 2015-10-04 ENCOUNTER — Encounter (HOSPITAL_COMMUNITY)
Admission: RE | Disposition: A | Discharge status: Home / Self Care | Payer: Self-pay | Source: Ambulatory Visit | Attending: General Surgery

## 2015-10-04 ENCOUNTER — Ambulatory Visit (HOSPITAL_COMMUNITY)
Admission: RE | Admit: 2015-10-04 | Discharge: 2015-10-04 | Disposition: A | Discharge status: Home / Self Care | Payer: BC Managed Care – PPO | Source: Ambulatory Visit | Attending: General Surgery | Admitting: General Surgery

## 2015-10-04 ENCOUNTER — Encounter (HOSPITAL_COMMUNITY): Payer: Self-pay | Admitting: Surgery

## 2015-10-04 ENCOUNTER — Ambulatory Visit (HOSPITAL_COMMUNITY): Payer: BC Managed Care – PPO | Admitting: Certified Registered Nurse Anesthetist

## 2015-10-04 ENCOUNTER — Other Ambulatory Visit: Payer: Self-pay | Admitting: General Surgery

## 2015-10-04 DIAGNOSIS — K432 Incisional hernia without obstruction or gangrene: Secondary | ICD-10-CM | POA: Diagnosis not present

## 2015-10-04 HISTORY — PX: INCISIONAL HERNIA REPAIR: SHX193

## 2015-10-04 HISTORY — PX: INSERTION OF MESH: SHX5868

## 2015-10-04 SURGERY — REPAIR, HERNIA, INCISIONAL, LAPAROSCOPIC
Anesthesia: General | Site: Abdomen

## 2015-10-04 MED ORDER — EPHEDRINE SULFATE 50 MG/ML IJ SOLN
INTRAMUSCULAR | Status: AC
  Filled 2015-10-04: qty 1

## 2015-10-04 MED ORDER — SCOPOLAMINE 1 MG/3DAYS TD PT72
MEDICATED_PATCH | TRANSDERMAL | Status: AC
  Administered 2015-10-04: 1.5 mg via TRANSDERMAL
  Filled 2015-10-04: qty 1

## 2015-10-04 MED ORDER — MIDAZOLAM HCL 5 MG/5ML IJ SOLN
INTRAMUSCULAR | Status: DC | PRN
  Administered 2015-10-04 (×2): 1 mg via INTRAVENOUS

## 2015-10-04 MED ORDER — ONDANSETRON 4 MG PO TBDP: 4.0000 mg | ORAL_TABLET | Freq: Three times a day (TID) | ORAL | Status: DC | PRN

## 2015-10-04 MED ORDER — SODIUM CHLORIDE 0.9% FLUSH: 3.0000 mL | INTRAVENOUS | Status: DC | PRN

## 2015-10-04 MED ORDER — PROMETHAZINE HCL 25 MG/ML IJ SOLN
INTRAMUSCULAR | Status: AC
  Filled 2015-10-04: qty 1

## 2015-10-04 MED ORDER — SCOPOLAMINE 1 MG/3DAYS TD PT72
1.0000 | MEDICATED_PATCH | TRANSDERMAL | Status: DC
  Administered 2015-10-04: 1.5 mg via TRANSDERMAL
  Filled 2015-10-04: qty 1

## 2015-10-04 MED ORDER — ROCURONIUM BROMIDE 100 MG/10ML IV SOLN
INTRAVENOUS | Status: DC | PRN
  Administered 2015-10-04: 30 mg via INTRAVENOUS

## 2015-10-04 MED ORDER — DEXAMETHASONE SODIUM PHOSPHATE 4 MG/ML IJ SOLN
INTRAMUSCULAR | Status: AC
  Filled 2015-10-04: qty 1

## 2015-10-04 MED ORDER — PHENYLEPHRINE HCL 10 MG/ML IJ SOLN
INTRAMUSCULAR | Status: DC | PRN
  Administered 2015-10-04 (×2): 40 ug via INTRAVENOUS

## 2015-10-04 MED ORDER — SCOPOLAMINE 1 MG/3DAYS TD PT72
MEDICATED_PATCH | TRANSDERMAL | Status: AC
  Filled 2015-10-04: qty 1

## 2015-10-04 MED ORDER — HYDROMORPHONE HCL 1 MG/ML IJ SOLN
INTRAMUSCULAR | Status: AC
  Filled 2015-10-04: qty 1

## 2015-10-04 MED ORDER — SODIUM CHLORIDE 0.9% FLUSH: 3.0000 mL | Freq: Two times a day (BID) | INTRAVENOUS | Status: DC

## 2015-10-04 MED ORDER — SUGAMMADEX SODIUM 200 MG/2ML IV SOLN
INTRAVENOUS | Status: DC | PRN
  Administered 2015-10-04: 200 mg via INTRAVENOUS

## 2015-10-04 MED ORDER — ONDANSETRON HCL 4 MG/2ML IJ SOLN
INTRAMUSCULAR | Status: DC | PRN
  Administered 2015-10-04: 4 mg via INTRAVENOUS

## 2015-10-04 MED ORDER — ACETAMINOPHEN 650 MG RE SUPP: 650.0000 mg | RECTAL | Status: DC | PRN

## 2015-10-04 MED ORDER — SUGAMMADEX SODIUM 200 MG/2ML IV SOLN
INTRAVENOUS | Status: AC
  Filled 2015-10-04: qty 2

## 2015-10-04 MED ORDER — LIDOCAINE HCL (CARDIAC) 20 MG/ML IV SOLN
INTRAVENOUS | Status: DC | PRN
  Administered 2015-10-04: 100 mg via INTRAVENOUS

## 2015-10-04 MED ORDER — ROCURONIUM BROMIDE 50 MG/5ML IV SOLN
INTRAVENOUS | Status: AC
  Filled 2015-10-04: qty 2

## 2015-10-04 MED ORDER — HYDROMORPHONE HCL 1 MG/ML IJ SOLN
0.2500 mg | INTRAMUSCULAR | Status: DC | PRN
  Administered 2015-10-04 (×4): 0.5 mg via INTRAVENOUS

## 2015-10-04 MED ORDER — LACTATED RINGERS IV SOLN
INTRAVENOUS | Status: DC
  Administered 2015-10-04 (×3): via INTRAVENOUS

## 2015-10-04 MED ORDER — MIDAZOLAM HCL 2 MG/2ML IJ SOLN
0.5000 mg | Freq: Once | INTRAMUSCULAR | Status: DC | PRN
Start: 2015-10-04 — End: 2015-10-04

## 2015-10-04 MED ORDER — OXYCODONE HCL 5 MG PO TABS
ORAL_TABLET | ORAL | Status: AC
  Filled 2015-10-04: qty 1

## 2015-10-04 MED ORDER — MEPERIDINE HCL 25 MG/ML IJ SOLN: 6.2500 mg | INTRAMUSCULAR | Status: DC | PRN

## 2015-10-04 MED ORDER — FENTANYL CITRATE (PF) 250 MCG/5ML IJ SOLN
INTRAMUSCULAR | Status: AC
  Filled 2015-10-04: qty 5

## 2015-10-04 MED ORDER — ARTIFICIAL TEARS OP OINT
TOPICAL_OINTMENT | OPHTHALMIC | Status: AC
  Filled 2015-10-04: qty 3.5

## 2015-10-04 MED ORDER — FENTANYL CITRATE (PF) 100 MCG/2ML IJ SOLN
INTRAMUSCULAR | Status: DC | PRN
  Administered 2015-10-04 (×5): 50 ug via INTRAVENOUS

## 2015-10-04 MED ORDER — ACETAMINOPHEN 325 MG PO TABS: 650.0000 mg | ORAL_TABLET | ORAL | Status: DC | PRN

## 2015-10-04 MED ORDER — BUPIVACAINE HCL (PF) 0.25 % IJ SOLN
INTRAMUSCULAR | Status: AC
  Filled 2015-10-04: qty 30

## 2015-10-04 MED ORDER — OXYCODONE HCL 5 MG PO TABS
5.0000 mg | ORAL_TABLET | ORAL | Status: DC | PRN
  Administered 2015-10-04: 5 mg via ORAL

## 2015-10-04 MED ORDER — BUPIVACAINE HCL 0.25 % IJ SOLN
INTRAMUSCULAR | Status: DC | PRN
  Administered 2015-10-04: 30 mL

## 2015-10-04 MED ORDER — ONDANSETRON HCL 4 MG/2ML IJ SOLN
INTRAMUSCULAR | Status: AC
  Filled 2015-10-04: qty 2

## 2015-10-04 MED ORDER — DEXAMETHASONE SODIUM PHOSPHATE 4 MG/ML IJ SOLN
INTRAMUSCULAR | Status: DC | PRN
  Administered 2015-10-04: 4 mg via INTRAVENOUS

## 2015-10-04 MED ORDER — PROPOFOL 10 MG/ML IV BOLUS
INTRAVENOUS | Status: DC | PRN
  Administered 2015-10-04: 150 mg via INTRAVENOUS

## 2015-10-04 MED ORDER — SODIUM CHLORIDE 0.9 % IV SOLN: 250.0000 mL | INTRAVENOUS | Status: DC | PRN

## 2015-10-04 MED ORDER — OXYCODONE-ACETAMINOPHEN 5-325 MG PO TABS: 1.0000 | ORAL_TABLET | ORAL | Status: DC | PRN

## 2015-10-04 MED ORDER — LIDOCAINE HCL (CARDIAC) 20 MG/ML IV SOLN
INTRAVENOUS | Status: AC
  Filled 2015-10-04: qty 10

## 2015-10-04 MED ORDER — CEFAZOLIN SODIUM-DEXTROSE 2-3 GM-% IV SOLR
INTRAVENOUS | Status: AC
  Filled 2015-10-04: qty 50

## 2015-10-04 MED ORDER — PROMETHAZINE HCL 25 MG/ML IJ SOLN
6.2500 mg | INTRAMUSCULAR | Status: DC | PRN
  Administered 2015-10-04: 6.25 mg via INTRAVENOUS

## 2015-10-04 MED ORDER — 0.9 % SODIUM CHLORIDE (POUR BTL) OPTIME
TOPICAL | Status: DC | PRN
  Administered 2015-10-04: 1000 mL

## 2015-10-04 MED ORDER — MIDAZOLAM HCL 2 MG/2ML IJ SOLN
INTRAMUSCULAR | Status: AC
  Filled 2015-10-04: qty 2

## 2015-10-04 MED ORDER — SODIUM CHLORIDE 0.9 % IJ SOLN
INTRAMUSCULAR | Status: AC
  Filled 2015-10-04: qty 10

## 2015-10-04 MED ORDER — MORPHINE SULFATE (PF) 2 MG/ML IV SOLN: 2.0000 mg | INTRAVENOUS | Status: DC | PRN

## 2015-10-04 SURGICAL SUPPLY — 59 items
APL SKNCLS STERI-STRIP NONHPOA (GAUZE/BANDAGES/DRESSINGS) ×1
APPLIER CLIP LOGIC TI 5 (MISCELLANEOUS) IMPLANT
APR CLP MED LRG 33X5 (MISCELLANEOUS)
BENZOIN TINCTURE PRP APPL 2/3 (GAUZE/BANDAGES/DRESSINGS) ×3 IMPLANT
BLADE SURG ROTATE 9660 (MISCELLANEOUS) ×2 IMPLANT
CANISTER SUCTION 2500CC (MISCELLANEOUS) IMPLANT
CHLORAPREP W/TINT 26ML (MISCELLANEOUS) ×3 IMPLANT
CLOSURE WOUND 1/2 X4 (GAUZE/BANDAGES/DRESSINGS) ×1
COVER SURGICAL LIGHT HANDLE (MISCELLANEOUS) ×3 IMPLANT
DECANTER SPIKE VIAL GLASS SM (MISCELLANEOUS) ×2 IMPLANT
DEVICE RELIATACK FIXATION (MISCELLANEOUS) ×3 IMPLANT
DEVICE SECURE STRAP 25 ABSORB (INSTRUMENTS) ×2 IMPLANT
DEVICE TROCAR PUNCTURE CLOSURE (ENDOMECHANICALS) ×3 IMPLANT
DRAPE LAPAROSCOPIC ABDOMINAL (DRAPES) ×3 IMPLANT
ELECT REM PT RETURN 9FT ADLT (ELECTROSURGICAL) ×3
ELECTRODE REM PT RTRN 9FT ADLT (ELECTROSURGICAL) ×1 IMPLANT
GAUZE SPONGE 2X2 8PLY STRL LF (GAUZE/BANDAGES/DRESSINGS) ×1 IMPLANT
GLOVE BIO SURGEON STRL SZ7.5 (GLOVE) ×3 IMPLANT
GLOVE BIO SURGEON STRL SZ8 (GLOVE) ×2 IMPLANT
GLOVE BIOGEL PI IND STRL 7.5 (GLOVE) IMPLANT
GLOVE BIOGEL PI IND STRL 8.5 (GLOVE) IMPLANT
GLOVE BIOGEL PI INDICATOR 7.5 (GLOVE) ×4
GLOVE BIOGEL PI INDICATOR 8.5 (GLOVE) ×2
GLOVE SURG SS PI 7.5 STRL IVOR (GLOVE) ×2 IMPLANT
GOWN STRL REUS W/ TWL LRG LVL3 (GOWN DISPOSABLE) ×2 IMPLANT
GOWN STRL REUS W/ TWL XL LVL3 (GOWN DISPOSABLE) ×1 IMPLANT
GOWN STRL REUS W/TWL LRG LVL3 (GOWN DISPOSABLE) ×6
GOWN STRL REUS W/TWL XL LVL3 (GOWN DISPOSABLE) ×3
KIT BASIN OR (CUSTOM PROCEDURE TRAY) ×3 IMPLANT
KIT ROOM TURNOVER OR (KITS) ×3 IMPLANT
MARKER SKIN DUAL TIP RULER LAB (MISCELLANEOUS) ×3 IMPLANT
MESH VENTRALIGHT ST 4.5IN (Mesh General) ×2 IMPLANT
NDL INSUFFLATION 14GA 120MM (NEEDLE) ×1 IMPLANT
NDL SPNL 22GX3.5 QUINCKE BK (NEEDLE) IMPLANT
NEEDLE INSUFFLATION 14GA 120MM (NEEDLE) ×3 IMPLANT
NEEDLE SPNL 22GX3.5 QUINCKE BK (NEEDLE) IMPLANT
NS IRRIG 1000ML POUR BTL (IV SOLUTION) ×3 IMPLANT
PAD ARMBOARD 7.5X6 YLW CONV (MISCELLANEOUS) ×4 IMPLANT
RELOAD ENDO RELIATCK 10 HERNIA (MISCELLANEOUS) IMPLANT
RELOAD ENDO RELIATCK 5 HERNIA (MISCELLANEOUS) IMPLANT
RELOAD RELIATACK 10 (MISCELLANEOUS) IMPLANT
RELOAD RELIATACK 5 (MISCELLANEOUS) IMPLANT
SCALPEL HARMONIC ACE (MISCELLANEOUS) IMPLANT
SCISSORS LAP 5X35 DISP (ENDOMECHANICALS) ×3 IMPLANT
SET IRRIG TUBING LAPAROSCOPIC (IRRIGATION / IRRIGATOR) IMPLANT
SLEEVE ENDOPATH XCEL 5M (ENDOMECHANICALS) ×6 IMPLANT
SPONGE GAUZE 2X2 STER 10/PKG (GAUZE/BANDAGES/DRESSINGS) ×2
STRIP CLOSURE SKIN 1/2X4 (GAUZE/BANDAGES/DRESSINGS) ×1 IMPLANT
SUT CHROMIC 2 0 SH (SUTURE) ×3 IMPLANT
SUT MNCRL AB 3-0 PS2 18 (SUTURE) ×3 IMPLANT
SUT NOVA NAB DX-16 0-1 5-0 T12 (SUTURE) ×3 IMPLANT
SUT PROLENE 2 0 KS (SUTURE) ×3 IMPLANT
TOWEL OR 17X24 6PK STRL BLUE (TOWEL DISPOSABLE) ×3 IMPLANT
TOWEL OR 17X26 10 PK STRL BLUE (TOWEL DISPOSABLE) ×1 IMPLANT
TRAY FOLEY CATH 16FR SILVER (SET/KITS/TRAYS/PACK) IMPLANT
TRAY LAPAROSCOPIC MC (CUSTOM PROCEDURE TRAY) ×3 IMPLANT
TROCAR XCEL NON-BLD 11X100MML (ENDOMECHANICALS) ×3 IMPLANT
TROCAR XCEL NON-BLD 5MMX100MML (ENDOMECHANICALS) ×3 IMPLANT
TUBING INSUFFLATION (TUBING) ×3 IMPLANT

## 2015-10-04 NOTE — Interval H&P Note (Signed)
History and Physical Interval Note:  10/04/2015 11:06 AM  Jenny Soto  has presented today for surgery, with the diagnosis of Incisional hernia  The various methods of treatment have been discussed with the patient and family. After consideration of risks, benefits and other options for treatment, the patient has consented to  Procedure(s): LAPAROSCOPIC INCISIONAL HERNIA (N/A) WITH INSERTION OF MESH (N/A) as a surgical intervention .  The patient's history has been reviewed, patient examined, no change in status, stable for surgery.  I have reviewed the patient's chart and labs.  Questions were answered to the patient's satisfaction.     Rosario Jacks., Anne Hahn

## 2015-10-04 NOTE — Anesthesia Preprocedure Evaluation (Addendum)
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   breath sounds clear to auscultation       Cardiovascular (-) anginanegative cardio ROS   Rhythm:Regular Rate:Normal     Neuro/Psych negative neurological ROS     GI/Hepatic negative GI ROS, Neg liver ROS,   Endo/Other  negative endocrine ROS  Renal/GU H/o renal cell cancer: nephrectomy     Musculoskeletal   Abdominal   Peds  Hematology negative hematology ROS (+)   Anesthesia Other Findings   Reproductive/Obstetrics                            Anesthesia Physical Anesthesia Plan  ASA: II  Anesthesia Plan: General   Post-op Pain Management:    Induction: Intravenous  Airway Management Planned: Oral ETT  Additional Equipment:   Intra-op Plan:   Post-operative Plan: Extubation in OR  Informed Consent: I have reviewed the patients History and Physical, chart, labs and discussed the procedure including the risks, benefits and alternatives for the proposed anesthesia with the patient or authorized representative who has indicated his/her understanding and acceptance.   Dental advisory given  Plan Discussed with: CRNA and Surgeon  Anesthesia Plan Comments: (Plan routine monitors, GETA)        Anesthesia Quick Evaluation

## 2015-10-04 NOTE — Anesthesia Procedure Notes (Signed)
Procedure Name: Intubation Date/Time: 10/04/2015 12:11 PM Performed by: Scheryl Darter Pre-anesthesia Checklist: Patient identified, Emergency Drugs available, Patient being monitored and Suction available Patient Re-evaluated:Patient Re-evaluated prior to inductionOxygen Delivery Method: Circle system utilized Preoxygenation: Pre-oxygenation with 100% oxygen Intubation Type: IV induction Ventilation: Mask ventilation without difficulty Laryngoscope Size: Mac and 3 Grade View: Grade I Tube type: Oral Tube size: 7.5 mm Number of attempts: 1 Airway Equipment and Method: Stylet Placement Confirmation: ETT inserted through vocal cords under direct vision,  positive ETCO2 and breath sounds checked- equal and bilateral Secured at: 21 cm Tube secured with: Tape Dental Injury: Teeth and Oropharynx as per pre-operative assessment

## 2015-10-04 NOTE — Op Note (Signed)
10/04/2015  12:47 PM  PATIENT:  Jenny Soto  65 y.o. female  PRE-OPERATIVE DIAGNOSIS:  Incisional hernia  POST-OPERATIVE DIAGNOSIS:  Incisional epigastric hernia  PROCEDURE:  Procedure(s): LAPAROSCOPIC INCISIONAL HERNIA (N/A) WITH INSERTION OF MESH (N/A)  SURGEON:  Surgeon(s) and Role:    * Ralene Ok, MD - Primary  ASSISTANTS: none   ANESTHESIA:   local and general  EBL:     BLOOD ADMINISTERED:none  DRAINS: none   LOCAL MEDICATIONS USED:  BUPIVICAINE   SPECIMEN:  No Specimen  DISPOSITION OF SPECIMEN:  N/A  COUNTS:  YES  TOURNIQUET:  * No tourniquets in log *  DICTATION: .Dragon Dictation  Details of the procedure:   After the patient was consented patient was taken back to the operating room patient was then placed in supine position bilateral SCDs in place.  The patient was prepped and draped in the usual sterile fashion. After antibiotics were confirmed a timeout was called and all facts were verified. The Veress needle technique was used to insuflate the abdomen at right subcostal margin. The abdomen was insufflated to 14 mm mercury. Subsequently a 5 mm trocar was placed a camera inserted there was no injury to any intra-abdominal organs.    There was seen to be an incarcerated epigastric incisional  hernia.  A second camera port was in placed into the right lower quadrant.   At this the Falicform ligament was taken down with Bovie cautery maintaining hemostasis.   I proceeded to reduce the hernia contents which consisted of preperitoneal fat.  Once the hernia was cleared away, a Bard Ventralight 11.4cm  mesh was inserted into the abdomen.  The mesh was secured circumferentially with am Securestrap tacker in a double crown fashion.    The omentum was brought over the area of the mesh. The pneumoperitoneum was evacuated  & all trocars  were removed. The skin was reapproximated with 4-0  Monocryl sutures in a subcuticular fashion. The skin was dressed with  Steri-Strips tape and gauze.  The patient was taken to the recovery room in stable condition.   PLAN OF CARE: Discharge to home after PACU  PATIENT DISPOSITION:  PACU - hemodynamically stable.   Delay start of Pharmacological VTE agent (>24hrs) due to surgical blood loss or risk of bleeding: not applicable

## 2015-10-04 NOTE — Anesthesia Postprocedure Evaluation (Signed)
Anesthesia Post Note  Patient: Jenny Soto  Procedure(s) Performed: Procedure(s) (LRB): LAPAROSCOPIC INCISIONAL HERNIA (N/A) WITH INSERTION OF MESH (N/A)  Patient location during evaluation: PACU Anesthesia Type: General Level of consciousness: awake and alert Pain management: pain level controlled Vital Signs Assessment: post-procedure vital signs reviewed and stable Respiratory status: spontaneous breathing, nonlabored ventilation, respiratory function stable and patient connected to nasal cannula oxygen Cardiovascular status: blood pressure returned to baseline and stable Postop Assessment: no signs of nausea or vomiting Anesthetic complications: no    Last Vitals:  Filed Vitals:   10/04/15 1404 10/04/15 1504  BP: 125/65 107/51  Pulse: 73 66  Temp:    Resp: 14 10    Last Pain:  Filed Vitals:   10/04/15 1506  PainSc: Asleep                 Effie Berkshire

## 2015-10-04 NOTE — Transfer of Care (Signed)
Immediate Anesthesia Transfer of Care Note  Patient: Jenny Soto  Procedure(s) Performed: Procedure(s): LAPAROSCOPIC INCISIONAL HERNIA (N/A) WITH INSERTION OF MESH (N/A)  Patient Location: PACU  Anesthesia Type:General  Level of Consciousness: awake, alert , oriented and sedated  Airway & Oxygen Therapy: Patient Spontanous Breathing and Patient connected to nasal cannula oxygen  Post-op Assessment: Report given to RN, Post -op Vital signs reviewed and stable and Patient moving all extremities  Post vital signs: Reviewed and stable  Last Vitals:  Filed Vitals:   10/04/15 1021 10/04/15 1303  BP: 138/65   Pulse: 78   Temp: 36.9 C 36.3 C  Resp: 18     Complications: No apparent anesthesia complications

## 2015-10-04 NOTE — Discharge Instructions (Signed)
[facility] _______[facility] ° °Incisional HERNIA REPAIR: POST OP INSTRUCTIONS ° °Always review your discharge instruction sheet given to you by the facility where your surgery was performed. °IF YOU HAVE DISABILITY OR FAMILY LEAVE FORMS, YOU MUST BRING THEM TO THE OFFICE FOR PROCESSING.   °DO NOT GIVE THEM TO YOUR DOCTOR. ° °1. A  prescription for pain medication may be given to you upon discharge.  Take your pain medication as prescribed, if needed.  If narcotic pain medicine is not needed, then you may take acetaminophen (Tylenol) or ibuprofen (Advil) as needed. °2. Take your usually prescribed medications unless otherwise directed. °3. If you need a refill on your pain medication, please contact your pharmacy.  They will contact our office to request authorization. Prescriptions will not be filled after 5 pm or on week-ends. °4. You should follow a light diet the first 24 hours after arrival home, such as soup and crackers, etc.  Be sure to include lots of fluids daily.  Resume your normal diet the day after surgery. °5. Most patients will experience some swelling and bruising around the umbilicus or in the groin and scrotum.  Ice packs and reclining will help.  Swelling and bruising can take several days to resolve.  °6. It is common to experience some constipation if taking pain medication after surgery.  Increasing fluid intake and taking a stool softener (such as Colace) will usually help or prevent this problem from occurring.  A mild laxative (Milk of Magnesia or Miralax) should be taken according to package directions if there are no bowel movements after 48 hours. °7. Unless discharge instructions indicate otherwise, you may remove your bandages 24-48 hours after surgery, and you may shower at that time.  You may have steri-strips (small skin tapes) in place directly over the incision.  These strips should be left on the skin for 7-10 days.  If your surgeon used skin glue on the incision, you  may shower in 24 hours.  The glue will flake off over the next 2-3 weeks.  Any sutures or staples will be removed at the office during your follow-up visit. °8. ACTIVITIES:  You may resume regular (light) daily activities beginning the next day--such as daily self-care, walking, climbing stairs--gradually increasing activities as tolerated.  You may have sexual intercourse when it is comfortable.  Refrain from any heavy lifting or straining until approved by your doctor. °a. You may drive when you are no longer taking prescription pain medication, you can comfortably wear a seatbelt, and you can safely maneuver your car and apply brakes. °b. RETURN TO WORK:  __________________________________________________________ °9. You should see your doctor in the office for a follow-up appointment approximately 2-3 weeks after your surgery.  Make sure that you call for this appointment within a day or two after you arrive home to insure a convenient appointment time. °10. OTHER INSTRUCTIONS:  __________________________________________________________________________________________________________________________________________________________________________________________  °WHEN TO CALL YOUR DOCTOR: °1. Fever over 101.0 °2. Inability to urinate °3. Nausea and/or vomiting °4. Extreme swelling or bruising °5. Continued bleeding from incision. °6. Increased pain, redness, or drainage from the incision ° °The clinic staff is available to answer your questions during regular business hours.  Please don’t hesitate to call and ask to speak to one of the nurses for clinical concerns.  If you have a medical emergency, go to the nearest emergency room or call 911.  A surgeon from [facility] is always on call at the hospital ° ° °[address], [address], [place], [place]  [address] ? ° [address] [address], [place], [place]   [address] °[phone] ? [phone] ? FAX [phone] °Web site:  [email or web address] ° °

## 2015-10-04 NOTE — H&P (View-Only) (Signed)
History of Present Illness Jenny Soto; 09/19/2015 3:51 PM) The patient is a 65 year old female who presents with an incisional hernia. 65 year old female who is referred by Dr. Zola Button for evaluation of a incisional hernia. Patient had a left nephrectomy secondary to renal cell carcinoma years ago. Patient states that in November of this past year she was moving some boxes and felt a small 8 to area. Subsequent to that she noticed a bulge in the area. Patient underwent a CT scan which revealed a ventral incisional hernia. This repaired the medial aspect of her previous nephrectomy incision.  Patient is active, does some light work out, and yoga.   Other Problems Elbert Ewings, CMA; 09/19/2015 3:36 PM) Arthritis Back Pain Bladder Problems Cancer General anesthesia - complications Hemorrhoids Hypercholesterolemia Lung Cancer Ventral Hernia Repair  Past Surgical History Elbert Ewings, CMA; 09/19/2015 3:36 PM) Gallbladder Surgery - Laparoscopic Lung Surgery Right. Nephrectomy Left. Oral Surgery Splenectomy  Diagnostic Studies History Elbert Ewings, [place]; 09/19/2015 3:36 PM) Colonoscopy 1-5 years ago Mammogram 1-3 years ago Pap Smear 1-5 years ago  Allergies Elbert Ewings, CMA; 09/19/2015 3:36 PM) No Known Drug Allergies01/24/2017  Medication History Elbert Ewings, CMA; 09/19/2015 3:36 PM) No Current Medications Medications Reconciled  Social History Elbert Ewings, CMA; 09/19/2015 3:36 PM) Alcohol use Moderate alcohol use. Caffeine use Coffee. No drug use Tobacco use Former smoker.  Family History Elbert Ewings, [place]; 09/19/2015 3:36 PM) Family history unknown First Degree Relatives  Pregnancy / Birth History Elbert Ewings, [place]; 09/19/2015 3:36 PM) Age at menarche 62 years. Age of menopause [age] Contraceptive History Oral contraceptives. Gravida 3 Irregular periods Maternal age 58-25 Para 2    Review of Systems Elbert Ewings CMA;  09/19/2015 3:36 PM) General Not Present- Appetite Loss, Chills, Fatigue, Fever, Night Sweats, Weight Gain and Weight Loss. Skin Not Present- Change in Wart/Mole, Dryness, Hives, Jaundice, New Lesions, Non-Healing Wounds, Rash and Ulcer. HEENT Not Present- Earache, Hearing Loss, Hoarseness, Nose Bleed, Oral Ulcers, Ringing in the Ears, Seasonal Allergies, Sinus Pain, Sore Throat, Visual Disturbances, Wears glasses/contact lenses and Yellow Eyes. Respiratory Not Present- Bloody sputum, Chronic Cough, Difficulty Breathing, Snoring and Wheezing. Breast Not Present- Breast Mass, Breast Pain, Nipple Discharge and Skin Changes. Cardiovascular Not Present- Chest Pain, Difficulty Breathing Lying Down, Leg Cramps, Palpitations, Rapid Heart Rate, Shortness of Breath and Swelling of Extremities. Gastrointestinal Present- Abdominal Pain and Hemorrhoids. Not Present- Bloating, Bloody Stool, Change in Bowel Habits, Chronic diarrhea, Constipation, Difficulty Swallowing, Excessive gas, Gets full quickly at meals, Indigestion, Nausea, Rectal Pain and Vomiting. Female Genitourinary Not Present- Frequency, Nocturia, Painful Urination, Pelvic Pain and Urgency. Musculoskeletal Not Present- Back Pain, Joint Pain, Joint Stiffness, Muscle Pain, Muscle Weakness and Swelling of Extremities. Neurological Not Present- Decreased Memory, Fainting, Headaches, Numbness, Seizures, Tingling, Tremor, Trouble walking and Weakness. Psychiatric Not Present- Anxiety, Bipolar, Change in Sleep Pattern, Depression, Fearful and Frequent crying. Endocrine Not Present- Cold Intolerance, Excessive Hunger, Hair Changes, Heat Intolerance, Hot flashes and New Diabetes. Hematology Not Present- Easy Bruising, Excessive bleeding, Gland problems, HIV and Persistent Infections.  Vitals Elbert Ewings CMA; 09/19/2015 3:36 PM) 09/19/2015 3:36 PM Weight: 147 lb Height: 65in Body Surface Area: 1.74 m Body Mass Index: 24.46 kg/m  Temp.:  97.23F(Temporal)  Pulse: 59 (Regular)  BP: 126/63 (Sitting, Left Arm, Standard)       Physical Exam Jenny Soto; 09/19/2015 3:51 PM) General Mental Status-Alert. General Appearance-Consistent with stated age. Hydration-Well hydrated. Voice-Normal.  Head and Neck Head-normocephalic, atraumatic with no lesions or palpable  masses. Trachea-midline. Thyroid Gland Characteristics - normal size and consistency.  Chest and Lung Exam Chest and lung exam reveals -quiet, even and easy respiratory effort with no use of accessory muscles and on auscultation, normal breath sounds, no adventitious sounds and normal vocal resonance. Inspection Chest Wall - Normal. Back - normal.  Cardiovascular Cardiovascular examination reveals -normal heart sounds, regular rate and rhythm with no murmurs and normal pedal pulses bilaterally.  Abdomen Inspection Hernias - Incisional - Reducible(Approximately 2 cm Large left nephrectomy incision site). Palpation/Percussion Normal exam - Soft, Non Tender, No Rebound tenderness, No Rigidity (guarding) and No hepatosplenomegaly. Auscultation Normal exam - Bowel sounds normal.    Assessment & Plan Jenny Soto; 09/19/2015 3:52 PM) INCISIONAL HERNIA, WITHOUT OBSTRUCTION OR GANGRENE (K43.2) Impression: 65 year old female with a epigastric incisional hernia.  1. 1. The patient will like to proceed to the operating room for laparoscopic incisional hernia repair.  2. I discussed with the patient the signs and symptoms of incarceration and strangulation and the need to proceed to the ER should they occur.  3. I discussed with the patient the risks and benefits of the procedure to include but not limited to: Infection, bleeding, damage to surrounding structures, possible need for further surgery, possible nerve pain, and possible recurrence. The patient was understanding and wishes to proceed.

## 2015-10-05 ENCOUNTER — Encounter (HOSPITAL_COMMUNITY): Payer: Self-pay | Admitting: General Surgery

## 2015-11-09 ENCOUNTER — Ambulatory Visit: Payer: BC Managed Care – PPO | Admitting: Oncology

## 2015-11-09 ENCOUNTER — Other Ambulatory Visit: Payer: BC Managed Care – PPO

## 2016-07-09 ENCOUNTER — Encounter: Payer: Self-pay | Admitting: Oncology

## 2016-07-10 ENCOUNTER — Telehealth: Payer: Self-pay | Admitting: Oncology

## 2016-07-10 NOTE — Telephone Encounter (Signed)
Patient called to have appointments scheduled to December per she will be out of town until December.

## 2016-07-16 ENCOUNTER — Other Ambulatory Visit: Payer: BC Managed Care – PPO

## 2016-07-16 ENCOUNTER — Ambulatory Visit: Payer: BC Managed Care – PPO | Admitting: Oncology

## 2016-08-07 ENCOUNTER — Telehealth: Payer: Self-pay | Admitting: Oncology

## 2016-08-07 ENCOUNTER — Other Ambulatory Visit (HOSPITAL_BASED_OUTPATIENT_CLINIC_OR_DEPARTMENT_OTHER): Payer: BC Managed Care – PPO

## 2016-08-07 ENCOUNTER — Ambulatory Visit (HOSPITAL_BASED_OUTPATIENT_CLINIC_OR_DEPARTMENT_OTHER): Payer: BC Managed Care – PPO | Admitting: Oncology

## 2016-08-07 VITALS — BP 131/77 | HR 75 | Temp 98.0°F | Resp 18 | Ht 65.0 in | Wt 151.5 lb

## 2016-08-07 DIAGNOSIS — Z85528 Personal history of other malignant neoplasm of kidney: Secondary | ICD-10-CM

## 2016-08-07 DIAGNOSIS — C649 Malignant neoplasm of unspecified kidney, except renal pelvis: Secondary | ICD-10-CM

## 2016-08-07 LAB — COMPREHENSIVE METABOLIC PANEL
ALBUMIN: 3.9 g/dL (ref 3.5–5.0)
ALK PHOS: 65 U/L (ref 40–150)
ALT: 23 U/L (ref 0–55)
AST: 22 U/L (ref 5–34)
Anion Gap: 9 mEq/L (ref 3–11)
BILIRUBIN TOTAL: 0.53 mg/dL (ref 0.20–1.20)
BUN: 14.5 mg/dL (ref 7.0–26.0)
CO2: 24 mEq/L (ref 22–29)
CREATININE: 1.1 mg/dL (ref 0.6–1.1)
Calcium: 10.2 mg/dL (ref 8.4–10.4)
Chloride: 106 mEq/L (ref 98–109)
EGFR: 56 mL/min/{1.73_m2} — ABNORMAL LOW (ref >90)
GLUCOSE: 105 mg/dL (ref 70–140)
Potassium: 4.4 mEq/L (ref 3.5–5.1)
SODIUM: 139 meq/L (ref 136–145)
TOTAL PROTEIN: 7.6 g/dL (ref 6.4–8.3)

## 2016-08-07 LAB — CBC WITH DIFFERENTIAL/PLATELET
BASO%: 1.3 % (ref 0.0–2.0)
Basophils Absolute: 0.1 10*3/uL (ref 0.0–0.1)
EOS ABS: 0.3 10*3/uL (ref 0.0–0.5)
EOS%: 4.7 % (ref 0.0–7.0)
HCT: 41.9 % (ref 34.8–46.6)
HEMOGLOBIN: 13.5 g/dL (ref 11.6–15.9)
LYMPH%: 44.2 % (ref 14.0–49.7)
MCH: 32.3 pg (ref 25.1–34.0)
MCHC: 32.3 g/dL (ref 31.5–36.0)
MCV: 100 fL (ref 79.5–101.0)
MONO#: 0.6 10*3/uL (ref 0.1–0.9)
MONO%: 10.1 % (ref 0.0–14.0)
NEUT%: 39.7 % (ref 38.4–76.8)
NEUTROS ABS: 2.3 10*3/uL (ref 1.5–6.5)
Platelets: 286 10*3/uL (ref 145–400)
RBC: 4.19 10*6/uL (ref 3.70–5.45)
RDW: 13.5 % (ref 11.2–14.5)
WBC: 5.9 10*3/uL (ref 3.9–10.3)
lymph#: 2.6 10*3/uL (ref 0.9–3.3)

## 2016-08-07 NOTE — Telephone Encounter (Signed)
Appointments scheduled per 12/13 LOS. Patient given AVS report and Calendars with future scheduled appointments. Patient given two bottles of contrast and instructions for scan appointment.

## 2016-08-07 NOTE — Progress Notes (Signed)
Hematology and Oncology Follow Up Visit  Jenny Soto [id number] 1951-02-06 65 y.o. 08/07/2016 3:37 PM  [name], M.D.  [place] Rosana Hoes, M.D.  [name], MD    Principle Diagnosis: A 65 year old female with advanced renal cell carcinoma initially presented in 2006.  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  Prior Therapy:  1. Status post nephrectomy in 2006.  However, she developed recurrent disease in 2009.  2. The patient underwent surgical resection, VATS procedure and wedge resection of the right lower lobe done in 2009.  Current therapy: Active surveillance.  Interim History: Jenny Soto presents today for a follow-up visit. Since her last visit, she underwent abdominal hernia surgery in February 2017 and recovered well. She has been stopped pending the majority of her time remodeling her house with her husband at the [place]. She continues to be active without any decline. She did not any constitutional symptoms of weight loss or appetite changes. She has not reported any back pain or pathological fractures.   She is not reporting pulmonary symptoms at this time including cough, shortness of breath or hemoptysis. She has not reported any headaches or blurry vision. Does not report any syncope or seizures. She reports no chest pain or leg edema. Does not report any lymphadenopathy or petechiae. She has not reported any frequency, urgency or hesitancy. The rest of symptoms review of systems unremarkable.  Medications: I have reviewed the patient's current medications.  Current Outpatient Prescriptions:  .  ondansetron (ZOFRAN ODT) 4 MG disintegrating tablet, Take 1 tablet (4 mg total) by mouth every 8 (eight) hours as needed for nausea or vomiting., Disp: 20 tablet, Rfl: 0 .  oxyCODONE-acetaminophen (ROXICET) 5-325 MG tablet, Take 1-2 tablets by mouth every 4 (four) hours as needed., Disp: 30 tablet, Rfl: 0  Allergies: No Known Allergies  Past Medical  History, Surgical history, Social history, and Family History were reviewed and updated.   Blood pressure 131/77, pulse 75, temperature 98 F (36.7 C), temperature source Oral, resp. rate 18, height 5\' 5"  (1.651 m), weight 151 lb 8 oz (68.7 kg), SpO2 100 %. ECOG: 0 General appearance: Well-appearing woman without distress. Head: Normocephalic, without obvious abnormality no oral thrush. Neck: no adenopathy Lymph nodes: Cervical, supraclavicular, and axillary nodes normal. Heart:regular rate and rhythm, S1, S2 normal, no murmur, click, rub or gallop Lung:chest clear, no wheezing, rales, normal symmetric air entry no dullness to percussion. Abdomin: soft, non-tender, without masses or organomegaly . Abdominal scars from her hernia appear well healed. EXT:no erythema, induration, or nodules   Lab Results: Lab Results  Component Value Date   WBC 5.9 08/07/2016   HGB 13.5 08/07/2016   HCT 41.9 08/07/2016   MCV 100.0 08/07/2016   PLT 286 08/07/2016     Chemistry      Component Value Date/Time   NA 141 10/02/2015 1424   NA 139 09/07/2015 1145   K 4.5 10/02/2015 1424   K 4.7 09/07/2015 1145   CL 102 10/02/2015 1424   CL 105 11/11/2012 0956   CO2 25 10/02/2015 1424   CO2 24 09/07/2015 1145   BUN 16 10/02/2015 1424   BUN 16.0 09/07/2015 1145   CREATININE 1.05 (H) 10/02/2015 1424   CREATININE 1.2 (H) 09/07/2015 1145      Component Value Date/Time   CALCIUM 9.8 10/02/2015 1424   CALCIUM 10.0 09/07/2015 1145   ALKPHOS 57 09/07/2015 1145   AST 25 09/07/2015 1145   ALT 23  09/07/2015 1145   BILITOT 0.60 09/07/2015 1145     EXAM: CT CHEST WITH CONTRAST  CT ABDOMEN PELVIS, CT ABDOMEN WITH AND WITHOUT CONTRAST  TECHNIQUE: Multidetector CT imaging of the chest abdomen and pelvis was performed during intravenous contrast administration. Multidetector CT imaging of the abdomen was performed following the standard protocol before administration of intravenous  contrast.  CONTRAST: 158mL OMNIPAQUE IOHEXOL 300 MG/ML SOLN  IMPRESSION: 1. Status post left nephrectomy, without recurrent or metastatic disease. 2. Fat containing ventral abdominal wall hernia is similar. No acute complication. 3. Surgical changes in the right lower lobe. No new pulmonary metastasis identified.      Impression and Plan:  A 65 year old female with the following issues. 1. Renal cell carcinoma initially diagnosed at a T3 disease status post nephrectomy in 2006.  She had a pulmonary metastasis resected in 2009. CT scan from 09/07/2015 showed no cancer recurrence. The plan is to continue with active surveillance and repeat imaging studies in January 2018. She'll have annual visits and scans at that time. 2. Ventral hernia noted on exam as well as on CT scan. She is status post repair in February 2017. 3. Age-appropriate cancer screening: She is up-to-date on her mammography as well. She is overdue for her colonoscopy which I encouraged her to get that done in the near future. 4. Follow-up: Will be in December 2018.  Zola Button, MD 12/13/20173:37 PM

## 2016-08-08 ENCOUNTER — Encounter: Payer: Self-pay | Admitting: Oncology

## 2016-08-17 ENCOUNTER — Encounter: Payer: Self-pay | Admitting: Oncology

## 2016-08-29 ENCOUNTER — Ambulatory Visit: Payer: BC Managed Care – PPO | Admitting: Family

## 2016-09-02 ENCOUNTER — Other Ambulatory Visit: Payer: Self-pay | Admitting: *Deleted

## 2016-09-02 DIAGNOSIS — Z85528 Personal history of other malignant neoplasm of kidney: Secondary | ICD-10-CM

## 2016-09-03 ENCOUNTER — Telehealth: Payer: Self-pay | Admitting: *Deleted

## 2016-09-03 ENCOUNTER — Ambulatory Visit (HOSPITAL_COMMUNITY)
Admission: RE | Admit: 2016-09-03 | Discharge: 2016-09-03 | Disposition: A | Discharge status: Home / Self Care | Payer: BC Managed Care – PPO | Source: Ambulatory Visit | Attending: Oncology | Admitting: Oncology

## 2016-09-03 DIAGNOSIS — Z85528 Personal history of other malignant neoplasm of kidney: Secondary | ICD-10-CM | POA: Diagnosis present

## 2016-09-03 DIAGNOSIS — C649 Malignant neoplasm of unspecified kidney, except renal pelvis: Secondary | ICD-10-CM | POA: Insufficient documentation

## 2016-09-03 DIAGNOSIS — Z905 Acquired absence of kidney: Secondary | ICD-10-CM | POA: Insufficient documentation

## 2016-09-03 DIAGNOSIS — Z9889 Other specified postprocedural states: Secondary | ICD-10-CM | POA: Diagnosis not present

## 2016-09-03 MED ORDER — IOPAMIDOL (ISOVUE-300) INJECTION 61%
INTRAVENOUS | Status: AC
  Filled 2016-09-03: qty 100

## 2016-09-03 MED ORDER — IOPAMIDOL (ISOVUE-300) INJECTION 61%
100.0000 mL | Freq: Once | INTRAVENOUS | Status: AC | PRN
  Administered 2016-09-03: 100 mL via INTRAVENOUS

## 2016-09-03 NOTE — Telephone Encounter (Signed)
As noted below by Dr. Alen Blew, I informed patient that her scan was good. Patient verbalized understanding.

## 2016-09-03 NOTE — Telephone Encounter (Signed)
-----   Message from Wyatt Portela, MD sent at 09/03/2016 11:06 AM EST ----- Please let her know her scan is good.

## 2016-09-05 ENCOUNTER — Ambulatory Visit: Payer: BC Managed Care – PPO | Admitting: Oncology

## 2016-09-12 ENCOUNTER — Ambulatory Visit: Payer: BC Managed Care – PPO | Admitting: Family

## 2016-10-02 ENCOUNTER — Ambulatory Visit (INDEPENDENT_AMBULATORY_CARE_PROVIDER_SITE_OTHER): Payer: BC Managed Care – PPO | Admitting: Adult Health

## 2016-10-02 ENCOUNTER — Encounter: Payer: Self-pay | Admitting: Adult Health

## 2016-10-02 VITALS — BP 122/78 | HR 73 | Ht 63.75 in | Wt 151.0 lb

## 2016-10-02 DIAGNOSIS — E785 Hyperlipidemia, unspecified: Secondary | ICD-10-CM

## 2016-10-02 DIAGNOSIS — Z23 Encounter for immunization: Secondary | ICD-10-CM | POA: Diagnosis not present

## 2016-10-02 DIAGNOSIS — Z1159 Encounter for screening for other viral diseases: Secondary | ICD-10-CM

## 2016-10-02 DIAGNOSIS — R5383 Other fatigue: Secondary | ICD-10-CM | POA: Insufficient documentation

## 2016-10-02 NOTE — Assessment & Plan Note (Signed)
Continue excellent water intake, regular exercise, and healthy eating.

## 2016-10-02 NOTE — Patient Instructions (Signed)
Cholesterol Cholesterol is a fat. Your body needs a small amount of cholesterol. Cholesterol (plaque) may build up in your blood vessels (arteries). That makes you more likely to have a heart attack or stroke. You cannot feel your cholesterol level. Having a blood test is the only way to find out if your level is high. Keep your test results. Work with your doctor to keep your cholesterol at a good level. What do the results mean?  Total cholesterol is how much cholesterol is in your blood.  LDL is bad cholesterol. This is the type that can build up. Try to have low LDL.  HDL is good cholesterol. It cleans your blood vessels and carries LDL away. Try to have high HDL.  Triglycerides are fat that the body can store or burn for energy. What are good levels of cholesterol?  Total cholesterol below 200.  LDL below 100 is good for people who have health risks. LDL below 70 is good for people who have very high risks.  HDL above 40 is good. It is best to have HDL of 60 or higher.  Triglycerides below 150. How can I lower my cholesterol? Diet  Follow your diet program as told by your doctor.  Choose fish, white meat chicken, or [place] that is roasted or baked. Try not to eat red meat, fried foods, sausage, or lunch meats.  Eat lots of fresh fruits and vegetables.  Choose whole grains, beans, pasta, potatoes, and cereals.  Choose olive oil, corn oil, or canola oil. Only use small amounts.  Try not to eat butter, mayonnaise, shortening, or palm kernel oils.  Try not to eat foods with trans fats.  Choose low-fat or nonfat dairy foods.  Drink skim or nonfat milk.  Eat low-fat or nonfat yogurt and cheeses.  Try not to drink whole milk or cream.  Try not to eat ice cream, egg yolks, or full-fat cheeses.  Healthy desserts include angel food cake, ginger snaps, animal crackers, hard candy, popsicles, and low-fat or nonfat frozen yogurt. Try not to eat pastries, cakes, pies, and  cookies. Exercise  Follow your exercise program as told by your doctor.  Be more active. Try gardening, walking, and taking the stairs.  Ask your doctor about ways that you can be more active. Medicine  Take over-the-counter and prescription medicines only as told by your doctor. This information is not intended to replace advice given to you by your health care provider. Make sure you discuss any questions you have with your health care provider. Document Released: 11/08/2008 Document Revised: 03/13/2016 Document Reviewed: 02/22/2016 Elsevier Interactive Patient Education  2017 [place] Introduction Heart-healthy meal planning includes:  Limiting unhealthy fats.  Increasing healthy fats.  Making other small dietary changes. You may need to talk with your doctor or a diet specialist (dietitian) to create an eating plan that is right for you. What types of fat should I choose?  Choose healthy fats. These include olive oil and canola oil, flaxseeds, walnuts, almonds, and seeds.  Eat more omega-3 fats. These include salmon, mackerel, sardines, tuna, flaxseed oil, and ground flaxseeds. Try to eat fish at least twice each week.  Limit saturated fats.  Saturated fats are often found in animal products, such as meats, butter, and cream.  Plant sources of saturated fats include palm oil, palm kernel oil, and coconut oil.  Avoid foods with partially hydrogenated oils in them. These include stick margarine, some tub margarines, cookies, crackers, and other baked  goods. These contain trans fats. What general guidelines do I need to follow?  Check food labels carefully. Identify foods with trans fats or high amounts of saturated fat.  Fill one half of your plate with vegetables and green salads. Eat 4-5 servings of vegetables per day. A serving of vegetables is:  1 cup of raw leafy vegetables.   cup of raw or cooked cut-up vegetables.   cup of  vegetable juice.  Fill one fourth of your plate with whole grains. Look for the word "whole" as the first word in the ingredient list.  Fill one fourth of your plate with lean protein foods.  Eat 4-5 servings of fruit per day. A serving of fruit is:  One medium whole fruit.   cup of dried fruit.   cup of fresh, frozen, or canned fruit.   cup of 100% fruit juice.  Eat more foods that contain soluble fiber. These include apples, broccoli, carrots, beans, peas, and barley. Try to get 20-30 g of fiber per day.  Eat more home-cooked food. Eat less restaurant, buffet, and fast food.  Limit or avoid alcohol.  Limit foods high in starch and sugar.  Avoid fried foods.  Avoid frying your food. Try baking, boiling, grilling, or broiling it instead. You can also reduce fat by:  Removing the skin from poultry.  Removing all visible fats from meats.  Skimming the fat off of stews, soups, and gravies before serving them.  Steaming vegetables in water or broth.  Lose weight if you are overweight.  Eat 4-5 servings of nuts, legumes, and seeds per week:  One serving of dried beans or legumes equals  cup after being cooked.  One serving of nuts equals 1 ounces.  One serving of seeds equals  ounce or one tablespoon.  You may need to keep track of how much salt or sodium you eat. This is especially true if you have high blood pressure. Talk with your doctor or dietitian to get more information. What foods can I eat? Grains  Breads, including [place], white, pita, wheat, raisin, rye, oatmeal, and [place]. Tortillas that are neither fried nor made with lard or trans fat. Low-fat rolls, including hotdog and hamburger buns and English muffins. Biscuits. Muffins. Waffles. Pancakes. Light popcorn. Whole-grain cereals. Flatbread. Melba toast. Pretzels. Breadsticks. Rusks. Low-fat snacks. Low-fat crackers, including oyster, saltine, matzo, graham, animal, and rye. Rice and pasta, including  brown rice and pastas that are made with whole wheat. Vegetables  All vegetables. Fruits  All fruits, but limit coconut. Meats and Other Protein Sources  Lean, well-trimmed beef, veal, pork, and lamb. Chicken and [place] without skin. All fish and shellfish. Wild duck, rabbit, pheasant, and venison. Egg whites or low-cholesterol egg substitutes. Dried beans, peas, lentils, and tofu. Seeds and most nuts. Dairy  Low-fat or nonfat cheeses, including ricotta, string, and mozzarella. Skim or 1% milk that is liquid, powdered, or evaporated. Buttermilk that is made with low-fat milk. Nonfat or low-fat yogurt. Beverages  Mineral water. Diet carbonated beverages. Sweets and Desserts  Sherbets and fruit ices. Honey, jam, marmalade, jelly, and syrups. Meringues and gelatins. Pure sugar candy, such as hard candy, jelly beans, gumdrops, mints, marshmallows, and small amounts of dark chocolate. [employer] Eat all sweets and desserts in moderation. Fats and Oils  Nonhydrogenated (trans-free) margarines. Vegetable oils, including soybean, sesame, sunflower, olive, peanut, safflower, corn, canola, and cottonseed. Salad dressings or mayonnaise made with a vegetable oil. Limit added fats and oils that you use  for cooking, baking, salads, and as spreads. Other  Cocoa powder. Coffee and tea. All seasonings and condiments. The items listed above may not be a complete list of recommended foods or beverages. Contact your dietitian for more options.  What foods are not recommended? Grains  Breads that are made with saturated or trans fats, oils, or whole milk. Croissants. Butter rolls. Cheese breads. Sweet rolls. Donuts. Buttered popcorn. Chow mein noodles. High-fat crackers, such as cheese or butter crackers. Meats and Other Protein Sources  Fatty meats, such as hotdogs, short ribs, sausage, spareribs, bacon, rib eye roast or steak, and mutton. High-fat deli meats, such as salami and bologna. Caviar. Domestic  duck and goose. Organ meats, such as kidney, liver, sweetbreads, and heart. Dairy  Cream, sour cream, cream cheese, and creamed cottage cheese. Whole-milk cheeses, including blue (bleu), Monterey Jack, [place], [place], American, [place], Swiss, cheddar, [place], and [place]. Whole or 2% milk that is liquid, evaporated, or condensed. Whole buttermilk. Cream sauce or high-fat cheese sauce. Yogurt that is made from whole milk. Beverages  Regular sodas and juice drinks with added sugar. Sweets and Desserts  Frosting. Pudding. Cookies. Cakes other than angel food cake. Candy that has milk chocolate or white chocolate, hydrogenated fat, butter, coconut, or unknown ingredients. Buttered syrups. Full-fat ice cream or ice cream drinks. Fats and Oils  Gravy that has suet, meat fat, or shortening. Cocoa butter, hydrogenated oils, palm oil, coconut oil, palm kernel oil. These can often be found in baked products, candy, fried foods, nondairy creamers, and whipped toppings. Solid fats and shortenings, including bacon fat, salt pork, lard, and butter. Nondairy cream substitutes, such as coffee creamers and sour cream substitutes. Salad dressings that are made of unknown oils, cheese, or sour cream. The items listed above may not be a complete list of foods and beverages to avoid. Contact your dietitian for more information.  This information is not intended to replace advice given to you by your health care provider. Make sure you discuss any questions you have with your health care provider. Document Released: 02/11/2012 Document Revised: 01/18/2016 Document Reviewed: 02/03/2014  2017 Elsevier  Will call with lab results. Follow-up in 6 months, or sooner if needed.

## 2016-10-02 NOTE — Progress Notes (Signed)
Subjective:    Patient ID: Jenny Soto, female    DOB: 1950/12/08, 66 y.o.   MRN: [id number]  HPI: Jenny Soto presents to establish as a new pt.  Has a hx of kidney CA that was treated with left nephrectomy in 2006.  She is followed by Oncologist-annual CT scans and lab every 6 months, last encounter was 08/2016.  She denies any acute sx's and feel "very well generally, except I have put on a few lbs due to unusually cold winter".  She has hx of hyperlipidemia that she has treated with lifestyle modifications.    Patient Care Team    Relationship Specialty Notifications Start End  Odella Aquas, NP Nurse Practitioner Family Medicine  10/02/16   Wyatt Portela, MD Consulting Physician Oncology  10/02/16   Janyth Contes, MD Consulting Physician Obstetrics and Gynecology  10/02/16     Patient Active Problem List   Diagnosis Date Noted  . Elevated lipids 10/02/2016  . Fatigue 10/02/2016  . Renal cell cancer ([place]) 05/27/2011  . Symptomatic cholelithiasis 05/27/2011     Past Medical History:  Diagnosis Date  . Cancer ([place])    kidney, lung  . Gallstones   . History of hiatal hernia   . Hyperlipidemia   . PONV (postoperative nausea and vomiting)      Past Surgical History:  Procedure Laterality Date  . BLADDER SUSPENSION  july 2011 and nov 2011  . [place]  . INCISIONAL HERNIA REPAIR N/A 10/04/2015   Procedure: LAPAROSCOPIC INCISIONAL HERNIA;  Surgeon: Ralene Ok, MD;  Location: [facility];  Service: General;  Laterality: N/A;  . INSERTION OF MESH N/A 10/04/2015   Procedure: WITH INSERTION OF MESH;  Surgeon: Ralene Ok, MD;  Location: [facility];  Service: General;  Laterality: N/A;  . LAPAROSCOPIC CHOLECYSTECTOMY  05/29/11  . LOBECTOMY  2009   right lower lung  . NEPHRECTOMY  october 2006   left kidney  . [place]  2005  . POLYPECTOMY  1985   vocal cord  . RHINOPLASTY  1985  . SPLENECTOMY, TOTAL  october 2006  . TUBAL LIGATION  1983  .  TUMOR REMOVAL  1992   ovarian durmoid tumors bilateral     Family History  Problem Relation Age of Onset  . Adopted: Yes     History  Drug Use No     History  Alcohol Use  . 3.6 oz/week  . 6 Shots of liquor per week     History  Smoking Status  . Former Smoker  . Packs/day: 0.50  . Years: 20.00  . Types: Cigarettes  . Quit date: 08/26/2004  Smokeless Tobacco  . Never Used     Outpatient Encounter Prescriptions as of 10/02/2016  Medication Sig  . [DISCONTINUED] ondansetron (ZOFRAN ODT) 4 MG disintegrating tablet Take 1 tablet (4 mg total) by mouth every 8 (eight) hours as needed for nausea or vomiting.  . [DISCONTINUED] oxyCODONE-acetaminophen (ROXICET) 5-325 MG tablet Take 1-2 tablets by mouth every 4 (four) hours as needed.   No facility-administered encounter medications on file as of 10/02/2016.     Allergies: Patient has no known allergies.  Body mass index is 26.12 kg/m.  Blood pressure 122/78, pulse 73, height 5' 3.75" (1.619 m), weight 151 lb (68.5 kg).     Review of Systems  Constitutional: Positive for fatigue. Negative for activity change, appetite change, chills, diaphoresis and unexpected weight change.  HENT: Negative for congestion.   Eyes:  Negative for visual disturbance.  Respiratory: Negative for cough and shortness of breath.   Cardiovascular: Negative for chest pain, palpitations and leg swelling.  Gastrointestinal: Negative for abdominal distention, constipation and diarrhea.  Endocrine: Negative for cold intolerance, heat intolerance, polydipsia, polyphagia and polyuria.  Genitourinary: Negative for difficulty urinating, dysuria, flank pain and frequency.  Musculoskeletal: Negative for arthralgias and back pain.  Skin: Negative for color change, pallor, rash and wound.  Neurological: Negative for dizziness, tremors and weakness.  Psychiatric/Behavioral: Negative for agitation, behavioral problems, confusion and sleep disturbance.   :     Objective:   Physical Exam  Constitutional: She is oriented to person, place, and time. She appears well-developed and well-nourished. No distress.  HENT:  Head: Normocephalic and atraumatic.  Eyes: Conjunctivae and EOM are normal. Pupils are equal, round, and reactive to light.  Neck: Normal range of motion. Neck supple. No tracheal deviation present. No thyromegaly present.  Cardiovascular: Normal rate, regular rhythm and intact distal pulses.   No murmur heard. Pulmonary/Chest: Effort normal and breath sounds normal. No respiratory distress. She has no wheezes. She has no rales.  Abdominal: Soft. Bowel sounds are normal. She exhibits no distension and no mass. There is no tenderness.  Musculoskeletal: Normal range of motion.  Lymphadenopathy:    She has no cervical adenopathy.  Neurological: She is alert and oriented to person, place, and time. She has normal reflexes.  Skin: Skin is warm and dry. No rash noted. She is not diaphoretic. No erythema.  Psychiatric: She has a normal mood and affect. Her behavior is normal. Judgment and thought content normal.  Nursing note and vitals reviewed.         Assessment & Plan:   1. Elevated lipids   2. Fatigue, unspecified type     Elevated lipids Fasting labs obtained today, will call with results. Continue to avoid saturated fats and walking daily.  Fatigue Continue excellent water intake, regular exercise, and healthy eating.    FOLLOW-UP:  Return in about 6 months (around 04/01/2017) for Regular Follow Up.

## 2016-10-02 NOTE — Assessment & Plan Note (Signed)
Fasting labs obtained today, will call with results. Continue to avoid saturated fats and walking daily.

## 2016-10-03 ENCOUNTER — Other Ambulatory Visit: Payer: Self-pay | Admitting: Adult Health

## 2016-10-03 DIAGNOSIS — E789 Disorder of lipoprotein metabolism, unspecified: Secondary | ICD-10-CM

## 2016-10-03 LAB — LIPID PANEL
CHOLESTEROL TOTAL: 282 mg/dL — AB (ref 100–199)
Chol/HDL Ratio: 4.1 ratio units (ref 0.0–4.4)
HDL: 68 mg/dL (ref >39)
LDL Calculated: 182 mg/dL — ABNORMAL HIGH (ref 0–99)
TRIGLYCERIDES: 162 mg/dL — AB (ref 0–149)
VLDL CHOLESTEROL CAL: 32 mg/dL (ref 5–40)

## 2016-10-03 LAB — CBC WITH DIFFERENTIAL/PLATELET
Basophils Absolute: 0.1 10*3/uL (ref 0.0–0.2)
Basos: 1 %
EOS (ABSOLUTE): 0.4 10*3/uL (ref 0.0–0.4)
EOS: 7 %
Hematocrit: 41.1 % (ref 34.0–46.6)
Hemoglobin: 13.7 g/dL (ref 11.1–15.9)
IMMATURE GRANULOCYTES: 0 %
Immature Grans (Abs): 0 10*3/uL (ref 0.0–0.1)
LYMPHS ABS: 2.4 10*3/uL (ref 0.7–3.1)
Lymphs: 45 %
MCH: 32.8 pg (ref 26.6–33.0)
MCHC: 33.3 g/dL (ref 31.5–35.7)
MCV: 98 fL — AB (ref 79–97)
Monocytes Absolute: 0.6 10*3/uL (ref 0.1–0.9)
Monocytes: 10 %
Neutrophils Absolute: 2 10*3/uL (ref 1.4–7.0)
Neutrophils: 37 %
PLATELETS: 302 10*3/uL (ref 150–379)
RBC: 4.18 x10E6/uL (ref 3.77–5.28)
RDW: 14.1 % (ref 12.3–15.4)
WBC: 5.5 10*3/uL (ref 3.4–10.8)

## 2016-10-03 LAB — COMPREHENSIVE METABOLIC PANEL
A/G RATIO: 1.8 (ref 1.2–2.2)
ALK PHOS: 62 IU/L (ref 39–117)
ALT: 19 IU/L (ref 0–32)
AST: 21 IU/L (ref 0–40)
Albumin: 4.4 g/dL (ref 3.6–4.8)
BILIRUBIN TOTAL: 0.7 mg/dL (ref 0.0–1.2)
BUN/Creatinine Ratio: 10 — ABNORMAL LOW (ref 12–28)
BUN: 10 mg/dL (ref 8–27)
CHLORIDE: 105 mmol/L (ref 96–106)
CO2: 24 mmol/L (ref 18–29)
Calcium: 9.9 mg/dL (ref 8.7–10.3)
Creatinine, Ser: 1.03 mg/dL — ABNORMAL HIGH (ref 0.57–1.00)
GFR calc Af Amer: 67 mL/min/{1.73_m2} (ref >59)
GFR calc non Af Amer: 58 mL/min/{1.73_m2} — ABNORMAL LOW (ref >59)
GLUCOSE: 109 mg/dL — AB (ref 65–99)
Globulin, Total: 2.5 g/dL (ref 1.5–4.5)
POTASSIUM: 4.5 mmol/L (ref 3.5–5.2)
Sodium: 143 mmol/L (ref 134–144)
Total Protein: 6.9 g/dL (ref 6.0–8.5)

## 2016-10-03 LAB — VITAMIN B12: Vitamin B-12: 260 pg/mL (ref 232–1245)

## 2016-10-03 LAB — HEMOGLOBIN A1C
ESTIMATED AVERAGE GLUCOSE: 111 mg/dL
Hgb A1c MFr Bld: 5.5 % (ref 4.8–5.6)

## 2016-10-03 LAB — TSH: TSH: 2.23 u[IU]/mL (ref 0.450–4.500)

## 2016-10-03 LAB — VITAMIN D 25 HYDROXY (VIT D DEFICIENCY, FRACTURES): VIT D 25 HYDROXY: 24.4 ng/mL — AB (ref 30.0–100.0)

## 2016-10-03 LAB — HEPATITIS C ANTIBODY: Hep C Virus Ab: 0.1 s/co ratio (ref 0.0–0.9)

## 2016-10-15 ENCOUNTER — Encounter: Payer: Self-pay | Admitting: Internal Medicine

## 2016-11-18 ENCOUNTER — Ambulatory Visit (AMBULATORY_SURGERY_CENTER): Payer: Self-pay | Admitting: *Deleted

## 2016-11-18 VITALS — Ht 64.0 in | Wt 152.0 lb

## 2016-11-18 DIAGNOSIS — Z1211 Encounter for screening for malignant neoplasm of colon: Secondary | ICD-10-CM

## 2016-11-18 NOTE — Progress Notes (Addendum)
Patient denies any allergies to eggs or soy. Post-op n&v per pt. Patient denies any oxygen use at home and does not take any diet/weight loss medications. EMMI education declined by pt.

## 2016-11-19 ENCOUNTER — Encounter: Payer: Self-pay | Admitting: Internal Medicine

## 2016-12-02 ENCOUNTER — Ambulatory Visit (AMBULATORY_SURGERY_CENTER): Payer: BC Managed Care – PPO | Admitting: Internal Medicine

## 2016-12-02 ENCOUNTER — Encounter: Payer: Self-pay | Admitting: Internal Medicine

## 2016-12-02 VITALS — BP 108/61 | HR 64 | Temp 100.0°F | Resp 19 | Ht 64.0 in | Wt 152.0 lb

## 2016-12-02 DIAGNOSIS — Z1212 Encounter for screening for malignant neoplasm of rectum: Secondary | ICD-10-CM | POA: Diagnosis not present

## 2016-12-02 DIAGNOSIS — Z1211 Encounter for screening for malignant neoplasm of colon: Secondary | ICD-10-CM

## 2016-12-02 MED ORDER — SODIUM CHLORIDE 0.9 % IV SOLN: 500.0000 mL | INTRAVENOUS | Status: DC

## 2016-12-02 NOTE — Patient Instructions (Addendum)
No polyps or cancer seen.  You do have a condition called diverticulosis - common and not usually a problem. Please read the handout provided.  Next routine colonoscopy or other screening test in 10 years - 2028  I appreciate the opportunity to care for you. Gatha Mayer, MD, FACG   YOU HAD AN ENDOSCOPIC PROCEDURE TODAY AT [facility]:   Refer to the procedure report that was given to you for any specific questions about what was found during the examination.  If the procedure report does not answer your questions, please call your gastroenterologist to clarify.  If you requested that your care partner not be given the details of your procedure findings, then the procedure report has been included in a sealed envelope for you to review at your convenience later.  YOU SHOULD EXPECT: Some feelings of bloating in the abdomen. Passage of more gas than usual.  Walking can help get rid of the air that was put into your GI tract during the procedure and reduce the bloating. If you had a lower endoscopy (such as a colonoscopy or flexible sigmoidoscopy) you may notice spotting of blood in your stool or on the toilet paper. If you underwent a bowel prep for your procedure, you may not have a normal bowel movement for a few days.  Please Note:  You might notice some irritation and congestion in your nose or some drainage.  This is from the oxygen used during your procedure.  There is no need for concern and it should clear up in a day or so.  SYMPTOMS TO REPORT IMMEDIATELY:   Following lower endoscopy (colonoscopy or flexible sigmoidoscopy):  Excessive amounts of blood in the stool  Significant tenderness or worsening of abdominal pains  Swelling of the abdomen that is new, acute  Fever of 100F or higher    For urgent or emergent issues, a gastroenterologist can be reached at any hour by calling [phone].   DIET:  We do recommend a small meal at first, but then  you may proceed to your regular diet.  Drink plenty of fluids but you should avoid alcoholic beverages for 24 hours.  ACTIVITY:  You should plan to take it easy for the rest of today and you should NOT DRIVE or use heavy machinery until tomorrow (because of the sedation medicines used during the test).    FOLLOW UP: Our staff will call the number listed on your records the next business day following your procedure to check on you and address any questions or concerns that you may have regarding the information given to you following your procedure. If we do not reach you, we will leave a message.  However, if you are feeling well and you are not experiencing any problems, there is no need to return our call.  We will assume that you have returned to your regular daily activities without incident.  If any biopsies were taken you will be contacted by phone or by letter within the next 1-3 weeks.  Please call us at [phone] if you have not heard about the biopsies in 3 weeks.    SIGNATURES/CONFIDENTIALITY: You and/or your care partner have signed paperwork which will be entered into your electronic medical record.  These signatures attest to the fact that that the information above on your After Visit Summary has been reviewed and is understood.  Full responsibility of the confidentiality of this discharge information lies with you and/or your care-partner.

## 2016-12-02 NOTE — Op Note (Signed)
[facility] Patient Name: Jenny Soto Procedure Date: 12/02/2016 8:48 AM MRN: [id number] Endoscopist: Gatha Mayer , MD Age: 66 Referring MD:  Date of Birth: 08/17/1951 Gender: Female Account #: [id number] Procedure:                Colonoscopy Indications:              Screening for colorectal malignant neoplasm, Last                            colonoscopy: 2005 Medicines:                Propofol per Anesthesia, Monitored Anesthesia Care Procedure:                Pre-Anesthesia Assessment:                           - Prior to the procedure, a History and Physical                            was performed, and patient medications and                            allergies were reviewed. The patient's tolerance of                            previous anesthesia was also reviewed. The risks                            and benefits of the procedure and the sedation                            options and risks were discussed with the patient.                            All questions were answered, and informed consent                            was obtained. Prior Anticoagulants: The patient has                            taken no previous anticoagulant or antiplatelet                            agents. ASA Grade Assessment: III - A patient with                            severe systemic disease. After reviewing the risks                            and benefits, the patient was deemed in                            satisfactory condition to undergo the procedure.  After obtaining informed consent, the colonoscope                            was passed under direct vision. Throughout the                            procedure, the patient's blood pressure, pulse, and                            oxygen saturations were monitored continuously. The                            Colonoscope was introduced through the anus and                            advanced to the the  cecum, identified by                            appendiceal orifice and ileocecal valve. The                            colonoscopy was performed without difficulty. The                            patient tolerated the procedure well. The quality                            of the bowel preparation was excellent. The                            ileocecal valve, appendiceal orifice, and rectum                            were photographed. The bowel preparation used was                            Miralax. Scope In: 9:00:11 AM Scope Out: 9:14:06 AM Scope Withdrawal Time: 0 hours 10 minutes 15 seconds  Total Procedure Duration: 0 hours 13 minutes 55 seconds  Findings:                 The perianal and digital rectal examinations were                            normal.                           Multiple small and large-mouthed diverticula were                            found in the sigmoid colon.                           The exam was otherwise without abnormality on  direct and retroflexion views. Complications:            No immediate complications. Estimated Blood Loss:     Estimated blood loss: none. Impression:               - Diverticulosis in the sigmoid colon.                           - The examination was otherwise normal on direct                            and retroflexion views.                           - No specimens collected. Recommendation:           - Patient has a contact number available for                            emergencies. The signs and symptoms of potential                            delayed complications were discussed with the                            patient. Return to normal activities tomorrow.                            Written discharge instructions were provided to the                            patient.                           - Resume previous diet.                           - Continue present medications.                            - Repeat colonoscopy other appropriate test in 10                            years for screening purposes. Gatha Mayer, MD 12/02/2016 9:19:54 AM This report has been signed electronically.

## 2016-12-02 NOTE — Progress Notes (Signed)
Pt's states no medical or surgical changes since previsit or office visit. 

## 2016-12-02 NOTE — Progress Notes (Signed)
Report given to PACU, vss 

## 2016-12-03 ENCOUNTER — Telehealth: Payer: Self-pay | Admitting: *Deleted

## 2016-12-03 NOTE — Telephone Encounter (Signed)
  Follow up Call-  Call back number 12/02/2016  Post procedure Call Back phone  # [phone]  Permission to leave phone message Yes  Some recent data might be hidden     Patient questions:  Do you have a fever, pain , or abdominal swelling? No. Pain Score  0 *  Have you tolerated food without any problems? Yes.    Have you been able to return to your normal activities? Yes.    Do you have any questions about your discharge instructions: Diet   No. Medications  No. Follow up visit  No.  Do you have questions or concerns about your Care? No.  Actions: * If pain score is 4 or above: No action needed, pain <4.

## 2016-12-03 NOTE — Telephone Encounter (Signed)
  Follow up Call-  Call back number 12/02/2016  Post procedure Call Back phone  # [phone]  Permission to leave phone message Yes  Some recent data might be hidden     Patient questions:  Do you have a fever, pain , or abdominal swelling? No. Pain Score  0 *  Have you tolerated food without any problems? Yes.    Have you been able to return to your normal activities? Yes.    Do you have any questions about your discharge instructions: Diet   No. Medications  No. Follow up visit  No.  Do you have questions or concerns about your Care? No.  Actions: * If pain score is 4 or above: No action needed, pain <4.

## 2017-07-09 ENCOUNTER — Telehealth: Payer: Self-pay | Admitting: Oncology

## 2017-07-09 NOTE — Telephone Encounter (Signed)
Spoke with patient and rescheduled appt per 11/14 los.

## 2017-08-07 ENCOUNTER — Ambulatory Visit: Payer: BC Managed Care – PPO | Admitting: Oncology

## 2017-08-07 ENCOUNTER — Other Ambulatory Visit: Payer: Self-pay | Admitting: *Deleted

## 2017-08-07 ENCOUNTER — Other Ambulatory Visit: Payer: BC Managed Care – PPO

## 2017-08-07 DIAGNOSIS — C649 Malignant neoplasm of unspecified kidney, except renal pelvis: Secondary | ICD-10-CM

## 2017-08-08 ENCOUNTER — Other Ambulatory Visit (HOSPITAL_BASED_OUTPATIENT_CLINIC_OR_DEPARTMENT_OTHER): Payer: BC Managed Care – PPO

## 2017-08-08 ENCOUNTER — Ambulatory Visit: Payer: BC Managed Care – PPO | Admitting: Oncology

## 2017-08-08 ENCOUNTER — Telehealth: Payer: Self-pay | Admitting: Oncology

## 2017-08-08 VITALS — BP 147/80 | HR 73 | Temp 97.9°F | Resp 18 | Ht 64.0 in | Wt 152.2 lb

## 2017-08-08 DIAGNOSIS — C649 Malignant neoplasm of unspecified kidney, except renal pelvis: Secondary | ICD-10-CM

## 2017-08-08 DIAGNOSIS — Z85528 Personal history of other malignant neoplasm of kidney: Secondary | ICD-10-CM

## 2017-08-08 LAB — COMPREHENSIVE METABOLIC PANEL
ALT: 19 U/L (ref 0–55)
AST: 19 U/L (ref 5–34)
Albumin: 4.1 g/dL (ref 3.5–5.0)
Alkaline Phosphatase: 74 U/L (ref 40–150)
Anion Gap: 11 mEq/L (ref 3–11)
BUN: 13.8 mg/dL (ref 7.0–26.0)
CALCIUM: 10 mg/dL (ref 8.4–10.4)
CHLORIDE: 105 meq/L (ref 98–109)
CO2: 23 meq/L (ref 22–29)
Creatinine: 1 mg/dL (ref 0.6–1.1)
Glucose: 109 mg/dl (ref 70–140)
Potassium: 4.5 mEq/L (ref 3.5–5.1)
Sodium: 140 mEq/L (ref 136–145)
Total Bilirubin: 0.48 mg/dL (ref 0.20–1.20)
Total Protein: 7.8 g/dL (ref 6.4–8.3)

## 2017-08-08 LAB — CBC WITH DIFFERENTIAL/PLATELET
BASO%: 0.6 % (ref 0.0–2.0)
Basophils Absolute: 0 10*3/uL (ref 0.0–0.1)
EOS%: 3.1 % (ref 0.0–7.0)
Eosinophils Absolute: 0.2 10*3/uL (ref 0.0–0.5)
HEMATOCRIT: 42.1 % (ref 34.8–46.6)
HGB: 13.5 g/dL (ref 11.6–15.9)
LYMPH#: 2.8 10*3/uL (ref 0.9–3.3)
LYMPH%: 42.3 % (ref 14.0–49.7)
MCH: 32.4 pg (ref 25.1–34.0)
MCHC: 32.1 g/dL (ref 31.5–36.0)
MCV: 101 fL (ref 79.5–101.0)
MONO#: 0.5 10*3/uL (ref 0.1–0.9)
MONO%: 8 % (ref 0.0–14.0)
NEUT#: 3.1 10*3/uL (ref 1.5–6.5)
NEUT%: 46 % (ref 38.4–76.8)
PLATELETS: 319 10*3/uL (ref 145–400)
RBC: 4.17 10*6/uL (ref 3.70–5.45)
RDW: 13.6 % (ref 11.2–14.5)
WBC: 6.7 10*3/uL (ref 3.9–10.3)

## 2017-08-08 NOTE — Progress Notes (Signed)
Hematology and Oncology Follow Up Visit  Jenny Soto [id number] 08/05/1951 66 y.o. 08/08/2017 11:54 AM  [name], M.D.  Sarpy Rosana Hoes, M.D.  [name], MD    Principle Diagnosis: 66 year old female with advanced renal cell carcinoma initially presented in 2006.  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  She developed isolated lung metastasis in 2009.  Prior Therapy:  1. Status post nephrectomy in 2006.   2. The patient underwent surgical resection, VATS procedure and wedge resection of the right lower lobe done in 2009.  The pathology showed recurrence of her original renal cell carcinoma.  Current therapy: Active surveillance.  Interim History: Jenny Soto presents today for a follow-up visit. Since her last visit, she reports no complaints.  She continues to be in excellent health without any evidence to suggest recurrent disease.  She denies any abdominal pain, weight loss or hematuria.  She continues to be active including [place] She did not any constitutional symptoms of weight loss or appetite changes. She has not reported any back pain or pathological fractures.   She is not reporting pulmonary symptoms at this time including cough, shortness of breath or hemoptysis. She has not reported any headaches or blurry vision. Does not report any syncope or seizures. She reports no chest pain or leg edema. Does not report any lymphadenopathy or petechiae. She has not reported any frequency, urgency or hesitancy. The rest of symptoms review of systems unremarkable.  Medications: I have reviewed the patient's current medications. No current outpatient medications on file.  Allergies: No Known Allergies  Past Medical History, Surgical history, Social history, and Family History were reviewed and updated.   Blood pressure (!) 147/80, pulse 73, temperature 97.9 F (36.6 C), temperature source Oral,  resp. rate 18, height 5\' 4"  (1.626 m), weight 152 lb 3.2 oz (69 kg), SpO2 100 %. ECOG: 0 General appearance: Alert, awake woman without distress. Head: Normocephalic, without obvious abnormality no oral ulcers or lesions. Neck: no adenopathy Lymph nodes: Cervical, supraclavicular, and axillary nodes normal. Heart:regular rate and rhythm, S1, S2 normal, no murmur, click, rub or gallop Lung:chest clear, no wheezing, rales.  No dullness to percussion. Abdomin: soft, non-tender, without masses or organomegaly .  No shifting dullness or ascites. EXT:no erythema, induration, or nodules   Lab Results: Lab Results  Component Value Date   WBC 6.7 08/08/2017   HGB 13.5 08/08/2017   HCT 42.1 08/08/2017   MCV 101.0 08/08/2017   PLT 319 08/08/2017     Chemistry      Component Value Date/Time   NA 143 10/02/2016 0911   NA 139 08/07/2016 1507   K 4.5 10/02/2016 0911   K 4.4 08/07/2016 1507   CL 105 10/02/2016 0911   CL 105 11/11/2012 0956   CO2 24 10/02/2016 0911   CO2 24 08/07/2016 1507   BUN 10 10/02/2016 0911   BUN 14.5 08/07/2016 1507   CREATININE 1.03 (H) 10/02/2016 0911   CREATININE 1.1 08/07/2016 1507      Component Value Date/Time   CALCIUM 9.9 10/02/2016 0911   CALCIUM 10.2 08/07/2016 1507   ALKPHOS 62 10/02/2016 0911   ALKPHOS 65 08/07/2016 1507   AST 21 10/02/2016 0911   AST 22 08/07/2016 1507   ALT 19 10/02/2016 0911   ALT 23 08/07/2016 1507   BILITOT 0.7 10/02/2016 0911   BILITOT 0.53 08/07/2016 1507     EXAM:  CT CHEST, ABDOMEN, AND PELVIS WITH CONTRAST  TECHNIQUE: Multidetector CT imaging of the chest, abdomen and pelvis was performed following the standard protocol during bolus administration of intravenous contrast.  CONTRAST:  116mL ISOVUE-300 IOPAMIDOL (ISOVUE-300) INJECTION 61%  COMPARISON:  CT CAP 09/07/2015.  FINDINGS: CT CHEST FINDINGS  Cardiovascular: Normal heart size. No pericardial effusion. Aorta and main pulmonary artery are normal  in caliber.  Mediastinum/Nodes: No enlarged axillary, mediastinal or hilar lymphadenopathy. The esophagus is normal in appearance.  Lungs/Pleura: Central airways are patent. Dependent atelectasis within the bilateral lower lobes. Unchanged small area of ground-glass attenuation within the lingula (image 22; series 13). Dependent atelectasis within the bilateral lower lobes. Stable scarring right lower lobe. No pleural effusion or pneumothorax.  Musculoskeletal: No aggressive or acute appearing osseous lesions. Thoracic spine degenerative changes.  CT ABDOMEN PELVIS FINDINGS  Hepatobiliary: Liver is normal in size and contour. Stable scattered well-circumscribed subcentimeter low-attenuation lesions within the liver, favored to represent small cysts. No suspicious hepatic lesion identified. Prior cholecystectomy.  Pancreas: Unremarkable  Spleen: Status post splenectomy. Stable residual splenic tissue within the left upper quadrant.  Adrenals/Urinary Tract: The adrenal glands are normal. Prior left nephrectomy. Unchanged too small to characterize lesions within the right kidney. No hydronephrosis. Urinary bladder is unremarkable.  Stomach/Bowel: Normal morphology of the stomach. No abnormal bowel wall thickening or evidence for bowel obstruction. No free fluid or free intraperitoneal air.  Vascular/Lymphatic: Normal caliber abdominal aorta. Peripheral calcified atherosclerotic plaque. No retroperitoneal lymphadenopathy. 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  Reproductive: Stable calcifications within the uterus, potentially due to fibroids. No adnexal mass identified.  Other: None.  Musculoskeletal: No aggressive or acute appearing osseous lesions. Lumbar spine degenerative changes.  IMPRESSION: Patient status post left nephrectomy without evidence for locally recurrent or metastatic disease.  Stable postsurgical changes right lower  lobe.   Impression and Plan:  66 year old female with the following issues. 1. Renal cell carcinoma initially diagnosed at a T3 disease status post nephrectomy in 2006.  She had a pulmonary metastasis resected in 2009 and has been disease-free since.  Her last CT scan on September 03, 2016 was reviewed again and showed no evidence of recurrent disease.  The plan is to continue with active surveillance with annual visits and annual CT scan for the time being.  Risks and benefits of continuing imaging studies were discussed today she is agreeable to continue.  She understands there is a risk of recurrence even late with these particular tumors.  She also understands the risk of radiation exposure that is minimal at this time. 2. Ventral hernia noted on exam as well as on CT scan. She is status post repair in February 2017. 3. Age-appropriate cancer screening: She is up-to-date on her mammography as well.  I will continue to address colonoscopy with her moving forward. 4. Follow-up: Will be in December 2019.  Her next CT scan will be January 2019.  Zola Button, MD 12/14/201811:54 AM

## 2017-08-08 NOTE — Telephone Encounter (Signed)
Gave avs and calendar for December 2019 °

## 2017-09-02 ENCOUNTER — Ambulatory Visit (HOSPITAL_COMMUNITY)
Admission: RE | Admit: 2017-09-02 | Discharge: 2017-09-02 | Disposition: A | Discharge status: Home / Self Care | Payer: BC Managed Care – PPO | Source: Ambulatory Visit | Attending: Oncology | Admitting: Oncology

## 2017-09-02 ENCOUNTER — Other Ambulatory Visit: Payer: Self-pay | Admitting: Oncology

## 2017-09-02 DIAGNOSIS — Z905 Acquired absence of kidney: Secondary | ICD-10-CM | POA: Insufficient documentation

## 2017-09-02 DIAGNOSIS — C649 Malignant neoplasm of unspecified kidney, except renal pelvis: Secondary | ICD-10-CM

## 2017-09-02 DIAGNOSIS — D259 Leiomyoma of uterus, unspecified: Secondary | ICD-10-CM | POA: Diagnosis not present

## 2017-09-02 DIAGNOSIS — K5730 Diverticulosis of large intestine without perforation or abscess without bleeding: Secondary | ICD-10-CM | POA: Insufficient documentation

## 2017-09-02 MED ORDER — IOPAMIDOL (ISOVUE-300) INJECTION 61%
INTRAVENOUS | Status: AC
  Filled 2017-09-02: qty 100

## 2017-09-02 MED ORDER — IOPAMIDOL (ISOVUE-300) INJECTION 61%
100.0000 mL | Freq: Once | INTRAVENOUS | Status: AC | PRN
  Administered 2017-09-02: 100 mL via INTRAVENOUS

## 2017-09-08 ENCOUNTER — Telehealth: Payer: Self-pay | Admitting: *Deleted

## 2017-09-08 NOTE — Telephone Encounter (Signed)
Spoke with patient regarding dental procedure, bone graft around implant, scheduled for tomorrow. Per Dr. Alen Blew, he has no objection with this procedure. She verbalized understanding.

## 2017-09-10 ENCOUNTER — Encounter: Payer: Self-pay | Admitting: *Deleted

## 2017-09-10 ENCOUNTER — Ambulatory Visit
Admission: RE | Admit: 2017-09-10 | Discharge: 2017-09-10 | Disposition: A | Discharge status: Home / Self Care | Payer: BC Managed Care – PPO | Source: Ambulatory Visit | Attending: Oncology | Admitting: Oncology

## 2017-09-10 DIAGNOSIS — C649 Malignant neoplasm of unspecified kidney, except renal pelvis: Secondary | ICD-10-CM

## 2017-09-10 HISTORY — PX: IR RADIOLOGIST EVAL & MGMT: IMG5224

## 2017-09-10 NOTE — Consult Note (Signed)
Chief Complaint: Patient was seen in consultation today for  Chief Complaint  Patient presents with  . Advice Only    Consult for Right Renal Mass     at the request of Shadad,Firas N  Referring Physician(s): Shadad,Firas N  History of Present Illness: Jenny Soto is a 67 y.o. female with history of renal cell carcinoma. Patient had a large left renal mass resected in 2006. This was a 12 cm grade 3 clear cell histology tumor. Patient had resection of a solitary metastatic lung lesion in 2009. Patient is in good health without any new complaints. Patient had a surveillance CT last week. Unfortunately, the CT demonstrated a new enhancing lesion in the right kidney upper pole. This lesion is completely new from the 12 month comparison. Patient has no urinary symptoms. She denies dysuria, hematuria or flank pain. No change in her activity level, weight or diet. She denies any chest pain, breathing problems or changes in her bowel habits.  Past Medical History:  Diagnosis Date  . Cancer ([place]) [id number]   kidney, lung  . Gallstones   . History of hiatal hernia   . Hyperlipidemia   . PONV (postoperative nausea and vomiting)     Past Surgical History:  Procedure Laterality Date  . BLADDER SUSPENSION  july 2011 and nov 2011  . COLONOSCOPY    . [place]  . INCISIONAL HERNIA REPAIR N/A 10/04/2015   Procedure: LAPAROSCOPIC INCISIONAL HERNIA;  Surgeon: Ralene Ok, MD;  Location: [facility];  Service: General;  Laterality: N/A;  . INSERTION OF MESH N/A 10/04/2015   Procedure: WITH INSERTION OF MESH;  Surgeon: Ralene Ok, MD;  Location: [facility];  Service: General;  Laterality: N/A;  . IR RADIOLOGIST EVAL & MGMT  09/10/2017  . LAPAROSCOPIC CHOLECYSTECTOMY  05/29/11  . LOBECTOMY  2009   right lower lung  . NEPHRECTOMY  october 2006   left kidney  . [place]  2005  . POLYPECTOMY  1985   vocal cord  . RHINOPLASTY  1985  . SPLENECTOMY, TOTAL  october  2006  . TUBAL LIGATION  1983  . TUMOR REMOVAL  1992   ovarian durmoid tumors bilateral    Allergies: Oxycodone-acetaminophen  Medications: Prior to Admission medications   Not on File     Family History  Adopted: Yes  Problem Relation Age of Onset  . Heart disease Mother   . Colon cancer Neg Hx     Social History   Socioeconomic History  . Marital status: Married    Spouse name: Not on file  . Number of children: Not on file  . Years of education: Not on file  . Highest education level: Not on file  Social Needs  . Financial resource strain: Not on file  . Food insecurity - worry: Not on file  . Food insecurity - inability: Not on file  . Transportation needs - medical: Not on file  . Transportation needs - non-medical: Not on file  Occupational History  . Not on file  Tobacco Use  . Smoking status: Former Smoker    Packs/day: 0.50    Years: 20.00    Pack years: 10.00    Types: Cigarettes    Last attempt to quit: 08/26/2004    Years since quitting: 13.0  . Smokeless tobacco: Never Used  Substance and Sexual Activity  . Alcohol use: Yes    Alcohol/week: 3.6 oz    Types: 3 Glasses of wine, 3  Cans of beer per week    Comment: wine or beer every night  . Drug use: No  . Sexual activity: Yes    Birth control/protection: Surgical  Other Topics Concern  . Not on file  Social History Narrative  . Not on file    ECOG Status: 0 - Asymptomatic  Review of Systems: A 12 point ROS discussed and pertinent positives are indicated in the HPI above.  All other systems are negative.  Review of Systems  Constitutional: Negative.   Respiratory: Negative.   Cardiovascular: Negative.   Gastrointestinal: Negative.   Genitourinary: Negative.   Neurological: Negative.     Vital Signs: BP (!) 145/77   Pulse 94   Temp 98.2 F (36.8 C) (Oral)   Resp 14   Ht 5\' 4"  (1.626 m)   Wt 150 lb (68 kg)   SpO2 97%   BMI 25.75 kg/m   Physical Exam  Constitutional: No  distress.  HENT:  Mouth/Throat: Oropharynx is clear and moist.  Cardiovascular: Normal rate, regular rhythm and normal heart sounds.  Pulmonary/Chest: Effort normal and breath sounds normal.  Abdominal: Soft. She exhibits no distension. There is no tenderness.  Musculoskeletal: She exhibits no edema.    Mallampati Score: 2     Imaging: Ct Abdomen Pelvis W Wo Contrast  Result Date: 09/02/2017 CLINICAL DATA:  Follow-up metastatic renal cell carcinoma. EXAM: CT CHEST, ABDOMEN, AND PELVIS WITH CONTRAST TECHNIQUE: Multidetector CT imaging of the chest, abdomen and pelvis was performed following the standard protocol during bolus administration of intravenous contrast. CONTRAST:  177mL ISOVUE-300 IOPAMIDOL (ISOVUE-300) INJECTION 61% COMPARISON:  09/03/2016 FINDINGS: CT CHEST FINDINGS Cardiovascular: No acute findings. Mediastinum/Lymph Nodes: No masses or pathologically enlarged lymph nodes identified. Lungs/Pleura: Stable postsurgical scarring in right lower lobe. No suspicious pulmonary nodules masses identified. No evidence of pulmonary infiltrate or pleural effusion. Musculoskeletal:  No suspicious bone lesions identified. CT ABDOMEN AND PELVIS FINDINGS Hepatobiliary: No masses identified. Prior cholecystectomy. No evidence of biliary obstruction. Pancreas:  No mass or inflammatory changes. Spleen: Prior splenectomy with stable small accessory splenule in the posterior left upper quadrant. Adrenals/Urinary tract: Stable postop changes from previous left nephrectomy. A new hypervascular mass is seen in the upper pole of the right kidney which measures 2.6 x 2 1 cm. This is consistent renal cell carcinoma. Sub-cm right renal cyst remains stable, and there is no evidence of hydronephrosis. Unremarkable unopacified urinary bladder. Stomach/Bowel: No evidence of obstruction, inflammatory process, or abnormal fluid collections. Normal appendix visualized. Mild left colonic diverticulosis is seen, without  evidence of diverticulitis. Vascular/Lymphatic: No pathologically enlarged lymph nodes identified. No abdominal aortic aneurysm. Aortic atherosclerosis. Reproductive: Stable tiny less than 1 cm calcified uterine fibroids. Adnexal regions are unremarkable. Other:  None. Musculoskeletal:  No suspicious bone lesions identified. IMPRESSION: New 2.6 cm hypervascular mass in upper pole of right kidney, consistent with renal cell carcinoma. Stable postop changes from previous left nephrectomy. No evidence of metastatic disease within the chest, abdomen, or pelvis. Small calcified uterine fibroids. Colonic diverticulosis, without radiographic evidence of diverticulitis. Electronically Signed   By: Earle Gell M.D.   On: 09/02/2017 13:04   Ct Chest W Contrast  Result Date: 09/02/2017 CLINICAL DATA:  Follow-up metastatic renal cell carcinoma. EXAM: CT CHEST, ABDOMEN, AND PELVIS WITH CONTRAST TECHNIQUE: Multidetector CT imaging of the chest, abdomen and pelvis was performed following the standard protocol during bolus administration of intravenous contrast. CONTRAST:  167mL ISOVUE-300 IOPAMIDOL (ISOVUE-300) INJECTION 61% COMPARISON:  09/03/2016 FINDINGS: CT CHEST FINDINGS  Cardiovascular: No acute findings. Mediastinum/Lymph Nodes: No masses or pathologically enlarged lymph nodes identified. Lungs/Pleura: Stable postsurgical scarring in right lower lobe. No suspicious pulmonary nodules masses identified. No evidence of pulmonary infiltrate or pleural effusion. Musculoskeletal:  No suspicious bone lesions identified. CT ABDOMEN AND PELVIS FINDINGS Hepatobiliary: No masses identified. Prior cholecystectomy. No evidence of biliary obstruction. Pancreas:  No mass or inflammatory changes. Spleen: Prior splenectomy with stable small accessory splenule in the posterior left upper quadrant. Adrenals/Urinary tract: Stable postop changes from previous left nephrectomy. A new hypervascular mass is seen in the upper pole of the right  kidney which measures 2.6 x 2 1 cm. This is consistent renal cell carcinoma. Sub-cm right renal cyst remains stable, and there is no evidence of hydronephrosis. Unremarkable unopacified urinary bladder. Stomach/Bowel: No evidence of obstruction, inflammatory process, or abnormal fluid collections. Normal appendix visualized. Mild left colonic diverticulosis is seen, without evidence of diverticulitis. Vascular/Lymphatic: No pathologically enlarged lymph nodes identified. No abdominal aortic aneurysm. Aortic atherosclerosis. Reproductive: Stable tiny less than 1 cm calcified uterine fibroids. Adnexal regions are unremarkable. Other:  None. Musculoskeletal:  No suspicious bone lesions identified. IMPRESSION: New 2.6 cm hypervascular mass in upper pole of right kidney, consistent with renal cell carcinoma. Stable postop changes from previous left nephrectomy. No evidence of metastatic disease within the chest, abdomen, or pelvis. Small calcified uterine fibroids. Colonic diverticulosis, without radiographic evidence of diverticulitis. Electronically Signed   By: Earle Gell M.D.   On: 09/02/2017 13:04   Ir Radiologist Eval & Mgmt  Result Date: 09/10/2017 Please refer to notes tab for details about interventional procedure. (Op Note)   Labs:  CBC: Recent Labs    10/02/16 0911 08/08/17 1122  WBC 5.5 6.7  HGB 13.7 13.5  HCT 41.1 42.1  PLT 302 319    COAGS: No results for input(s): INR, APTT in the last 8760 hours.  BMP: Recent Labs    10/02/16 0911 08/08/17 1122  NA 143 140  K 4.5 4.5  CL 105  --   CO2 24 23  GLUCOSE 109* 109  BUN 10 13.8  CALCIUM 9.9 10.0  CREATININE 1.03* 1.0  GFRNONAA 58*  --   GFRAA 67  --     LIVER FUNCTION TESTS: Recent Labs    10/02/16 0911 08/08/17 1122  BILITOT 0.7 0.48  AST 21 19  ALT 19 19  ALKPHOS 62 74  PROT 6.9 7.8  ALBUMIN 4.4 4.1    TUMOR MARKERS: No results for input(s): AFPTM, CEA, CA199, CHROMGRNA in the last 8760  hours.  Assessment and Plan:  67 year old female with a solitary right kidney and a new lesion in the right kidney upper pole. The lesion is highly concerning for a renal cell carcinoma based on imaging characteristics and based on her history. The lesion appears to be completely new since January 2018 and suggesting this lesion is growing relatively quickly. We discussed the management options which include surveillance, surgical resection and percutaneous ablation. Would not recommend surveillance because I think this lesion is growing rapidly. The upper pole location would probably be suitable for partial nephrectomy but percutaneous ablation will likely be more nephron-sparing than partial nephrectomy. However, patient was explained that surgical resection is the gold standard and that ablation has risk for incomplete treatment and local recurrence.  We discussed image guided cryoablation of this lesion in depth. We discussed the need for general anesthesia and overnight observation. Plan to biopsy the lesion during the procedure if possible. We discussed the  potential risks which include incomplete treatment, local recurrence, hemorrhage, infection and renal function loss. Patient has a very good understanding of the procedure and would like to pursue percutaneous cryoablation of this lesion as soon as possible. I think the patient is a good candidate for the procedure and we will schedule the procedure as soon as possible. Plan to present this patient at the GU oncology conference as well.  Thank you for this interesting consult.  I greatly enjoyed meeting Jenny Soto and look forward to participating in their care.  A copy of this report was sent to the requesting provider on this date.  Electronically Signed: Burman Riis 09/10/2017, 3:56 PM   I spent a total of  30 Minutes   in face to face in clinical consultation, greater than 50% of which was counseling/coordinating care for renal cell  carcinoma and new renal lesion.

## 2017-09-15 ENCOUNTER — Other Ambulatory Visit (HOSPITAL_COMMUNITY): Payer: Self-pay | Admitting: Diagnostic Radiology

## 2017-09-15 DIAGNOSIS — N2889 Other specified disorders of kidney and ureter: Secondary | ICD-10-CM

## 2017-09-16 ENCOUNTER — Other Ambulatory Visit: Payer: BC Managed Care – PPO

## 2017-09-19 ENCOUNTER — Encounter (HOSPITAL_COMMUNITY): Payer: Self-pay

## 2017-09-19 NOTE — Patient Instructions (Addendum)
Sherby Moncayo Horrigan  09/19/2017   Your procedure is scheduled on: 09-26-17  Report to [facility] Main  Entrance              Report to ADMITTING AT 1000 AM   Call this number if you have problems the morning of surgery [phone]    Remember: Do not eat food or drink liquids :After Midnight.     Take these medicines the morning of surgery with A SIP OF WATER: none                                You may not have any metal on your body including hair pins and              piercings  Do not wear jewelry, make-up, lotions, powders or perfumes, deodorant             Do not wear nail polish.  Do not shave  48 hours prior to surgery.     Do not bring valuables to the hospital. [place].  Contacts, dentures or bridgework may not be worn into surgery.  Leave suitcase in the car. After surgery it may be brought to your room.                 Please read over the following fact sheets you were given: _____________________________________________________________________           [facility] - Preparing for Surgery Before surgery, you can play an important role.  Because skin is not sterile, your skin needs to be as free of germs as possible.  You can reduce the number of germs on your skin by washing with CHG (chlorahexidine gluconate) soap before surgery.  CHG is an antiseptic cleaner which kills germs and bonds with the skin to continue killing germs even after washing. Please DO NOT use if you have an allergy to CHG or antibacterial soaps.  If your skin becomes reddened/irritated stop using the CHG and inform your nurse when you arrive at Short Stay. Do not shave (including legs and underarms) for at least 48 hours prior to the first CHG shower.  You may shave your face/neck. Please follow these instructions carefully:  1.  Shower with CHG Soap the night before surgery and the  morning of Surgery.  2.  If you choose to  wash your hair, wash your hair first as usual with your  normal  shampoo.  3.  After you shampoo, rinse your hair and body thoroughly to remove the  shampoo.                           4.  Use CHG as you would any other liquid soap.  You can apply chg directly  to the skin and wash                       Gently with a scrungie or clean washcloth.  5.  Apply the CHG Soap to your body ONLY FROM THE NECK DOWN.   Do not use on face/ open  Wound or open sores. Avoid contact with eyes, ears mouth and genitals (private parts).                       Wash face,  Genitals (private parts) with your normal soap.             6.  Wash thoroughly, paying special attention to the area where your surgery  will be performed.  7.  Thoroughly rinse your body with warm water from the neck down.  8.  DO NOT shower/wash with your normal soap after using and rinsing off  the CHG Soap.                9.  Pat yourself dry with a clean towel.            10.  Wear clean pajamas.            11.  Place clean sheets on your bed the night of your first shower and do not  sleep with pets. Day of Surgery : Do not apply any lotions/deodorants the morning of surgery.  Please wear clean clothes to the hospital/surgery center.  FAILURE TO FOLLOW THESE INSTRUCTIONS MAY RESULT IN THE CANCELLATION OF YOUR SURGERY PATIENT SIGNATURE_________________________________  NURSE SIGNATURE__________________________________  ________________________________________________________________________

## 2017-09-19 NOTE — Progress Notes (Signed)
PLEASE PLACE ORDERS IN Epic PT. HAS A PREOP APPT. 09-23-17 @1 :00 PM. THANK YOU!

## 2017-09-23 ENCOUNTER — Other Ambulatory Visit: Payer: Self-pay | Admitting: Radiology

## 2017-09-23 ENCOUNTER — Other Ambulatory Visit: Payer: Self-pay

## 2017-09-23 ENCOUNTER — Encounter (HOSPITAL_COMMUNITY): Payer: Self-pay

## 2017-09-23 ENCOUNTER — Encounter (HOSPITAL_COMMUNITY)
Admission: RE | Admit: 2017-09-23 | Discharge: 2017-09-23 | Disposition: A | Discharge status: Home / Self Care | Payer: BC Managed Care – PPO | Source: Ambulatory Visit | Attending: Diagnostic Radiology | Admitting: Diagnostic Radiology

## 2017-09-23 DIAGNOSIS — Z85528 Personal history of other malignant neoplasm of kidney: Secondary | ICD-10-CM | POA: Diagnosis not present

## 2017-09-23 DIAGNOSIS — Z87891 Personal history of nicotine dependence: Secondary | ICD-10-CM | POA: Diagnosis not present

## 2017-09-23 DIAGNOSIS — Z905 Acquired absence of kidney: Secondary | ICD-10-CM | POA: Diagnosis not present

## 2017-09-23 DIAGNOSIS — N2889 Other specified disorders of kidney and ureter: Secondary | ICD-10-CM | POA: Diagnosis present

## 2017-09-23 HISTORY — DX: Other specified diseases of gallbladder: K82.8

## 2017-09-23 HISTORY — DX: Other specified disorders of kidney and ureter: N28.89

## 2017-09-23 LAB — CBC WITH DIFFERENTIAL/PLATELET
BASOS ABS: 0.1 10*3/uL (ref 0.0–0.1)
BASOS PCT: 1 %
Eosinophils Absolute: 0.3 10*3/uL (ref 0.0–0.7)
Eosinophils Relative: 4 %
HEMATOCRIT: 42.8 % (ref 36.0–46.0)
HEMOGLOBIN: 13.9 g/dL (ref 12.0–15.0)
Lymphocytes Relative: 37 %
Lymphs Abs: 2.7 10*3/uL (ref 0.7–4.0)
MCH: 32.4 pg (ref 26.0–34.0)
MCHC: 32.5 g/dL (ref 30.0–36.0)
MCV: 99.8 fL (ref 78.0–100.0)
MONOS PCT: 9 %
Monocytes Absolute: 0.7 10*3/uL (ref 0.1–1.0)
NEUTROS ABS: 3.6 10*3/uL (ref 1.7–7.7)
NEUTROS PCT: 49 %
Platelets: 368 10*3/uL (ref 150–400)
RBC: 4.29 MIL/uL (ref 3.87–5.11)
RDW: 13.5 % (ref 11.5–15.5)
WBC: 7.4 10*3/uL (ref 4.0–10.5)

## 2017-09-23 LAB — BASIC METABOLIC PANEL
ANION GAP: 8 (ref 5–15)
BUN: 17 mg/dL (ref 6–20)
CALCIUM: 10.3 mg/dL (ref 8.9–10.3)
CO2: 25 mmol/L (ref 22–32)
Chloride: 104 mmol/L (ref 101–111)
Creatinine, Ser: 0.91 mg/dL (ref 0.44–1.00)
GFR calc non Af Amer: >60 mL/min (ref >60)
Glucose, Bld: 95 mg/dL (ref 65–99)
Potassium: 4.5 mmol/L (ref 3.5–5.1)
Sodium: 137 mmol/L (ref 135–145)

## 2017-09-23 LAB — PROTIME-INR
INR: 0.99
Prothrombin Time: 13 seconds (ref 11.4–15.2)

## 2017-09-23 LAB — ABO/RH: ABO/RH(D): A NEG

## 2017-09-23 NOTE — Progress Notes (Signed)
Spoke with dr houser anesthesia and made aware pt history and anesthsia will see patient day of procedure 09-26-17

## 2017-09-23 NOTE — Progress Notes (Addendum)
NEED ORDERS IN Epic FOR 2-1- PROCEDURE WITH DR HENN, PRE OP IS 100 PM TODAY

## 2017-09-25 ENCOUNTER — Other Ambulatory Visit: Payer: Self-pay | Admitting: Radiology

## 2017-09-26 ENCOUNTER — Ambulatory Visit (HOSPITAL_COMMUNITY)
Admission: RE | Admit: 2017-09-26 | Discharge: 2017-09-26 | Disposition: A | Discharge status: Home / Self Care | Payer: BC Managed Care – PPO | Source: Ambulatory Visit | Attending: Diagnostic Radiology | Admitting: Diagnostic Radiology

## 2017-09-26 ENCOUNTER — Ambulatory Visit (HOSPITAL_COMMUNITY): Payer: BC Managed Care – PPO | Admitting: Anesthesiology

## 2017-09-26 ENCOUNTER — Encounter (HOSPITAL_COMMUNITY): Payer: Self-pay

## 2017-09-26 ENCOUNTER — Observation Stay (HOSPITAL_COMMUNITY)
Admission: RE | Admit: 2017-09-26 | Discharge: 2017-09-27 | Disposition: A | Discharge status: Home / Self Care | Payer: BC Managed Care – PPO | Source: Ambulatory Visit | Attending: Diagnostic Radiology | Admitting: Diagnostic Radiology

## 2017-09-26 ENCOUNTER — Other Ambulatory Visit: Payer: Self-pay

## 2017-09-26 ENCOUNTER — Encounter (HOSPITAL_COMMUNITY): Payer: Self-pay | Admitting: Anesthesiology

## 2017-09-26 ENCOUNTER — Encounter (HOSPITAL_COMMUNITY)
Admission: RE | Disposition: A | Discharge status: Home / Self Care | Payer: Self-pay | Source: Ambulatory Visit | Attending: Diagnostic Radiology

## 2017-09-26 ENCOUNTER — Encounter (HOSPITAL_COMMUNITY): Payer: Self-pay | Admitting: Certified Registered"

## 2017-09-26 DIAGNOSIS — N2889 Other specified disorders of kidney and ureter: Secondary | ICD-10-CM

## 2017-09-26 DIAGNOSIS — Z85528 Personal history of other malignant neoplasm of kidney: Secondary | ICD-10-CM | POA: Insufficient documentation

## 2017-09-26 DIAGNOSIS — Z905 Acquired absence of kidney: Secondary | ICD-10-CM | POA: Insufficient documentation

## 2017-09-26 DIAGNOSIS — Z87891 Personal history of nicotine dependence: Secondary | ICD-10-CM | POA: Insufficient documentation

## 2017-09-26 DIAGNOSIS — C641 Malignant neoplasm of right kidney, except renal pelvis: Secondary | ICD-10-CM | POA: Diagnosis present

## 2017-09-26 HISTORY — PX: RADIOFREQUENCY ABLATION: SHX2290

## 2017-09-26 LAB — TYPE AND SCREEN
ABO/RH(D): A NEG
ANTIBODY SCREEN: NEGATIVE

## 2017-09-26 SURGERY — RADIO FREQUENCY ABLATION
Anesthesia: General | Laterality: Right

## 2017-09-26 MED ORDER — DEXAMETHASONE SODIUM PHOSPHATE 10 MG/ML IJ SOLN
INTRAMUSCULAR | Status: DC | PRN
  Administered 2017-09-26: 10 mg via INTRAVENOUS

## 2017-09-26 MED ORDER — ACETAMINOPHEN 325 MG PO TABS
650.0000 mg | ORAL_TABLET | ORAL | Status: DC | PRN
  Administered 2017-09-26 – 2017-09-27 (×3): 650 mg via ORAL
  Filled 2017-09-26 (×3): qty 2

## 2017-09-26 MED ORDER — LIDOCAINE 2% (20 MG/ML) 5 ML SYRINGE
INTRAMUSCULAR | Status: DC | PRN
  Administered 2017-09-26: 100 mg via INTRAVENOUS

## 2017-09-26 MED ORDER — SCOPOLAMINE 1 MG/3DAYS TD PT72
MEDICATED_PATCH | TRANSDERMAL | Status: AC
  Filled 2017-09-26: qty 1

## 2017-09-26 MED ORDER — MIDAZOLAM HCL 5 MG/5ML IJ SOLN
INTRAMUSCULAR | Status: DC | PRN
  Administered 2017-09-26: 2 mg via INTRAVENOUS

## 2017-09-26 MED ORDER — IOPAMIDOL (ISOVUE-300) INJECTION 61%
INTRAVENOUS | Status: AC
  Filled 2017-09-26: qty 30

## 2017-09-26 MED ORDER — FENTANYL CITRATE (PF) 250 MCG/5ML IJ SOLN
INTRAMUSCULAR | Status: AC
  Filled 2017-09-26: qty 5

## 2017-09-26 MED ORDER — CEFAZOLIN SODIUM-DEXTROSE 2-4 GM/100ML-% IV SOLN
2.0000 g | INTRAVENOUS | Status: AC
  Administered 2017-09-26: 2 g via INTRAVENOUS
  Filled 2017-09-26: qty 100

## 2017-09-26 MED ORDER — IOPAMIDOL (ISOVUE-370) INJECTION 76%
INTRAVENOUS | Status: AC
  Filled 2017-09-26: qty 100

## 2017-09-26 MED ORDER — EPHEDRINE SULFATE-NACL 50-0.9 MG/10ML-% IV SOSY
PREFILLED_SYRINGE | INTRAVENOUS | Status: DC | PRN
  Administered 2017-09-26: 5 mg via INTRAVENOUS
  Administered 2017-09-26: 10 mg via INTRAVENOUS
  Administered 2017-09-26: 5 mg via INTRAVENOUS

## 2017-09-26 MED ORDER — ONDANSETRON HCL 4 MG/2ML IJ SOLN
4.0000 mg | Freq: Four times a day (QID) | INTRAMUSCULAR | Status: DC | PRN
Start: 2017-09-26 — End: 2017-09-27

## 2017-09-26 MED ORDER — PROPOFOL 10 MG/ML IV BOLUS
INTRAVENOUS | Status: DC | PRN
  Administered 2017-09-26: 170 mg via INTRAVENOUS

## 2017-09-26 MED ORDER — PROPOFOL 10 MG/ML IV BOLUS
INTRAVENOUS | Status: AC
  Filled 2017-09-26: qty 40

## 2017-09-26 MED ORDER — MIDAZOLAM HCL 2 MG/2ML IJ SOLN
INTRAMUSCULAR | Status: AC
  Filled 2017-09-26: qty 2

## 2017-09-26 MED ORDER — DOCUSATE SODIUM 100 MG PO CAPS
100.0000 mg | ORAL_CAPSULE | Freq: Two times a day (BID) | ORAL | Status: DC
  Administered 2017-09-26 – 2017-09-27 (×2): 100 mg via ORAL
  Filled 2017-09-26 (×3): qty 1

## 2017-09-26 MED ORDER — SCOPOLAMINE 1 MG/3DAYS TD PT72
1.0000 | MEDICATED_PATCH | TRANSDERMAL | Status: DC
  Administered 2017-09-26: 1 via TRANSDERMAL

## 2017-09-26 MED ORDER — SUGAMMADEX SODIUM 200 MG/2ML IV SOLN
INTRAVENOUS | Status: DC | PRN
  Administered 2017-09-26: 200 mg via INTRAVENOUS

## 2017-09-26 MED ORDER — PROPOFOL 500 MG/50ML IV EMUL
INTRAVENOUS | Status: DC | PRN
  Administered 2017-09-26: 25 ug/kg/min via INTRAVENOUS

## 2017-09-26 MED ORDER — KETOROLAC TROMETHAMINE 30 MG/ML IJ SOLN
15.0000 mg | Freq: Once | INTRAMUSCULAR | Status: AC
  Administered 2017-09-26: 15 mg via INTRAVENOUS
  Filled 2017-09-26: qty 1

## 2017-09-26 MED ORDER — ACETAMINOPHEN 10 MG/ML IV SOLN
INTRAVENOUS | Status: AC
  Filled 2017-09-26: qty 100

## 2017-09-26 MED ORDER — FENTANYL CITRATE (PF) 100 MCG/2ML IJ SOLN
INTRAMUSCULAR | Status: DC | PRN
  Administered 2017-09-26: 50 ug via INTRAVENOUS
  Administered 2017-09-26: 25 ug via INTRAVENOUS
  Administered 2017-09-26: 50 ug via INTRAVENOUS
  Administered 2017-09-26: 25 ug via INTRAVENOUS
  Administered 2017-09-26 (×2): 50 ug via INTRAVENOUS

## 2017-09-26 MED ORDER — LACTATED RINGERS IV SOLN
INTRAVENOUS | Status: DC
  Administered 2017-09-26 – 2017-09-27 (×3): via INTRAVENOUS

## 2017-09-26 MED ORDER — SENNOSIDES-DOCUSATE SODIUM 8.6-50 MG PO TABS
1.0000 | ORAL_TABLET | Freq: Every evening | ORAL | Status: DC | PRN
  Filled 2017-09-26: qty 1

## 2017-09-26 MED ORDER — ACETAMINOPHEN 10 MG/ML IV SOLN
1000.0000 mg | Freq: Once | INTRAVENOUS | Status: AC
  Administered 2017-09-26: 1000 mg via INTRAVENOUS

## 2017-09-26 MED ORDER — ROCURONIUM BROMIDE 10 MG/ML (PF) SYRINGE
PREFILLED_SYRINGE | INTRAVENOUS | Status: DC | PRN
  Administered 2017-09-26: 10 mg via INTRAVENOUS
  Administered 2017-09-26: 50 mg via INTRAVENOUS
  Administered 2017-09-26 (×2): 10 mg via INTRAVENOUS
  Administered 2017-09-26: 20 mg via INTRAVENOUS
  Administered 2017-09-26: 10 mg via INTRAVENOUS

## 2017-09-26 MED ORDER — ACETAMINOPHEN 325 MG PO TABS: 650.0000 mg | ORAL_TABLET | ORAL | Status: DC | PRN

## 2017-09-26 MED ORDER — IOPAMIDOL (ISOVUE-370) INJECTION 76%
50.0000 mL | Freq: Once | INTRAVENOUS | Status: AC | PRN
  Administered 2017-09-26: 50 mL via INTRAVENOUS

## 2017-09-26 MED ORDER — SODIUM CHLORIDE 0.9 % IV SOLN
INTRAVENOUS | Status: AC
  Filled 2017-09-26: qty 1000

## 2017-09-26 NOTE — Progress Notes (Signed)
Patient doing fairly well; has some mild right flank discomfort.  Denies nausea, vomiting or respiratory issues. Vital signs stable, afebrile Puncture site right flank clean, dry, soft, mildly tender to palpation.  Foley with tea colored urine.  Assessment/plan: s/p cryoablation of right upper pole renal mass earlier today; for overnight observation; check a.m. Labs; Tylenol and/or Toradol as needed for pain; follow-up with Dr. Anselm Pancoast in the IR clinic in 3-4 weeks.   Rowe Robert, [facility]

## 2017-09-26 NOTE — H&P (Signed)
Referring Physician(s): JJOACZ,Y  Supervising Physician: Markus Daft  Patient Status:  [facility] TBA  Chief Complaint:  Right renal mass  Subjective: Patient familiar to our service from recent consultation with Dr. Anselm Pancoast on 09/10/17 to discuss treatment options for newly noted enlarging right renal mass.  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  She has also undergone right lung metastasis resection in 2009.  She now presents with a new 2.6 cm hypervascular mass in the upper pole of the right kidney concerning for renal cell carcinoma.  She presents today for CT-guided cryoablation/possible biopsy of the mass.  She currently denies fever, headache, chest pain, dyspnea, cough, abdominal pain, nausea, vomiting, dysuria or abnormal bleeding. Past Medical History:  Diagnosis Date  . Cancer ([place]) [id number]   kidney, lung  . Gallbladder sludge   . History of hiatal hernia   . Hyperlipidemia   . PONV (postoperative nausea and vomiting)   . Right renal mass    Past Surgical History:  Procedure Laterality Date  . BLADDER SUSPENSION  july 2011 and nov 2011  . COLONOSCOPY    . [place]  . INCISIONAL HERNIA REPAIR N/A 10/04/2015   Procedure: LAPAROSCOPIC INCISIONAL HERNIA;  Surgeon: Ralene Ok, MD;  Location: [facility];  Service: General;  Laterality: N/A;  . INSERTION OF MESH N/A 10/04/2015   Procedure: WITH INSERTION OF MESH;  Surgeon: Ralene Ok, MD;  Location: [facility];  Service: General;  Laterality: N/A;  . IR RADIOLOGIST EVAL & MGMT  09/10/2017  . LAPAROSCOPIC CHOLECYSTECTOMY  05/29/11  . LOBECTOMY  2009   right lower lung  . NEPHRECTOMY  october 2006   left kidney  . [place]  2005  . POLYPECTOMY  1985   vocal cord  . RHINOPLASTY  1985  . SPLENECTOMY, TOTAL  october 2006  . TUBAL LIGATION  1983  . TUMOR REMOVAL  1992   ovarian durmoid tumors bilateral       Allergies: Oxycodone-acetaminophen  Medications: Prior to Admission medications   Not on File     Vital Signs: Blood pressure 121/72, heart rate 96, respirations 18, temperature 97.6, O2 sat 100% room air    Physical Exam awake, alert.  Chest clear to auscultation bilaterally.  Heart with regular rate and rhythm.  Abdomen soft, positive bowel sounds, nontender.  No lower extremity edema.  Imaging: No results found.  Labs:  CBC: Recent Labs    10/02/16 0911 08/08/17 1122 09/23/17 1346  WBC 5.5 6.7 7.4  HGB 13.7 13.5 13.9  HCT 41.1 42.1 42.8  PLT 302 319 368    COAGS: Recent Labs    09/23/17 1346  INR 0.99    BMP: Recent Labs    10/02/16 0911 08/08/17 1122 09/23/17 1346  NA 143 140 137  K 4.5 4.5 4.5  CL 105  --  104  CO2 24 23 25   GLUCOSE 109* 109 95  BUN 10 13.8 17  CALCIUM 9.9 10.0 10.3  CREATININE 1.03* 1.0 0.91  GFRNONAA 58*  --  >60  GFRAA 67  --  >60    LIVER FUNCTION TESTS: Recent Labs    10/02/16 0911 08/08/17 1122  BILITOT 0.7 0.48  AST 21 19  ALT 19 19  ALKPHOS 62 74  PROT 6.9 7.8  ALBUMIN 4.4 4.1    Assessment and Plan: Patient with prior history of left renal cell carcinoma 2006, status post nephrectomy,  right lung metastasis resection in 2009; now with new enlarging 2.6 cm right upper pole renal mass concerning for renal cell carcinoma; seen in consultation by Dr. Anselm Pancoast on 09/10/17 and deemed an appropriate candidate for CT-guided cryoablation/possible biopsy of the right renal mass.  She presents today for the procedure.  Details/risks of procedure, including but not limited to, internal bleeding, infection, injury to adjacent structures, compromised renal function, anesthesia related complications discussed with patient/husband with their understanding and consent.  This procedure involves the use of X-rays and because of the nature of the planned procedure, it is possible that we will have prolonged use of CT.  Potential  radiation risks to you include (but are not limited to) the following: - A slightly elevated risk for cancer  several years later in life. This risk is typically less than 0.5% percent. This risk is low in comparison to the normal incidence of human cancer, which is 33% for women and 50% for men according to the [place]. - Radiation induced injury can include skin redness, resembling a rash, tissue breakdown / ulcers and hair loss (which can be temporary or permanent).   The likelihood of either of these occurring depends on the difficulty of the procedure and whether you are sensitive to radiation due to previous procedures, disease, or genetic conditions.   IF your procedure requires a prolonged use of radiation, you will be notified and given written instructions for further action.  It is your responsibility to monitor the irradiated area for the 2 weeks following the procedure and to notify your physician if you are concerned that you have suffered a radiation induced injury.    Post procedure she will be admitted to the hospital overnight for observation.   Electronically Signed: D. Rowe Robert, PA-C 09/26/2017, 10:45 AM   I spent a total of 30 minutes at the the patient's bedside AND on the patient's hospital floor or unit, greater than 50% of which was counseling/coordinating care for CT-guided cryoablation/possible biopsy of right renal mass

## 2017-09-26 NOTE — Procedures (Signed)
  Pre-operative Diagnosis: Right renal lesion, most compatible with renal cell carcinoma       Post-operative Diagnosis: Right renal lesion, most compatible with renal cell carcinoma   Indications: New right renal lesion and prior left nephrectomy for RCC  Procedure: CT guided cryoablation of right renal upper pole lesion  Physicians:  Anselm Pancoast and Wagner  Findings: Enhancing lesion in right kidney upper pole.  Lesion treated with two IceForce cryoablation needles.  Hydro dissection between right kidney and liver.  10 minutes freeze; 8 minute thaw followed by another 10 minute freeze and 5 minute thaw.  Cautery performed in both needles prior to removal.   Complications: Minimal perinephric bleeding during needle placement.      EBL: Minimal  Plan: PACU for recovery and then overnight observation.

## 2017-09-26 NOTE — Sedation Documentation (Signed)
Anesthesia in to monitor and sedate. 

## 2017-09-26 NOTE — Anesthesia Postprocedure Evaluation (Signed)
Anesthesia Post Note  Patient: Jenny Soto Hem  Procedure(s) Performed: CT RENAL CRYOABLATION (Right )     Patient location during evaluation: PACU Anesthesia Type: General Level of consciousness: awake and alert Pain management: pain level controlled Vital Signs Assessment: post-procedure vital signs reviewed and stable Respiratory status: spontaneous breathing, nonlabored ventilation, respiratory function stable and patient connected to nasal cannula oxygen Cardiovascular status: blood pressure returned to baseline and stable Postop Assessment: no apparent nausea or vomiting Anesthetic complications: no    Last Vitals:  Vitals:   09/26/17 1645 09/26/17 1657  BP:  134/80  Pulse:  88  Resp:  15  Temp: 36.7 C 36.6 C  SpO2:  98%    Last Pain:  Vitals:   09/26/17 1657  TempSrc: Oral  PainSc:                  Kina Shiffman

## 2017-09-26 NOTE — Anesthesia Preprocedure Evaluation (Signed)
Anesthesia Evaluation  Patient identified by MRN, date of birth, ID band Patient awake    Reviewed: Allergy & Precautions, NPO status , Patient's Chart, lab work & pertinent test results  History of Anesthesia Complications (+) PONV and history of anesthetic complications  Airway Mallampati: II  TM Distance: >3 FB Neck ROM: Full    Dental  (+) Teeth Intact, Implants   Pulmonary neg shortness of breath, neg COPD, neg recent URI, former smoker,    breath sounds clear to auscultation       Cardiovascular negative cardio ROS   Rhythm:Regular     Neuro/Psych negative neurological ROS  negative psych ROS   GI/Hepatic Neg liver ROS, hiatal hernia,   Endo/Other  negative endocrine ROS  Renal/GU Renal diseaseRight renal mass     Musculoskeletal   Abdominal   Peds  Hematology negative hematology ROS (+)   Anesthesia Other Findings   Reproductive/Obstetrics                             Anesthesia Physical Anesthesia Plan  ASA: II  Anesthesia Plan: General   Post-op Pain Management:    Induction: Intravenous  PONV Risk Score and Plan: 4 or greater and Ondansetron, Dexamethasone, Treatment may vary due to age or medical condition, Propofol infusion and TIVA  Airway Management Planned: Oral ETT  Additional Equipment: None  Intra-op Plan:   Post-operative Plan: Extubation in OR  Informed Consent: I have reviewed the patients History and Physical, chart, labs and discussed the procedure including the risks, benefits and alternatives for the proposed anesthesia with the patient or authorized representative who has indicated his/her understanding and acceptance.   Dental advisory given  Plan Discussed with: CRNA and Surgeon  Anesthesia Plan Comments:         Anesthesia Quick Evaluation

## 2017-09-26 NOTE — Anesthesia Procedure Notes (Signed)
Performed by: Latona Krichbaum D, CRNA       

## 2017-09-26 NOTE — Transfer of Care (Signed)
Immediate Anesthesia Transfer of Care Note  Patient: Jenny Soto  Procedure(s) Performed: CT RENAL CRYOABLATION (Right )  Patient Location: PACU  Anesthesia Type:General  Level of Consciousness: awake, alert  and oriented  Airway & Oxygen Therapy: Patient Spontanous Breathing and Patient connected to face mask oxygen  Post-op Assessment: Report given to RN  Post vital signs: Reviewed and stable  Last Vitals: There were no vitals filed for this visit.  Last Pain: There were no vitals filed for this visit.       Complications: No apparent anesthesia complications

## 2017-09-26 NOTE — Anesthesia Procedure Notes (Signed)
Procedure Name: Intubation Date/Time: 09/26/2017 12:14 PM Performed by: Zanna Hawn D, CRNA Pre-anesthesia Checklist: Patient identified, Emergency Drugs available, Suction available and Patient being monitored Patient Re-evaluated:Patient Re-evaluated prior to induction Oxygen Delivery Method: Circle system utilized Preoxygenation: Pre-oxygenation with 100% oxygen Induction Type: IV induction Ventilation: Mask ventilation without difficulty Laryngoscope Size: Mac and 4 Grade View: Grade I Tube type: Oral Tube size: 7.5 mm Number of attempts: 1 Airway Equipment and Method: Stylet Placement Confirmation: ETT inserted through vocal cords under direct vision,  positive ETCO2 and breath sounds checked- equal and bilateral Secured at: 21 cm Tube secured with: Tape Dental Injury: Teeth and Oropharynx as per pre-operative assessment

## 2017-09-27 DIAGNOSIS — N2889 Other specified disorders of kidney and ureter: Secondary | ICD-10-CM | POA: Diagnosis not present

## 2017-09-27 LAB — CBC
HCT: 36.1 % (ref 36.0–46.0)
HEMOGLOBIN: 11.9 g/dL — AB (ref 12.0–15.0)
MCH: 32.5 pg (ref 26.0–34.0)
MCHC: 33 g/dL (ref 30.0–36.0)
MCV: 98.6 fL (ref 78.0–100.0)
PLATELETS: 287 10*3/uL (ref 150–400)
RBC: 3.66 MIL/uL — AB (ref 3.87–5.11)
RDW: 13.4 % (ref 11.5–15.5)
WBC: 13.9 10*3/uL — AB (ref 4.0–10.5)

## 2017-09-27 LAB — BASIC METABOLIC PANEL
Anion gap: 7 (ref 5–15)
BUN: 16 mg/dL (ref 6–20)
CHLORIDE: 103 mmol/L (ref 101–111)
CO2: 25 mmol/L (ref 22–32)
CREATININE: 0.9 mg/dL (ref 0.44–1.00)
Calcium: 9.4 mg/dL (ref 8.9–10.3)
GFR calc non Af Amer: >60 mL/min (ref >60)
Glucose, Bld: 150 mg/dL — ABNORMAL HIGH (ref 65–99)
Potassium: 4.6 mmol/L (ref 3.5–5.1)
SODIUM: 135 mmol/L (ref 135–145)

## 2017-09-27 NOTE — Progress Notes (Signed)
Nurse reviewed discharge instructions with pt.  Pt verbalized understanding of discharge instructions and follow up appointment.  Bandaid placed over right flank incision prior to discharge per order.  No concerns at time of discharge.

## 2017-09-27 NOTE — Discharge Summary (Signed)
Patient ID: SRI CLEGG MRN: [id number] DOB/AGE: 65-Mar-1952 65 y.o.  Admit date: 09/26/2017 Discharge date: 09/27/2017  Supervising Physician: Sandi Mariscal  Patient Status: [facility] - In-pt  Admission Diagnoses:   Right renal mass Cryoablation in IR 2/1  Discharge Diagnoses:  Active Problems:   Renal cell carcinoma of right kidney [facility])   Discharged Condition: improved  Hospital Course: Rt renal mass; cryoablation in IR with Dr Anselm Pancoast 09/26/17. No biopsy performed per pt request Tolerated well Overnight stay was uneventful. Denies pain; eating well Denies N/V Urinating well---clear yellow No BM but passing gas Ambulating in hall and room I have seen and examined pt--- reported status to Dr Pascal Lux Plan now for discharge home  Pt has hx renal and lung cancer-- previous surgery for both Left nephrectomy and right lobectomy  Consults: None  Significant Diagnostic Studies: CT  Treatments: CT guided cryoablation of right renal upper pole lesion Findings: Enhancing lesion in right kidney upper pole.  Lesion treated with two IceForce cryoablation needles.  Hydro dissection between right kidney and liver.  10 minutes freeze; 8 minute thaw followed by another 10 minute freeze and 5 minute thaw.  Cautery performed in both needles prior to removal.   Discharge Exam: Blood pressure 101/66, pulse (!) 58, temperature 97.8 F (36.6 C), temperature source Oral, resp. rate 18, height 5\' 5"  (1.651 m), weight 159 lb 6.3 oz (72.3 kg), SpO2 99 %.  A/O Appropriate [place] RRR Lungs CTA Abd soft; +BS; NT No masses Rt flank: skin site is clean and dry NT no bleeding No hematoma Extr: FROM Urine; clear yellow = good output  Results for orders placed or performed during the hospital encounter of 65/85/63  Basic metabolic panel  Result Value Ref Range   Sodium 135 135 - 145 mmol/L   Potassium 4.6 3.5 - 5.1 mmol/L   Chloride 103 101 - 111 mmol/L   CO2 25 22 - 32 mmol/L   Glucose,  Bld 150 (H) 65 - 99 mg/dL   BUN 16 6 - 20 mg/dL   Creatinine, Ser 0.90 0.44 - 1.00 mg/dL   Calcium 9.4 8.9 - 10.3 mg/dL   GFR calc non Af Amer >60 >60 mL/min   GFR calc Af Amer >60 >60 mL/min   Anion gap 7 5 - 15  CBC  Result Value Ref Range   WBC 13.9 (H) 4.0 - 10.5 K/uL   RBC 3.66 (L) 3.87 - 5.11 MIL/uL   Hemoglobin 11.9 (L) 12.0 - 15.0 g/dL   HCT 36.1 36.0 - 46.0 %   MCV 98.6 78.0 - 100.0 fL   MCH 32.5 26.0 - 34.0 pg   MCHC 33.0 30.0 - 36.0 g/dL   RDW 13.4 11.5 - 15.5 %   Platelets 287 150 - 400 K/uL     Disposition: Rt renal cryoablation in IR 09/26/17 Doing well Plan now for discharge home Tylenol as needed for pain Follow up with Dr Anselm Pancoast 4 weeks-- pt will hear from scheduler for time and date She is aware of plan Has good understanding of DC instructions Tylenol po at home as needed  Discharge Instructions    Call MD for:  difficulty breathing, headache or visual disturbances   Complete by:  As directed    Call MD for:  extreme fatigue   Complete by:  As directed    Call MD for:  hives   Complete by:  As directed    Call MD for:  persistant dizziness or light-headedness  Complete by:  As directed    Call MD for:  persistant nausea and vomiting   Complete by:  As directed    Call MD for:  redness, tenderness, or signs of infection (pain, swelling, redness, odor or green/yellow discharge around incision site)   Complete by:  As directed    Call MD for:  severe uncontrolled pain   Complete by:  As directed    Call MD for:  temperature >100.4   Complete by:  As directed    Diet - low sodium heart healthy   Complete by:  As directed    Discharge instructions   Complete by:  As directed    Restful x 3 days; no lifting over 10 lbs x 3 days; follow up Dr Anselm Pancoast 3-4 weeks---pt will hear from scheduler for that appt; call [phone] if any needs   Discharge wound care:   Complete by:  As directed    Keep clean bandaid on rt flank site daily x 1 week   Driving  Restrictions   Complete by:  As directed    No driving x 3 days   Increase activity slowly   Complete by:  As directed    Lifting restrictions   Complete by:  As directed    No lifting over 10 lbs x 3 days     Allergies as of 09/27/2017      Reactions   Oxycodone-acetaminophen Nausea And Vomiting      Medication List    You have not been prescribed any medications.          Discharge Care Instructions  (From admission, onward)        Start     Ordered   09/27/17 0000  Discharge wound care:    Comments:  Keep clean bandaid on rt flank site daily x 1 week   09/27/17 1232     Follow-up Information    Markus Daft, MD Follow up in 4 week(s).   Specialties:  Interventional Radiology, Radiology Why:  follow up with Dr Anselm Pancoast 4 weeks-- pt will hear from scheduler for time and date; call [phone] if any needs Contact information: [address] [place] [address] [phone]            Electronically Signed: Monia Sabal A, PA-C 09/27/2017, 12:43 PM   I have spent Greater Than 30 Minutes discharging [place].

## 2017-09-29 ENCOUNTER — Encounter (HOSPITAL_COMMUNITY): Payer: Self-pay | Admitting: Diagnostic Radiology

## 2017-10-01 ENCOUNTER — Other Ambulatory Visit (HOSPITAL_COMMUNITY): Payer: Self-pay | Admitting: Diagnostic Radiology

## 2017-10-01 DIAGNOSIS — C641 Malignant neoplasm of right kidney, except renal pelvis: Secondary | ICD-10-CM

## 2017-10-28 ENCOUNTER — Ambulatory Visit
Admission: RE | Admit: 2017-10-28 | Discharge: 2017-10-28 | Disposition: A | Discharge status: Home / Self Care | Payer: BC Managed Care – PPO | Source: Ambulatory Visit | Attending: Diagnostic Radiology | Admitting: Diagnostic Radiology

## 2017-10-28 ENCOUNTER — Encounter: Payer: Self-pay | Admitting: *Deleted

## 2017-10-28 DIAGNOSIS — C641 Malignant neoplasm of right kidney, except renal pelvis: Secondary | ICD-10-CM

## 2017-10-28 HISTORY — PX: IR RADIOLOGIST EVAL & MGMT: IMG5224

## 2017-10-28 NOTE — Progress Notes (Addendum)
Chief Complaint: Patient was seen in consultation today for  Chief Complaint  Patient presents with  . Follow-up    1 mo follow up Cryoablation of Right Renal Cell Carcinoma   at the request of Axell Trigueros  Referring Physician(s): Zola Button  History of Present Illness: Jenny Soto is a 67 y.o. female with history of recurrent renal cell carcinoma. She had a large left renal mass resected in 2006 and resection of a solitary metastatic lung lesion in 2009. Surveillance CT demonstrated a new enhancing lesion in the right upper pole. This new right upper pole lesion was treated with CT-guided cryoablation on 09/26/2017. Patient presents for one-month follow-up after the ablation. Patient is asymptomatic. She denies hematuria, dysuria or flank pain. She denies chest pain, shortness of breath or GI problems. The postprocedure hospital course was unremarkable. She had mild hematuria immediately after the procedure that was self-limiting.  Past Medical History:  Diagnosis Date  . Cancer ([place]) [id number]   kidney, lung  . Gallbladder sludge   . History of hiatal hernia   . Hyperlipidemia   . PONV (postoperative nausea and vomiting)   . Right renal mass     Past Surgical History:  Procedure Laterality Date  . BLADDER SUSPENSION  july 2011 and nov 2011  . COLONOSCOPY    . [place]  . INCISIONAL HERNIA REPAIR N/A 10/04/2015   Procedure: LAPAROSCOPIC INCISIONAL HERNIA;  Surgeon: Ralene Ok, MD;  Location: [facility];  Service: General;  Laterality: N/A;  . INSERTION OF MESH N/A 10/04/2015   Procedure: WITH INSERTION OF MESH;  Surgeon: Ralene Ok, MD;  Location: [facility];  Service: General;  Laterality: N/A;  . IR RADIOLOGIST EVAL & MGMT  09/10/2017  . LAPAROSCOPIC CHOLECYSTECTOMY  05/29/11  . LOBECTOMY  2009   right lower lung  . NEPHRECTOMY  october 2006   left kidney  . [place]  2005  . POLYPECTOMY  1985   vocal cord  . RADIOFREQUENCY ABLATION  Right 09/26/2017   Procedure: CT RENAL CRYOABLATION;  Surgeon: Markus Daft, MD;  Location: [facility];  Service: Anesthesiology;  Laterality: Right;  . RHINOPLASTY  1985  . SPLENECTOMY, TOTAL  october 2006  . TUBAL LIGATION  1983  . TUMOR REMOVAL  1992   ovarian durmoid tumors bilateral    Allergies: Oxycodone-acetaminophen  Medications: Prior to Admission medications   Not on File     Family History  Adopted: Yes  Problem Relation Age of Onset  . Heart disease Mother   . Colon cancer Neg Hx     Social History   Socioeconomic History  . Marital status: Married    Spouse name: Not on file  . Number of children: Not on file  . Years of education: Not on file  . Highest education level: Not on file  Social Needs  . Financial resource strain: Not on file  . Food insecurity - worry: Not on file  . Food insecurity - inability: Not on file  . Transportation needs - medical: Not on file  . Transportation needs - non-medical: Not on file  Occupational History  . Not on file  Tobacco Use  . Smoking status: Former Smoker    Packs/day: 0.50    Years: 20.00    Pack years: 10.00    Types: Cigarettes    Last attempt to quit: 08/26/2004    Years since quitting: 13.1  . Smokeless tobacco: Never Used  Substance and Sexual Activity  .  Alcohol use: Yes    Alcohol/week: 3.6 oz    Types: 3 Glasses of wine, 3 Cans of beer per week    Comment: wine or beer every night  . Drug use: No  . Sexual activity: Yes    Birth control/protection: Surgical  Other Topics Concern  . Not on file  Social History Narrative  . Not on file      Review of Systems  Constitutional: Negative.   Respiratory: Negative.   Cardiovascular: Negative.   Gastrointestinal: Negative.   Genitourinary: Negative.     Vital Signs: BP 110/72   Pulse 75   Temp 98 F (36.7 C) (Oral)   Resp 14   Ht 5\' 4"  (1.626 m)   Wt 148 lb (67.1 kg)   SpO2 100%   BMI 25.40 kg/m   Physical Exam  Constitutional: No  distress.  Cardiovascular: Normal rate, regular rhythm and normal heart sounds.  Pulmonary/Chest: Effort normal and breath sounds normal.  Abdominal: Soft. Bowel sounds are normal. There is no tenderness.  Small ablation incisions have healed. No erythema at the right flank.    Imaging: No results found.  Labs:  CBC: Recent Labs    08/08/17 1122 09/23/17 1346 09/27/17 0533  WBC 6.7 7.4 13.9*  HGB 13.5 13.9 11.9*  HCT 42.1 42.8 36.1  PLT 319 368 287    COAGS: Recent Labs    09/23/17 1346  INR 0.99    BMP: Recent Labs    08/08/17 1122 09/23/17 1346 09/27/17 0533  NA 140 137 135  K 4.5 4.5 4.6  CL  --  104 103  CO2 23 25 25   GLUCOSE 109 95 150*  BUN 13.8 17 16   CALCIUM 10.0 10.3 9.4  CREATININE 1.0 0.91 0.90  GFRNONAA  --  >60 >60  GFRAA  --  >60 >60    LIVER FUNCTION TESTS: Recent Labs    08/08/17 1122  BILITOT 0.48  AST 19  ALT 19  ALKPHOS 74  PROT 7.8  ALBUMIN 4.1    TUMOR MARKERS: No results for input(s): AFPTM, CEA, CA199, CHROMGRNA in the last 8760 hours.  Assessment and Plan:  67 year old with history of renal cell carcinoma and recently treated recurrence with cryoablation. Patient tolerated the CT-guided cryoablation of the right renal lesion very well. She is currently asymptomatic. Her renal function was normal one day after the procedure. I reviewed the procedure images with the patient and her husband.  We discussed the follow-up plan. Plan for follow-up MRI of the kidneys with and without contrast in 2 months. Depending on the results of that study, I will coordinate with Dr. Alen Blew for surveillance of her renal cell carcinoma.    Thank you for this interesting consult.  I greatly enjoyed meeting Jenny Soto and look forward to participating in their care.  A copy of this report was sent to the requesting provider on this date.  Electronically Signed: Burman Riis 10/28/2017, 12:20 PM   I spent a total of    15 Minutes in face to  face in clinical consultation, greater than 50% of which was counseling/coordinating care for renal cell carcinoma.  Patient ID: Jenny Soto, female   DOB: 04/19/1951, 67 y.o.   MRN: [id number]

## 2017-12-23 ENCOUNTER — Other Ambulatory Visit (HOSPITAL_COMMUNITY): Payer: Self-pay | Admitting: Diagnostic Radiology

## 2017-12-23 DIAGNOSIS — C641 Malignant neoplasm of right kidney, except renal pelvis: Secondary | ICD-10-CM

## 2017-12-29 ENCOUNTER — Other Ambulatory Visit: Payer: Self-pay | Admitting: *Deleted

## 2017-12-29 DIAGNOSIS — C641 Malignant neoplasm of right kidney, except renal pelvis: Secondary | ICD-10-CM

## 2018-01-27 ENCOUNTER — Encounter: Payer: Self-pay | Admitting: Radiology

## 2018-01-27 ENCOUNTER — Ambulatory Visit (HOSPITAL_COMMUNITY)
Admission: RE | Admit: 2018-01-27 | Discharge: 2018-01-27 | Disposition: A | Discharge status: Home / Self Care | Payer: BC Managed Care – PPO | Source: Ambulatory Visit | Attending: Diagnostic Radiology | Admitting: Diagnostic Radiology

## 2018-01-27 ENCOUNTER — Ambulatory Visit
Admission: RE | Admit: 2018-01-27 | Discharge: 2018-01-27 | Disposition: A | Discharge status: Home / Self Care | Payer: BC Managed Care – PPO | Source: Ambulatory Visit | Attending: Diagnostic Radiology | Admitting: Diagnostic Radiology

## 2018-01-27 DIAGNOSIS — Z905 Acquired absence of kidney: Secondary | ICD-10-CM | POA: Diagnosis not present

## 2018-01-27 DIAGNOSIS — C641 Malignant neoplasm of right kidney, except renal pelvis: Secondary | ICD-10-CM | POA: Diagnosis not present

## 2018-01-27 DIAGNOSIS — Z9081 Acquired absence of spleen: Secondary | ICD-10-CM | POA: Diagnosis not present

## 2018-01-27 DIAGNOSIS — R590 Localized enlarged lymph nodes: Secondary | ICD-10-CM | POA: Insufficient documentation

## 2018-01-27 HISTORY — PX: IR RADIOLOGIST EVAL & MGMT: IMG5224

## 2018-01-27 LAB — POCT I-STAT CREATININE: Creatinine, Ser: 1 mg/dL (ref 0.44–1.00)

## 2018-01-27 MED ORDER — GADOBENATE DIMEGLUMINE 529 MG/ML IV SOLN
15.0000 mL | Freq: Once | INTRAVENOUS | Status: AC | PRN
  Administered 2018-01-27: 14 mL via INTRAVENOUS

## 2018-01-27 NOTE — Progress Notes (Signed)
Chief Complaint: Follow-up right renal lesion cryoablation.  Referring Physician(s): Zola Button  History of Present Illness: Jenny Soto is a 67 y.o. female with history of metastatic renal cell carcinoma.  Large left renal mass was resected in 2006 and the solitary metastatic lung lesion was removed in 2009.  Earlier this year, patient was found to have a new lesion involving the upper pole of the solitary right kidney.  There was no other evidence for metastatic disease at that time.  Patient underwent CT-guided cryoablation of the right renal lesion on 09/26/2017.  The procedure and post procedure care was uneventful.  Patient returned to her regular activities soon after the procedure.  She has occasional mild discomfort in the right lateral abdominal region and not clear if it is related to a pulled muscle or post procedure changes.  This is not a constant area of discomfort or pain.  She denies any dysuria or hematuria.  Creatinine was 1.0 today.  Patient had an MRI earlier today.  Past Medical History:  Diagnosis Date  . Cancer ([place]) [id number]   kidney, lung  . Gallbladder sludge   . History of hiatal hernia   . Hyperlipidemia   . PONV (postoperative nausea and vomiting)   . Right renal mass     Past Surgical History:  Procedure Laterality Date  . BLADDER SUSPENSION  july 2011 and nov 2011  . COLONOSCOPY    . [place]  . INCISIONAL HERNIA REPAIR N/A 10/04/2015   Procedure: LAPAROSCOPIC INCISIONAL HERNIA;  Surgeon: Ralene Ok, MD;  Location: [place];  Service: General;  Laterality: N/A;  . INSERTION OF MESH N/A 10/04/2015   Procedure: WITH INSERTION OF MESH;  Surgeon: Ralene Ok, MD;  Location: [place];  Service: General;  Laterality: N/A;  . IR RADIOLOGIST EVAL & MGMT  09/10/2017  . IR RADIOLOGIST EVAL & MGMT  10/28/2017  . LAPAROSCOPIC CHOLECYSTECTOMY  05/29/11  . LOBECTOMY  2009   right lower lung  . NEPHRECTOMY  october 2006   left kidney   . [place]  2005  . POLYPECTOMY  1985   vocal cord  . RADIOFREQUENCY ABLATION Right 09/26/2017   Procedure: CT RENAL CRYOABLATION;  Surgeon: Markus Daft, MD;  Location[facility];  Service: Anesthesiology;  Laterality: Right;  . RHINOPLASTY  1985  . SPLENECTOMY, TOTAL  october 2006  . TUBAL LIGATION  1983  . TUMOR REMOVAL  1992   ovarian durmoid tumors bilateral    Allergies: Oxycodone-acetaminophen  Medications: Prior to Admission medications   Not on File     Family History  Adopted: Yes  Problem Relation Age of Onset  . Heart disease Mother   . Colon cancer Neg Hx     Social History   Socioeconomic History  . Marital status: Married    Spouse name: Not on file  . Number of children: Not on file  . Years of education: Not on file  . Highest education level: Not on file  Occupational History  . Not on file  Social Needs  . Financial resource strain: Not on file  . Food insecurity:    Worry: Not on file    Inability: Not on file  . Transportation needs:    Medical: Not on file    Non-medical: Not on file  Tobacco Use  . Smoking status: Former Smoker    Packs/day: 0.50    Years: 20.00    Pack years: 10.00  Types: Cigarettes    Last attempt to quit: 08/26/2004    Years since quitting: 13.4  . Smokeless tobacco: Never Used  Substance and Sexual Activity  . Alcohol use: Yes    Alcohol/week: 3.6 oz    Types: 3 Glasses of wine, 3 Cans of beer per week    Comment: wine or beer every night  . Drug use: No  . Sexual activity: Yes    Birth control/protection: Surgical  Lifestyle  . Physical activity:    Days per week: Not on file    Minutes per session: Not on file  . Stress: Not on file  Relationships  . Social connections:    Talks on phone: Not on file    Gets together: Not on file    Attends religious service: Not on file    Active member of club or organization: Not on file    Attends meetings of clubs or organizations: Not on file     Relationship status: Not on file  Other Topics Concern  . Not on file  Social History Narrative  . Not on file    ECOG Status: 0 - Asymptomatic .  Review of Systems  Constitutional: Negative.   Gastrointestinal: Positive for abdominal pain.       Occasional right abdominal pain.  Patient believes this is musculoskeletal in etiology.  Genitourinary: Negative.     Vital Signs: BP (!) 145/74   Pulse 73   Temp 97.8 F (36.6 C) (Oral)   Resp 14   SpO2 99%   Physical Exam  Constitutional: No distress.  Cardiovascular: Normal rate, regular rhythm and normal heart sounds.  Pulmonary/Chest: Effort normal and breath sounds normal.  Abdominal: Soft. Bowel sounds are normal.  Ablation site is well-healed.  No abnormalities at the percutaneous ablation site.         Imaging: Mr Abdomen Wwo Contrast  Result Date: 01/27/2018 CLINICAL DATA:  Status post right renal cryoablation on 09/26/2016, history of left nephrectomy EXAM: MRI ABDOMEN WITHOUT AND WITH CONTRAST TECHNIQUE: Multiplanar multisequence MR imaging of the abdomen was performed both before and after the administration of intravenous contrast. CONTRAST:  24mL MULTIHANCE GADOBENATE DIMEGLUMINE 529 MG/ML IV SOLN COMPARISON:  CT chest abdomen pelvis dated 09/03/2016 FINDINGS: Lower chest: Lung bases are clear. Hepatobiliary: Small left hepatic cysts measuring up to 6 mm. Status post cholecystectomy. No intrahepatic or extrahepatic ductal dilatation. Pancreas:  Within normal limits. Spleen: Status post splenectomy with splenule in the posterior left upper abdomen (series 4/image 19). Adrenals/Urinary Tract:  Adrenal glands are within normal limits. Status post left nephrectomy. No abnormal soft tissue in the surgical bed. Status post medial right upper pole renal cryoablation. Renal cryoablation zone measures 2.1 x 3.0 cm (series 4/image 18). No residual enhancement on postcontrast subtraction imaging to suggest viable tumor. 11 mm cyst  in the medial right upper kidney (series 4/image 20). Additional subcentimeter cyst in the anterior right lower kidney (series 4/image 36). No hydronephrosis. Stomach/Bowel: Stomach and visualized bowel are grossly unremarkable. Vascular/Lymphatic:  No evidence of abdominal aortic aneurysm. 10 mm node posterior to the right renal vein (series 4/image 26), previously 4 mm, suspicious. Other:  No abdominal ascites. Musculoskeletal: No focal osseous lesions. IMPRESSION: Status post medial right upper pole renal cryoablation, without residual enhancement to suggest viable tumor. 10 mm node posterior to the right renal vein, increased, worrisome for nodal metastasis. Status post left nephrectomy and splenectomy. Electronically Signed   By: Julian Hy M.D.   On: 01/27/2018  09:18    Labs:  CBC: Recent Labs    08/08/17 1122 09/23/17 1346 09/27/17 0533  WBC 6.7 7.4 13.9*  HGB 13.5 13.9 11.9*  HCT 42.1 42.8 36.1  PLT 319 368 287    COAGS: Recent Labs    09/23/17 1346  INR 0.99    BMP: Recent Labs    08/08/17 1122 09/23/17 1346 09/27/17 0533 01/27/18 0838  NA 140 137 135  --   K 4.5 4.5 4.6  --   CL  --  104 103  --   CO2 23 25 25   --   GLUCOSE 109 95 150*  --   BUN 13.8 17 16   --   CALCIUM 10.0 10.3 9.4  --   CREATININE 1.0 0.91 0.90 1.00  GFRNONAA  --  >60 >60  --   GFRAA  --  >60 >60  --     LIVER FUNCTION TESTS: Recent Labs    08/08/17 1122  BILITOT 0.48  AST 19  ALT 19  ALKPHOS 74  PROT 7.8  ALBUMIN 4.1    TUMOR MARKERS: No results for input(s): AFPTM, CEA, CA199, CHROMGRNA in the last 8760 hours.  Assessment and Plan:  66 year old with history of metastatic renal cell carcinoma and recently treated right renal lesion with cryoablation.  Patient is doing very well following the cryoablation.  However, MRI today suggests there may be a small nodal metastasis in the right retroperitoneum.  The right renal ablation site looks good without evidence of residual  tumor or suspicious enhancement.  I discussed these MRI findings with the patient and her husband.  They were understandably disappointed about the abnormal lymph node.  It is possible that this lymph node is reactive from the recent procedure but I am concerned about metastatic nodal disease based on her history.  Plan to discuss with Dr. Alen Blew with regards to additional imaging and/or percutaneous sampling of this retroperitoneal lymph node.  The lymph node is small but should be amenable to CT-guided biopsy.  Patient is planning to go to the beach for most of the summer.  However, she will be available to come back to [place] as needed.  Will contact the patient after discussing the recent MRI with Dr. Alen Blew.  Electronically Signed: Burman Riis 01/27/2018, 12:11 PM   I spent a total of    25 Minutes in face to face in clinical consultation, greater than 50% of which was counseling/coordinating care for renal cell carcinoma.  Patient ID: Jenny Soto, female   DOB: 1951/04/30, 67 y.o.   MRN: [id number]

## 2018-01-28 ENCOUNTER — Encounter: Payer: Self-pay | Admitting: *Deleted

## 2018-01-28 ENCOUNTER — Other Ambulatory Visit: Payer: Self-pay | Admitting: *Deleted

## 2018-01-28 ENCOUNTER — Telehealth: Payer: Self-pay | Admitting: *Deleted

## 2018-01-28 ENCOUNTER — Encounter: Payer: Self-pay | Admitting: Oncology

## 2018-01-28 ENCOUNTER — Telehealth: Payer: Self-pay

## 2018-01-28 ENCOUNTER — Telehealth: Payer: Self-pay | Admitting: Oncology

## 2018-01-28 ENCOUNTER — Other Ambulatory Visit: Payer: Self-pay | Admitting: Oncology

## 2018-01-28 DIAGNOSIS — C649 Malignant neoplasm of unspecified kidney, except renal pelvis: Secondary | ICD-10-CM

## 2018-01-28 NOTE — Progress Notes (Signed)
Lab/ ct set up for  01/30/18. Patient aware of day and time.

## 2018-01-28 NOTE — Telephone Encounter (Signed)
Scheduled appt per 6/4 sch message - pt is aware of appt date and time.  

## 2018-01-28 NOTE — Telephone Encounter (Signed)
Per 6/5 sch msg to add lab to ct scheduled appointment date. Spoke with patient and she is aware of this appointment time

## 2018-01-28 NOTE — Telephone Encounter (Signed)
Patient calling to ask if scan has been set up.

## 2018-01-30 ENCOUNTER — Inpatient Hospital Stay: Payer: BC Managed Care – PPO | Attending: Oncology

## 2018-01-30 ENCOUNTER — Ambulatory Visit (HOSPITAL_COMMUNITY)
Admission: RE | Admit: 2018-01-30 | Discharge: 2018-01-30 | Disposition: A | Discharge status: Home / Self Care | Payer: BC Managed Care – PPO | Source: Ambulatory Visit | Attending: Oncology | Admitting: Oncology

## 2018-01-30 DIAGNOSIS — C649 Malignant neoplasm of unspecified kidney, except renal pelvis: Secondary | ICD-10-CM | POA: Insufficient documentation

## 2018-01-30 DIAGNOSIS — Z905 Acquired absence of kidney: Secondary | ICD-10-CM | POA: Insufficient documentation

## 2018-01-30 DIAGNOSIS — Z9081 Acquired absence of spleen: Secondary | ICD-10-CM | POA: Diagnosis not present

## 2018-01-30 DIAGNOSIS — Z9889 Other specified postprocedural states: Secondary | ICD-10-CM | POA: Diagnosis not present

## 2018-01-30 DIAGNOSIS — R590 Localized enlarged lymph nodes: Secondary | ICD-10-CM | POA: Diagnosis not present

## 2018-01-30 LAB — CMP (CANCER CENTER ONLY)
ALBUMIN: 4.2 g/dL (ref 3.5–5.0)
ALK PHOS: 71 U/L (ref 40–150)
ALT: 18 U/L (ref 0–55)
ANION GAP: 9 (ref 3–11)
AST: 21 U/L (ref 5–34)
BILIRUBIN TOTAL: 0.6 mg/dL (ref 0.2–1.2)
BUN: 17 mg/dL (ref 7–26)
CO2: 25 mmol/L (ref 22–29)
Calcium: 10.1 mg/dL (ref 8.4–10.4)
Chloride: 106 mmol/L (ref 98–109)
Creatinine: 1.05 mg/dL (ref 0.60–1.10)
GFR, Est AFR Am: >60 mL/min (ref >60)
GFR, Estimated: 55 mL/min — ABNORMAL LOW (ref >60)
GLUCOSE: 105 mg/dL (ref 70–140)
POTASSIUM: 4.5 mmol/L (ref 3.5–5.1)
SODIUM: 140 mmol/L (ref 136–145)
Total Protein: 7.8 g/dL (ref 6.4–8.3)

## 2018-01-30 MED ORDER — IOPAMIDOL (ISOVUE-300) INJECTION 61%
100.0000 mL | Freq: Once | INTRAVENOUS | Status: AC | PRN
  Administered 2018-01-30: 100 mL via INTRAVENOUS

## 2018-01-30 MED ORDER — IOPAMIDOL (ISOVUE-300) INJECTION 61%
INTRAVENOUS | Status: AC
  Filled 2018-01-30: qty 100

## 2018-01-30 MED ORDER — IOPAMIDOL (ISOVUE-370) INJECTION 76%
INTRAVENOUS | Status: AC
  Filled 2018-01-30: qty 100

## 2018-02-03 ENCOUNTER — Other Ambulatory Visit (HOSPITAL_COMMUNITY): Payer: Self-pay | Admitting: Diagnostic Radiology

## 2018-02-03 DIAGNOSIS — R1900 Intra-abdominal and pelvic swelling, mass and lump, unspecified site: Secondary | ICD-10-CM

## 2018-02-09 ENCOUNTER — Ambulatory Visit: Payer: BC Managed Care – PPO | Admitting: Oncology

## 2018-02-10 ENCOUNTER — Other Ambulatory Visit: Payer: Self-pay | Admitting: Radiology

## 2018-02-11 ENCOUNTER — Other Ambulatory Visit: Payer: Self-pay | Admitting: Radiology

## 2018-02-12 ENCOUNTER — Encounter (HOSPITAL_COMMUNITY): Payer: Self-pay

## 2018-02-12 ENCOUNTER — Ambulatory Visit (HOSPITAL_COMMUNITY)
Admission: RE | Admit: 2018-02-12 | Discharge: 2018-02-12 | Disposition: A | Discharge status: Home / Self Care | Payer: BC Managed Care – PPO | Source: Ambulatory Visit | Attending: Diagnostic Radiology | Admitting: Diagnostic Radiology

## 2018-02-12 DIAGNOSIS — Z85528 Personal history of other malignant neoplasm of kidney: Secondary | ICD-10-CM | POA: Insufficient documentation

## 2018-02-12 DIAGNOSIS — E785 Hyperlipidemia, unspecified: Secondary | ICD-10-CM | POA: Insufficient documentation

## 2018-02-12 DIAGNOSIS — R590 Localized enlarged lymph nodes: Secondary | ICD-10-CM | POA: Diagnosis not present

## 2018-02-12 DIAGNOSIS — Z9049 Acquired absence of other specified parts of digestive tract: Secondary | ICD-10-CM | POA: Diagnosis not present

## 2018-02-12 DIAGNOSIS — Z9889 Other specified postprocedural states: Secondary | ICD-10-CM | POA: Insufficient documentation

## 2018-02-12 DIAGNOSIS — Z905 Acquired absence of kidney: Secondary | ICD-10-CM | POA: Diagnosis not present

## 2018-02-12 DIAGNOSIS — R1900 Intra-abdominal and pelvic swelling, mass and lump, unspecified site: Secondary | ICD-10-CM

## 2018-02-12 DIAGNOSIS — Z885 Allergy status to narcotic agent status: Secondary | ICD-10-CM | POA: Insufficient documentation

## 2018-02-12 DIAGNOSIS — Z85118 Personal history of other malignant neoplasm of bronchus and lung: Secondary | ICD-10-CM | POA: Diagnosis not present

## 2018-02-12 LAB — CBC WITH DIFFERENTIAL/PLATELET
BASOS ABS: 0.1 10*3/uL (ref 0.0–0.1)
BASOS PCT: 1 %
EOS ABS: 0.6 10*3/uL (ref 0.0–0.7)
Eosinophils Relative: 8 %
HEMATOCRIT: 45.2 % (ref 36.0–46.0)
HEMOGLOBIN: 14.8 g/dL (ref 12.0–15.0)
Lymphocytes Relative: 37 %
Lymphs Abs: 2.5 10*3/uL (ref 0.7–4.0)
MCH: 33 pg (ref 26.0–34.0)
MCHC: 32.7 g/dL (ref 30.0–36.0)
MCV: 100.9 fL — ABNORMAL HIGH (ref 78.0–100.0)
MONO ABS: 0.8 10*3/uL (ref 0.1–1.0)
Monocytes Relative: 12 %
NEUTROS ABS: 2.9 10*3/uL (ref 1.7–7.7)
NEUTROS PCT: 42 %
Platelets: 311 10*3/uL (ref 150–400)
RBC: 4.48 MIL/uL (ref 3.87–5.11)
RDW: 14.3 % (ref 11.5–15.5)
WBC: 6.8 10*3/uL (ref 4.0–10.5)

## 2018-02-12 LAB — PROTIME-INR
INR: 0.82
PROTHROMBIN TIME: 11.2 s — AB (ref 11.4–15.2)

## 2018-02-12 MED ORDER — HYDROCODONE-ACETAMINOPHEN 5-325 MG PO TABS: 1.0000 | ORAL_TABLET | ORAL | Status: DC | PRN

## 2018-02-12 MED ORDER — SODIUM CHLORIDE 0.9 % IV SOLN
INTRAVENOUS | Status: DC
  Administered 2018-02-12: 07:00:00 via INTRAVENOUS

## 2018-02-12 MED ORDER — NALOXONE HCL 0.4 MG/ML IJ SOLN
INTRAMUSCULAR | Status: AC
  Filled 2018-02-12: qty 1

## 2018-02-12 MED ORDER — MIDAZOLAM HCL 2 MG/2ML IJ SOLN
INTRAMUSCULAR | Status: AC | PRN
  Administered 2018-02-12: 0.5 mg via INTRAVENOUS
  Administered 2018-02-12 (×3): 1 mg via INTRAVENOUS
  Administered 2018-02-12: 0.5 mg via INTRAVENOUS

## 2018-02-12 MED ORDER — FLUMAZENIL 0.5 MG/5ML IV SOLN
INTRAVENOUS | Status: AC
  Filled 2018-02-12: qty 5

## 2018-02-12 MED ORDER — FENTANYL CITRATE (PF) 100 MCG/2ML IJ SOLN
INTRAMUSCULAR | Status: AC
  Filled 2018-02-12: qty 4

## 2018-02-12 MED ORDER — MIDAZOLAM HCL 2 MG/2ML IJ SOLN
INTRAMUSCULAR | Status: AC
  Filled 2018-02-12: qty 4

## 2018-02-12 MED ORDER — FENTANYL CITRATE (PF) 100 MCG/2ML IJ SOLN
INTRAMUSCULAR | Status: AC | PRN
  Administered 2018-02-12 (×2): 50 ug via INTRAVENOUS

## 2018-02-12 NOTE — H&P (Signed)
Referring Physician(s): UXLKGM,W  Supervising Physician: Markus Daft  Patient Status:  [facility]  Chief Complaint:  "I'm here for a biopsy"  Subjective: Patient familiar to IR service from prior cryoablation of a right renal cell carcinoma in February of this year.  She has a history of metastatic renal cell carcinoma with prior left nephrectomy in 2006 as well as a right lower lobe lobectomy in 2009 secondary to metastatic disease.  Recent follow-up imaging has revealed enlarged retroperitoneal lymph node dorsal to the vena cava at the level of the renal veins concerning for metastasis.  She presents today for CT-guided biopsy of the retroperitoneal lymph node for further evaluation.  She currently denies fever, headache, chest pain, dyspnea, cough, abdominal/back pain, nausea, vomiting or bleeding. Past Medical History:  Diagnosis Date  . Cancer ([place]) [id number]   kidney, lung  . Gallbladder sludge   . History of hiatal hernia   . Hyperlipidemia   . PONV (postoperative nausea and vomiting)   . Right renal mass    Past Surgical History:  Procedure Laterality Date  . BLADDER SUSPENSION  july 2011 and nov 2011  . COLONOSCOPY    . [place]  . INCISIONAL HERNIA REPAIR N/A 10/04/2015   Procedure: LAPAROSCOPIC INCISIONAL HERNIA;  Surgeon: Ralene Ok, MD;  Location: [place];  Service: General;  Laterality: N/A;  . INSERTION OF MESH N/A 10/04/2015   Procedure: WITH INSERTION OF MESH;  Surgeon: Ralene Ok, MD;  Location: [place];  Service: General;  Laterality: N/A;  . IR RADIOLOGIST EVAL & MGMT  09/10/2017  . IR RADIOLOGIST EVAL & MGMT  10/28/2017  . IR RADIOLOGIST EVAL & MGMT  01/27/2018  . LAPAROSCOPIC CHOLECYSTECTOMY  05/29/11  . LOBECTOMY  2009   right lower lung  . NEPHRECTOMY  october 2006   left kidney  . [place]  2005  . POLYPECTOMY  1985   vocal cord  . RADIOFREQUENCY ABLATION Right 09/26/2017   Procedure: CT RENAL CRYOABLATION;  Surgeon: Markus Daft, MD;  [facility];  Service: Anesthesiology;  Laterality: Right;  . RHINOPLASTY  1985  . SPLENECTOMY, TOTAL  october 2006  . TUBAL LIGATION  1983  . TUMOR REMOVAL  1992   ovarian durmoid tumors bilateral      Allergies: Oxycodone-acetaminophen  Medications: Prior to Admission medications   Not on File     Vital Signs: Blood pressure 156/83, heart rate 76, respirations 16, temp 98.3, O2 sat 100% room air  Physical Exam awake, alert.  Chest clear to auscultation bilaterally.  Heart with regular rate and rhythm.  Abdomen soft, positive bowel sounds, nontender.  No lower extremity edema.  Imaging: No results found.  Labs:  CBC: Recent Labs    08/08/17 1122 09/23/17 1346 09/27/17 0533 02/12/18 0714  WBC 6.7 7.4 13.9* 6.8  HGB 13.5 13.9 11.9* 14.8  HCT 42.1 42.8 36.1 45.2  PLT 319 368 287 311    COAGS: Recent Labs    09/23/17 1346 02/12/18 0714  INR 0.99 0.82    BMP: Recent Labs    08/08/17 1122 09/23/17 1346 09/27/17 0533 01/27/18 0838 01/30/18 1039  NA 140 137 135  --  140  K 4.5 4.5 4.6  --  4.5  CL  --  104 103  --  106  CO2 23 25 25   --  25  GLUCOSE 109 95 150*  --  105  BUN 13.8 17 16   --  17  CALCIUM  10.0 10.3 9.4  --  10.1  CREATININE 1.0 0.91 0.90 1.00 1.05  GFRNONAA  --  >60 >60  --  55*  GFRAA  --  >60 >60  --  >60    LIVER FUNCTION TESTS: Recent Labs    08/08/17 1122 01/30/18 1039  BILITOT 0.48 0.6  AST 19 21  ALT 19 18  ALKPHOS 74 71  PROT 7.8 7.8  ALBUMIN 4.1 4.2    Assessment and Plan: Pt with history of metastatic renal cell carcinoma with prior left nephrectomy in 2006 as well as a right lower lobe lobectomy in 2009 secondary to metastatic disease.  She is also status post cryoablation of a right renal cell carcinoma in February of this year.  Recent follow-up imaging has revealed enlarged retroperitoneal lymph node dorsal to the vena cava at the level of the renal veins concerning for metastasis.  She presents  today for CT-guided biopsy of the retroperitoneal lymph node for further evaluation.Risks and benefits discussed with the patient/spouse including, but not limited to bleeding, infection, damage to adjacent structures or low yield requiring additional tests.  All of the patient's questions were answered, patient is agreeable to proceed. Consent signed and in chart.     Electronically Signed: D. Rowe Robert, PA-C 02/12/2018, 8:16 AM   I spent a total of 20 minutes at the the patient's bedside AND on the patient's hospital floor or unit, greater than 50% of which was counseling/coordinating care for CT-guided biopsy of right retroperitoneal lymph node

## 2018-02-12 NOTE — Discharge Instructions (Signed)

## 2018-02-12 NOTE — Procedures (Signed)
CT guided FNA and core biopsies of right retroperitoneal lymph node.  Small amount of bleeding around biopsy site but appears to be separate from IVC.  Minimal blood loss and no immediate complication.

## 2018-02-19 ENCOUNTER — Other Ambulatory Visit: Payer: Self-pay | Admitting: Oncology

## 2018-02-19 ENCOUNTER — Telehealth: Payer: Self-pay | Admitting: Diagnostic Radiology

## 2018-02-19 NOTE — Progress Notes (Signed)
Called patient to discuss recent biopsy results. Dr. Alen Blew will present her at next GU tumor conference to discuss management.  Patient is comfortable with this plan.

## 2018-02-19 NOTE — Progress Notes (Signed)
The results of her lymph node biopsy was noted and was discussed with Dr. Anselm Pancoast.  These results were also discussed with Ms. Haymaker over the phone.  The plan is to present her case in the GU tumor board and consider definitive radiation therapy to the abnormal lymph node if it is feasible at this time.  Observation and surveillance could be an alternative option because of lack of definitive diagnosis at this time.  These options were presented to her today and she is open to both of those options depending on the consensus.

## 2018-03-17 ENCOUNTER — Other Ambulatory Visit: Payer: Self-pay | Admitting: Oncology

## 2018-03-17 DIAGNOSIS — C649 Malignant neoplasm of unspecified kidney, except renal pelvis: Secondary | ICD-10-CM

## 2018-03-17 NOTE — Progress Notes (Signed)
I have updated Jenny Soto about her GU tumor board discussion.  The general consensus is to repeat imaging studies in 3 months from her previous CT scan.  If her pelvic lymph node continues to enlarge, definitive therapy with surgery versus radiation would be the next approach.  She is agreeable with this plan and will arrange for a CT scan to be done in September 2019.

## 2018-05-01 ENCOUNTER — Ambulatory Visit (HOSPITAL_COMMUNITY)
Admission: RE | Admit: 2018-05-01 | Discharge: 2018-05-01 | Disposition: A | Discharge status: Home / Self Care | Payer: Medicare Other | Source: Ambulatory Visit | Attending: Oncology | Admitting: Oncology

## 2018-05-01 ENCOUNTER — Inpatient Hospital Stay: Payer: Medicare Other | Attending: Oncology

## 2018-05-01 DIAGNOSIS — Z905 Acquired absence of kidney: Secondary | ICD-10-CM | POA: Insufficient documentation

## 2018-05-01 DIAGNOSIS — C649 Malignant neoplasm of unspecified kidney, except renal pelvis: Secondary | ICD-10-CM | POA: Insufficient documentation

## 2018-05-01 DIAGNOSIS — C641 Malignant neoplasm of right kidney, except renal pelvis: Secondary | ICD-10-CM | POA: Insufficient documentation

## 2018-05-01 LAB — CBC WITH DIFFERENTIAL (CANCER CENTER ONLY)
Basophils Absolute: 0.1 10*3/uL (ref 0.0–0.1)
Basophils Relative: 1 %
Eosinophils Absolute: 0.2 10*3/uL (ref 0.0–0.5)
Eosinophils Relative: 4 %
HEMATOCRIT: 42.1 % (ref 34.8–46.6)
Hemoglobin: 13.6 g/dL (ref 11.6–15.9)
LYMPHS ABS: 2.3 10*3/uL (ref 0.9–3.3)
LYMPHS PCT: 41 %
MCH: 33 pg (ref 25.1–34.0)
MCHC: 32.3 g/dL (ref 31.5–36.0)
MCV: 102.2 fL — AB (ref 79.5–101.0)
MONO ABS: 0.6 10*3/uL (ref 0.1–0.9)
MONOS PCT: 10 %
NEUTROS ABS: 2.5 10*3/uL (ref 1.5–6.5)
Neutrophils Relative %: 44 %
Platelet Count: 297 10*3/uL (ref 145–400)
RBC: 4.12 MIL/uL (ref 3.70–5.45)
RDW: 14.1 % (ref 11.2–14.5)
WBC Count: 5.7 10*3/uL (ref 3.9–10.3)

## 2018-05-01 LAB — CMP (CANCER CENTER ONLY)
ALBUMIN: 4.2 g/dL (ref 3.5–5.0)
ALK PHOS: 62 U/L (ref 38–126)
ALT: 21 U/L (ref 0–44)
ANION GAP: 8 (ref 5–15)
AST: 22 U/L (ref 15–41)
BUN: 13 mg/dL (ref 8–23)
CO2: 26 mmol/L (ref 22–32)
Calcium: 10.3 mg/dL (ref 8.9–10.3)
Chloride: 105 mmol/L (ref 98–111)
Creatinine: 1.07 mg/dL — ABNORMAL HIGH (ref 0.44–1.00)
GFR, Est AFR Am: >60 mL/min (ref >60)
GFR, Estimated: 53 mL/min — ABNORMAL LOW (ref >60)
GLUCOSE: 102 mg/dL — AB (ref 70–99)
POTASSIUM: 4.2 mmol/L (ref 3.5–5.1)
SODIUM: 139 mmol/L (ref 135–145)
Total Bilirubin: 0.7 mg/dL (ref 0.3–1.2)
Total Protein: 7.6 g/dL (ref 6.5–8.1)

## 2018-05-01 MED ORDER — IOPAMIDOL (ISOVUE-300) INJECTION 61%: 100.0000 mL | Freq: Once | INTRAVENOUS | Status: DC | PRN

## 2018-05-01 MED ORDER — IOHEXOL 300 MG/ML  SOLN
100.0000 mL | Freq: Once | INTRAMUSCULAR | Status: AC | PRN
  Administered 2018-05-01: 80 mL via INTRAVENOUS

## 2018-07-01 ENCOUNTER — Telehealth: Payer: Self-pay | Admitting: Adult Health

## 2018-07-01 ENCOUNTER — Other Ambulatory Visit: Payer: Self-pay

## 2018-07-01 DIAGNOSIS — Z0000 Encounter for general adult medical examination without abnormal findings: Secondary | ICD-10-CM

## 2018-07-01 DIAGNOSIS — E785 Hyperlipidemia, unspecified: Secondary | ICD-10-CM

## 2018-07-01 NOTE — Telephone Encounter (Signed)
Orders entered.  Pt notified.  Charyl Bigger, CMA

## 2018-07-01 NOTE — Telephone Encounter (Signed)
Patient called to set up CPE for both her & spouse appt date 08/06/18.  --Per patient she already has a lab appt & the [facility] on 12/6 but know CMP. BMP, FLP not included in this draw.   Patient request PCP/Katy give approval & enter orders for labs that will be needed for her CPE in Epic so only (1) blood draw would have to be done on 12/16 @ [place].  Forwarding request to medical assistant for review with provider & contact pt if there are any problems.  --glh

## 2018-07-24 ENCOUNTER — Encounter: Payer: Self-pay | Admitting: Adult Health

## 2018-07-31 ENCOUNTER — Inpatient Hospital Stay (HOSPITAL_BASED_OUTPATIENT_CLINIC_OR_DEPARTMENT_OTHER): Payer: Medicare Other | Admitting: Oncology

## 2018-07-31 ENCOUNTER — Inpatient Hospital Stay: Payer: Medicare Other | Attending: Oncology

## 2018-07-31 ENCOUNTER — Telehealth: Payer: Self-pay

## 2018-07-31 VITALS — BP 143/73 | HR 71 | Temp 97.7°F | Resp 17 | Ht 64.0 in | Wt 149.1 lb

## 2018-07-31 DIAGNOSIS — Z85528 Personal history of other malignant neoplasm of kidney: Secondary | ICD-10-CM

## 2018-07-31 DIAGNOSIS — Z905 Acquired absence of kidney: Secondary | ICD-10-CM | POA: Insufficient documentation

## 2018-07-31 DIAGNOSIS — C649 Malignant neoplasm of unspecified kidney, except renal pelvis: Secondary | ICD-10-CM

## 2018-07-31 DIAGNOSIS — R590 Localized enlarged lymph nodes: Secondary | ICD-10-CM | POA: Insufficient documentation

## 2018-07-31 LAB — CBC WITH DIFFERENTIAL/PLATELET
ABS IMMATURE GRANULOCYTES: 0.01 10*3/uL (ref 0.00–0.07)
BASOS ABS: 0.1 10*3/uL (ref 0.0–0.1)
Basophils Relative: 1 %
Eosinophils Absolute: 0.2 10*3/uL (ref 0.0–0.5)
Eosinophils Relative: 4 %
HEMATOCRIT: 43.2 % (ref 36.0–46.0)
HEMOGLOBIN: 13.7 g/dL (ref 12.0–15.0)
IMMATURE GRANULOCYTES: 0 %
LYMPHS ABS: 2.4 10*3/uL (ref 0.7–4.0)
Lymphocytes Relative: 42 %
MCH: 32.2 pg (ref 26.0–34.0)
MCHC: 31.7 g/dL (ref 30.0–36.0)
MCV: 101.6 fL — AB (ref 80.0–100.0)
MONOS PCT: 11 %
Monocytes Absolute: 0.6 10*3/uL (ref 0.1–1.0)
NEUTROS ABS: 2.4 10*3/uL (ref 1.7–7.7)
NRBC: 0 % (ref 0.0–0.2)
Neutrophils Relative %: 42 %
Platelets: 280 10*3/uL (ref 150–400)
RBC: 4.25 MIL/uL (ref 3.87–5.11)
RDW: 13.3 % (ref 11.5–15.5)
WBC: 5.7 10*3/uL (ref 4.0–10.5)

## 2018-07-31 LAB — COMPREHENSIVE METABOLIC PANEL
ALT: 18 U/L (ref 0–44)
AST: 20 U/L (ref 15–41)
Albumin: 4 g/dL (ref 3.5–5.0)
Alkaline Phosphatase: 60 U/L (ref 38–126)
Anion gap: 10 (ref 5–15)
BILIRUBIN TOTAL: 0.7 mg/dL (ref 0.3–1.2)
BUN: 12 mg/dL (ref 8–23)
CO2: 25 mmol/L (ref 22–32)
CREATININE: 1.08 mg/dL — AB (ref 0.44–1.00)
Calcium: 10.3 mg/dL (ref 8.9–10.3)
Chloride: 106 mmol/L (ref 98–111)
GFR calc non Af Amer: 54 mL/min — ABNORMAL LOW (ref >60)
Glucose, Bld: 127 mg/dL — ABNORMAL HIGH (ref 70–99)
POTASSIUM: 4.6 mmol/L (ref 3.5–5.1)
Sodium: 141 mmol/L (ref 135–145)
TOTAL PROTEIN: 7.5 g/dL (ref 6.5–8.1)

## 2018-07-31 NOTE — Telephone Encounter (Signed)
Printed avs and calender of upcoming appointment. Per 12/6 los 

## 2018-07-31 NOTE — Progress Notes (Signed)
Hematology and Oncology Follow Up Visit  THEORA VANKIRK [id number] 06/28/1951 67 y.o. 07/31/2018 9:56 AM  [name], M.D.  [place] Rosana Hoes, M.D.  [name], MD    Principle Diagnosis: 67 year old woman with renal cell carcinoma diagnosed in 2006. She developed isolated lung metastasis in 2009.  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  Prior Therapy:  1. Status post left nephrectomy in 2006.   2. The patient underwent surgical resection, VATS procedure and wedge resection of the right lower lobe done in 2009.  The pathology showed recurrence of her original renal cell carcinoma. 3. She is status post cryoablation of a right renal mass in February 2019.  Current therapy: Active surveillance.  Interim History: Mrs. Gal returns today for a repeat evaluation.  Since her last visit, she underwent a cryoablation for a recurrent renal mass which she has tolerated very well in February 2019.  She subsequently had a CT scan in June 2019 which showed an enlarging retroperitoneal lymph node that was biopsied on February 12, 2018 and showed atypical cells without any malignancy.  Repeat imaging studies and September 2019 showed resolution of her adenopathy.  Clinically she reports feeling well at this time without any symptoms.  He does report some mild discomfort in her right upper quadrant of the abdomen that has been dull and not associated with any other symptoms.  She has been exercising regularly and continues to perform activities of daily living without any decline.    She has not reported any headaches or blurry vision. Does not report any syncope or seizures.  He denies any alteration mental status or confusion.  She denies any chest pain, palpitation, orthopnea or leg edema.  She denies any cough, wheezing or hemoptysis.  She denies any nausea, vomiting or abdominal distention.  She denies any changes in bowel habits.  Does not report any bleeding or clotting tendency. She has  not reported any frequency, urgency or hesitancy.  Denies any heat or cold intolerance.  The rest of symptoms review of systems is negative.  Medications: I have reviewed the patient's current medications. No current outpatient medications on file.  Allergies:  Allergies  Allergen Reactions  . Oxycodone-Acetaminophen Nausea And Vomiting    Past Medical History, Surgical history, Social history, and Family History discussed today and unchanged.  Physical examination Blood pressure (!) 143/73, pulse 71, temperature 97.7 F (36.5 C), temperature source Oral, resp. rate 17, height 5\' 4"  (1.626 m), weight 149 lb 1.6 oz (67.6 kg), SpO2 100 %.   ECOG: 0   General appearance: Comfortable appearing without any discomfort Head: Normocephalic without any trauma Oropharynx: Mucous membranes are moist and pink without any thrush or ulcers. Eyes: Pupils are equal and round reactive to light. Lymph nodes: No cervical, supraclavicular, inguinal or axillary lymphadenopathy.   Heart:regular rate and rhythm.  S1 and S2 without leg edema. Lung: Clear without any rhonchi or wheezes.  No dullness to percussion. Abdomin: Soft, nontender, nondistended with good bowel sounds.  No hepatosplenomegaly. Musculoskeletal: No joint deformity or effusion.  Full range of motion noted. Neurological: No deficits noted on motor, sensory and deep tendon reflex exam. Skin: No petechial rash or dryness.  Appeared moist.    Lab Results: Lab Results  Component Value Date   WBC 5.7 05/01/2018   HGB 13.6 05/01/2018   HCT 42.1 05/01/2018   MCV 102.2 (H) 05/01/2018   PLT 297 05/01/2018     Chemistry  Component Value Date/Time   NA 139 05/01/2018 0932   NA 140 08/08/2017 1122   K 4.2 05/01/2018 0932   K 4.5 08/08/2017 1122   CL 105 05/01/2018 0932   CL 105 11/11/2012 0956   CO2 26 05/01/2018 0932   CO2 23 08/08/2017 1122   BUN 13 05/01/2018 0932   BUN 13.8 08/08/2017 1122   CREATININE 1.07 (H) 05/01/2018  0932   CREATININE 1.0 08/08/2017 1122      Component Value Date/Time   CALCIUM 10.3 05/01/2018 0932   CALCIUM 10.0 08/08/2017 1122   ALKPHOS 62 05/01/2018 0932   ALKPHOS 74 08/08/2017 1122   AST 22 05/01/2018 0932   AST 19 08/08/2017 1122   ALT 21 05/01/2018 0932   ALT 19 08/08/2017 1122   BILITOT 0.7 05/01/2018 0932   BILITOT 0.48 08/08/2017 1122      Impression and plan:  67 year old woman with  1.  Left kidney cancer diagnosed and 2006.  She underwent a nephrectomy for 12.3 cm clear cell histology and subsequently developed isolated pulmonary metastasis that was resected in 2009.  She remains disease-free at this time with a recent imaging studies in June and September 2019 that did not show any evidence of metastatic disease.  She will continue to be on active surveillance at this time with repeat imaging studies in 3 to 6 months.  2.  Right kidney neoplasm: Status post ablation in February 2019.  She will have repeat imaging studies in December 2019 for surveillance.  3.  Retroperitoneal lymph node enlargement: Appears to be reactive in nature based on imaging studies in September 2019.  We will continue to monitor periodically.  4.  Follow-up: We will be in 6 months sooner if her recent imaging studies showed any abnormalities.  15  minutes was spent with the patient face-to-face today.  More than 50% of time was dedicated to reviewing her disease status, reviewing multiple imaging studies and answering questions regarding future plan of care.     Zola Button, MD 12/6/20199:56 AM

## 2018-08-04 NOTE — Progress Notes (Signed)
Subjective:    Patient ID: Jenny Soto, female    DOB: [birth year], 67 y.o.   MRN: [id number]  HPI: 10/02/16 OV: [name] presents to establish as a new pt.  Has a hx advanced renal cell carcinoma with 12 cm tumor grade 3 clear cell histology. She was treated with left nephrectomy in 2006.   She developed isolated lung metastasis in 2009.  She underwent surgical resection, VATS procedure and wedge resection of the right lower lobe done in 2009.  The pathology showed recurrence of her original renal cell carcinoma. She is followed by Oncologist-annual CT scans and lab every 6 months, last encounter was 08/2016.  She denies any acute sx's and feel "very well generally, except I have put on a few lbs due to unusually cold winter".  She has hx of hyperlipidemia that she has treated with lifestyle modifications.    08/06/18 OV: [name] presents for CPE This is her first OV since establishing with practice She has been followed closely by Oncology with serial CT scans   Next CT is scheduled for 08/10/18 due to persistent abdominal pain She remains quite active: walks 3 miles 1-3 times/week, weight training 2/week, fishing, kayaking, daily stretching She estimates to drink >70 oz water/day and has really focused on reducing saturated fat/sugar from diet She reports severe myalgia when taking statin Last LDL 09/2016- 182 She denies tobacco/vape use and will occasional drink "[place] beer [place] the pier" when they are at their beach home and several beers during the week when here locally  She denies CP/dyspnea/dizziness/HA/palpitations She denies recent falls or instability  Healthcare Maintenance: PAP-UTD, last normal per pt- tracking down report Mammogram-ordered Immunizations-UTD LDCT-N/A due to multiple CT of chest over the last few months DEXA- pt declined  Patient Care Team    Relationship Specialty Notifications Start End  Esaw Grandchild, NP PCP - General Family Medicine  10/02/16    Wyatt Portela, MD Consulting Physician Oncology  10/02/16   Janyth Contes, MD Consulting Physician Obstetrics and Gynecology  10/02/16   Devra Dopp, MD Referring Physician Dermatology  10/02/16   Rutherford Guys, MD Consulting Physician Ophthalmology  10/02/16   Wyatt Portela, MD Consulting Physician Oncology  10/02/16     Patient Active Problem List   Diagnosis Date Noted  . Healthcare maintenance 08/06/2018  . Renal cell carcinoma of right kidney ([place]) 09/26/2017  . Elevated lipids 10/02/2016  . Fatigue 10/02/2016  . Renal cell cancer ([place]) 05/27/2011  . Symptomatic cholelithiasis 05/27/2011     Past Medical History:  Diagnosis Date  . Cancer ([place]) [id number]   kidney, lung  . Gallbladder sludge   . History of hiatal hernia   . Hyperlipidemia   . PONV (postoperative nausea and vomiting)   . Right renal mass      Past Surgical History:  Procedure Laterality Date  . BLADDER SUSPENSION  july 2011 and nov 2011  . COLONOSCOPY    . [place]  . INCISIONAL HERNIA REPAIR N/A 10/04/2015   Procedure: LAPAROSCOPIC INCISIONAL HERNIA;  Surgeon: Ralene Ok, MD;  Location: [facility];  Service: General;  Laterality: N/A;  . INSERTION OF MESH N/A 10/04/2015   Procedure: WITH INSERTION OF MESH;  Surgeon: Ralene Ok, MD;  Location: [facility];  Service: General;  Laterality: N/A;  . IR RADIOLOGIST EVAL & MGMT  09/10/2017  . IR RADIOLOGIST EVAL & MGMT  10/28/2017  . IR RADIOLOGIST EVAL & MGMT  01/27/2018  .  LAPAROSCOPIC CHOLECYSTECTOMY  05/29/11  . LOBECTOMY  2009   right lower lung  . NEPHRECTOMY  october 2006   left kidney  . Spur  2005  . POLYPECTOMY  1985   vocal cord  . RADIOFREQUENCY ABLATION Right 09/26/2017   Procedure: CT RENAL CRYOABLATION;  Surgeon: Markus Daft, MD;  Location: [facility];  Service: Anesthesiology;  Laterality: Right;  . RHINOPLASTY  1985  . SPLENECTOMY, TOTAL  october 2006  . TUBAL LIGATION  1983  . TUMOR REMOVAL  1992    ovarian durmoid tumors bilateral     Family History  Adopted: Yes  Problem Relation Age of Onset  . Heart disease Mother   . Colon cancer Neg Hx      Social History   Substance and Sexual Activity  Drug Use No     Social History   Substance and Sexual Activity  Alcohol Use Yes  . Alcohol/week: 6.0 standard drinks  . Types: 3 Glasses of wine, 3 Cans of beer per week   Comment: wine or beer every night     Social History   Tobacco Use  Smoking Status Former Smoker  . Packs/day: 0.50  . Years: 20.00  . Pack years: 10.00  . Types: Cigarettes  . Last attempt to quit: 08/26/2004  . Years since quitting: 13.9  Smokeless Tobacco Never Used     No outpatient encounter medications on file as of 08/06/2018.   No facility-administered encounter medications on file as of 08/06/2018.     Allergies: Oxycodone-acetaminophen  Body mass index is 26.06 kg/m.  Blood pressure 128/74, pulse 72, temperature 98.2 F (36.8 C), temperature source Oral, height 5' 3.25" (1.607 m), weight 148 lb 4.8 oz (67.3 kg), SpO2 99 %.  Review of Systems  Constitutional: Positive for fatigue. Negative for activity change, appetite change, chills, diaphoresis and unexpected weight change.  HENT: Negative for congestion.   Eyes: Negative for visual disturbance.  Respiratory: Negative for cough and shortness of breath.   Cardiovascular: Negative for chest pain, palpitations and leg swelling.  Gastrointestinal: Negative for abdominal distention, constipation and diarrhea.  Endocrine: Negative for cold intolerance, heat intolerance, polydipsia, polyphagia and polyuria.  Genitourinary: Negative for difficulty urinating, dysuria, flank pain and frequency.  Musculoskeletal: Negative for arthralgias and back pain.  Skin: Negative for color change, pallor, rash and wound.  Neurological: Negative for dizziness, tremors and weakness.  Psychiatric/Behavioral: Negative for agitation, behavioral  problems, confusion and sleep disturbance.  :     Objective:   Physical Exam  Constitutional: She is oriented to person, place, and time. She appears well-developed and well-nourished. No distress.  HENT:  Head: Normocephalic and atraumatic.  Right Ear: External ear normal. Tympanic membrane is not erythematous and not bulging. No decreased hearing is noted.  Left Ear: External ear normal. Tympanic membrane is not erythematous and not bulging. No decreased hearing is noted.  Nose: Nose normal. No mucosal edema or rhinorrhea. Right sinus exhibits no maxillary sinus tenderness and no frontal sinus tenderness. Left sinus exhibits no maxillary sinus tenderness and no frontal sinus tenderness.  Mouth/Throat: Uvula is midline, oropharynx is clear and moist and mucous membranes are normal. No posterior oropharyngeal edema or posterior oropharyngeal erythema.  Eyes: Pupils are equal, round, and reactive to light. Conjunctivae and EOM are normal.  Neck: Normal range of motion. Neck supple. No tracheal deviation present. No thyromegaly present.  Cardiovascular: Normal rate, regular rhythm, normal heart sounds and intact distal pulses.  No murmur heard. Pulmonary/Chest:  Effort normal and breath sounds normal. No respiratory distress. She has no wheezes. She has no rales. She exhibits no tenderness.  Abdominal: Soft. Bowel sounds are normal. She exhibits no distension and no mass. There is abdominal tenderness in the right upper quadrant and right lower quadrant. There is no rebound, no guarding and no CVA tenderness.  Well healed surgical scar L posterior chest r/t 2006 L nephrectomy   Musculoskeletal: Normal range of motion.        General: No tenderness or edema.  Lymphadenopathy:    She has no cervical adenopathy.  Neurological: She is alert and oriented to person, place, and time. She has normal reflexes. Coordination normal.  Skin: Skin is warm and dry. No rash noted. She is not diaphoretic. No  erythema. No pallor.  Psychiatric: She has a normal mood and affect. Her behavior is normal. Judgment and thought content normal.  Nursing note and vitals reviewed.      Assessment & Plan:   1. Screening for breast cancer   2. Postmenopausal   3. Estrogen deficiency   4. Elevated lipids   5. Healthcare maintenance   6. Renal cell carcinoma of right kidney [facility])     Healthcare maintenance Continue drinking plenty of water and eating a heart healthy diet. Continue regular exercise, you are doing a GREAT job remaining active! Continue with Oncology as directed. Good luck with CT scan on Monday! Please schedule fasting lab appt. Recommend annual physical with fasting labs.  Elevated lipids 10/02/2016- The 10-year ASCVD risk score Mikey Bussing DC Jr., et al., 2013) is: 6.1%   Values used to calculate the score:     Age: 76 years     Sex: Female     Is Non-Hispanic African American: No     Diabetic: No     Tobacco smoker: No     Systolic Blood Pressure: 672 mmHg     Is BP treated: No     HDL Cholesterol: 68 mg/dL     Total Cholesterol: 282 mg/dL  LDL-182 She reports previously being statin intolerant, re: severe myalgia Instructed to schedule fasting lab appt ASAP   Renal cell carcinoma of right kidney ([place]) Followed by oncology Has CT scan schedule 08/10/18    FOLLOW-UP:  Return in about 1 year (around 08/07/2019) for CPE, Fasting Labs.

## 2018-08-06 ENCOUNTER — Ambulatory Visit (INDEPENDENT_AMBULATORY_CARE_PROVIDER_SITE_OTHER): Payer: Medicare Other | Admitting: Adult Health

## 2018-08-06 ENCOUNTER — Ambulatory Visit: Payer: BC Managed Care – PPO | Admitting: Oncology

## 2018-08-06 ENCOUNTER — Other Ambulatory Visit: Payer: BC Managed Care – PPO

## 2018-08-06 ENCOUNTER — Encounter: Payer: Self-pay | Admitting: Adult Health

## 2018-08-06 VITALS — BP 128/74 | HR 72 | Temp 98.2°F | Ht 63.25 in | Wt 148.3 lb

## 2018-08-06 DIAGNOSIS — E2839 Other primary ovarian failure: Secondary | ICD-10-CM

## 2018-08-06 DIAGNOSIS — Z1239 Encounter for other screening for malignant neoplasm of breast: Secondary | ICD-10-CM | POA: Diagnosis not present

## 2018-08-06 DIAGNOSIS — C641 Malignant neoplasm of right kidney, except renal pelvis: Secondary | ICD-10-CM

## 2018-08-06 DIAGNOSIS — Z780 Asymptomatic menopausal state: Secondary | ICD-10-CM

## 2018-08-06 DIAGNOSIS — E785 Hyperlipidemia, unspecified: Secondary | ICD-10-CM | POA: Diagnosis not present

## 2018-08-06 DIAGNOSIS — Z0000 Encounter for general adult medical examination without abnormal findings: Secondary | ICD-10-CM | POA: Insufficient documentation

## 2018-08-06 NOTE — Assessment & Plan Note (Signed)
Continue drinking plenty of water and eating a heart healthy diet. Continue regular exercise, you are doing a GREAT job remaining active! Continue with Oncology as directed. Good luck with CT scan on Monday! Please schedule fasting lab appt. Recommend annual physical with fasting labs.

## 2018-08-06 NOTE — Patient Instructions (Addendum)
Preventive Care for Adults, Female  A healthy lifestyle and preventive care can promote health and wellness. Preventive health guidelines for women include the following key practices.   A routine yearly physical is a good way to check with your health care provider about your health and preventive screening. It is a chance to share any concerns and updates on your health and to receive a thorough exam.   Visit your dentist for a routine exam and preventive care every 6 months. Brush your teeth twice a day and floss once a day. Good oral hygiene prevents tooth decay and gum disease.   The frequency of eye exams is based on your age, health, family medical history, use of contact lenses, and other factors. Follow your health care provider's recommendations for frequency of eye exams.   Eat a healthy diet. Foods like vegetables, fruits, whole grains, low-fat dairy products, and lean protein foods contain the nutrients you need without too many calories. Decrease your intake of foods high in solid fats, added sugars, and salt. Eat the right amount of calories for you.Get information about a proper diet from your health care provider, if necessary.   Regular physical exercise is one of the most important things you can do for your health. Most adults should get at least 150 minutes of moderate-intensity exercise (any activity that increases your heart rate and causes you to sweat) each week. In addition, most adults need muscle-strengthening exercises on 2 or more days a week.   Maintain a healthy weight. The body mass index (BMI) is a screening tool to identify possible weight problems. It provides an estimate of body fat based on height and weight. Your health care provider can find your BMI, and can help you achieve or maintain a healthy weight.For adults 20 years and older:   - A BMI below 18.5 is considered underweight.   - A BMI of 18.5 to 24.9 is normal.   - A BMI of 25 to 29.9 is  considered overweight.   - A BMI of 30 and above is considered obese.   Maintain normal blood lipids and cholesterol levels by exercising and minimizing your intake of trans and saturated fats.  Eat a balanced diet with plenty of fruit and vegetables. Blood tests for lipids and cholesterol should begin at age 20 and be repeated every 5 years minimum.  If your lipid or cholesterol levels are high, you are over 40, or you are at high risk for heart disease, you may need your cholesterol levels checked more frequently.Ongoing high lipid and cholesterol levels should be treated with medicines if diet and exercise are not working.   If you smoke, find out from your health care provider how to quit. If you do not use tobacco, do not start.   Lung cancer screening is recommended for adults aged 55-80 years who are at high risk for developing lung cancer because of a history of smoking. A yearly low-dose CT scan of the lungs is recommended for people who have at least a 30-pack-year history of smoking and are a current smoker or have quit within the past 15 years. A pack year of smoking is smoking an average of 1 pack of cigarettes a day for 1 year (for example: 1 pack a day for 30 years or 2 packs a day for 15 years). Yearly screening should continue until the smoker has stopped smoking for at least 15 years. Yearly screening should be stopped for people who develop a   health problem that would prevent them from having lung cancer treatment.   If you are pregnant, do not drink alcohol. If you are breastfeeding, be very cautious about drinking alcohol. If you are not pregnant and choose to drink alcohol, do not have more than 1 drink per day. One drink is considered to be 12 ounces (355 mL) of beer, 5 ounces (148 mL) of wine, or 1.5 ounces (44 mL) of liquor.   Avoid use of street drugs. Do not share needles with anyone. Ask for help if you need support or instructions about stopping the use of  drugs.   High blood pressure causes heart disease and increases the risk of stroke. Your blood pressure should be checked at least yearly.  Ongoing high blood pressure should be treated with medicines if weight loss and exercise do not work.   If you are 69-55 years old, ask your health care provider if you should take aspirin to prevent strokes.   Diabetes screening involves taking a blood sample to check your fasting blood sugar level. This should be done once every 3 years, after age 38, if you are within normal weight and without risk factors for diabetes. Testing should be considered at a younger age or be carried out more frequently if you are overweight and have at least 1 risk factor for diabetes.   Breast cancer screening is essential preventive care for women. You should practice "breast self-awareness."  This means understanding the normal appearance and feel of your breasts and may include breast self-examination.  Any changes detected, no matter how small, should be reported to a health care provider.  Women in their 80s and 30s should have a clinical breast exam (CBE) by a health care provider as part of a regular health exam every 1 to 3 years.  After age 66, women should have a CBE every year.  Starting at age 1, women should consider having a mammogram (breast X-ray test) every year.  Women who have a family history of breast cancer should talk to their health care provider about genetic screening.  Women at a high risk of breast cancer should talk to their health care providers about having an MRI and a mammogram every year.   -Breast cancer gene (BRCA)-related cancer risk assessment is recommended for women who have family members with BRCA-related cancers. BRCA-related cancers include breast, ovarian, tubal, and peritoneal cancers. Having family members with these cancers may be associated with an increased risk for harmful changes (mutations) in the breast cancer genes BRCA1 and  BRCA2. Results of the assessment will determine the need for genetic counseling and BRCA1 and BRCA2 testing.   The Pap test is a screening test for cervical cancer. A Pap test can show cell changes on the cervix that might become cervical cancer if left untreated. A Pap test is a procedure in which cells are obtained and examined from the lower end of the uterus (cervix).   - Women should have a Pap test starting at age 57.   - Between ages 90 and 70, Pap tests should be repeated every 2 years.   - Beginning at age 63, you should have a Pap test every 3 years as long as the past 3 Pap tests have been normal.   - Some women have medical problems that increase the chance of getting cervical cancer. Talk to your health care provider about these problems. It is especially important to talk to your health care provider if a  new problem develops soon after your last Pap test. In these cases, your health care provider may recommend more frequent screening and Pap tests.   - The above recommendations are the same for women who have or have not gotten the vaccine for human papillomavirus (HPV).   - If you had a hysterectomy for a problem that was not cancer or a condition that could lead to cancer, then you no longer need Pap tests. Even if you no longer need a Pap test, a regular exam is a good idea to make sure no other problems are starting.   - If you are between ages 36 and 66 years, and you have had normal Pap tests going back 10 years, you no longer need Pap tests. Even if you no longer need a Pap test, a regular exam is a good idea to make sure no other problems are starting.   - If you have had past treatment for cervical cancer or a condition that could lead to cancer, you need Pap tests and screening for cancer for at least 20 years after your treatment.   - If Pap tests have been discontinued, risk factors (such as a new sexual partner) need to be reassessed to determine if screening should  be resumed.   - The HPV test is an additional test that may be used for cervical cancer screening. The HPV test looks for the virus that can cause the cell changes on the cervix. The cells collected during the Pap test can be tested for HPV. The HPV test could be used to screen women aged 70 years and older, and should be used in women of any age who have unclear Pap test results. After the age of 67, women should have HPV testing at the same frequency as a Pap test.   Colorectal cancer can be detected and often prevented. Most routine colorectal cancer screening begins at the age of 57 years and continues through age 26 years. However, your health care provider may recommend screening at an earlier age if you have risk factors for colon cancer. On a yearly basis, your health care provider may provide home test kits to check for hidden blood in the stool.  Use of a small camera at the end of a tube, to directly examine the colon (sigmoidoscopy or colonoscopy), can detect the earliest forms of colorectal cancer. Talk to your health care provider about this at age 23, when routine screening begins. Direct exam of the colon should be repeated every 5 -10 years through age 49 years, unless early forms of pre-cancerous polyps or small growths are found.   People who are at an increased risk for hepatitis B should be screened for this virus. You are considered at high risk for hepatitis B if:  -You were born in a country where hepatitis B occurs often. Talk with your health care provider about which countries are considered high risk.  - Your parents were born in a high-risk country and you have not received a shot to protect against hepatitis B (hepatitis B vaccine).  - You have HIV or AIDS.  - You use needles to inject street drugs.  - You live with, or have sex with, someone who has Hepatitis B.  - You get hemodialysis treatment.  - You take certain medicines for conditions like cancer, organ  transplantation, and autoimmune conditions.   Hepatitis C blood testing is recommended for all people born from [birth year] through 1965 and any individual  with known risks for hepatitis C.   Practice safe sex. Use condoms and avoid high-risk sexual practices to reduce the spread of sexually transmitted infections (STIs). STIs include gonorrhea, chlamydia, syphilis, trichomonas, herpes, HPV, and human immunodeficiency virus (HIV). Herpes, HIV, and HPV are viral illnesses that have no cure. They can result in disability, cancer, and death. Sexually active women aged 25 years and younger should be checked for chlamydia. Older women with new or multiple partners should also be tested for chlamydia. Testing for other STIs is recommended if you are sexually active and at increased risk.   Osteoporosis is a disease in which the bones lose minerals and strength with aging. This can result in serious bone fractures or breaks. The risk of osteoporosis can be identified using a bone density scan. Women ages 65 years and over and women at risk for fractures or osteoporosis should discuss screening with their health care providers. Ask your health care provider whether you should take a calcium supplement or vitamin D to There are also several preventive steps women can take to avoid osteoporosis and resulting fractures or to keep osteoporosis from worsening. -->Recommendations include:  Eat a balanced diet high in fruits, vegetables, calcium, and vitamins.  Get enough calcium. The recommended total intake of is 1,200 mg daily; for best absorption, if taking supplements, divide doses into 250-500 mg doses throughout the day. Of the two types of calcium, calcium carbonate is best absorbed when taken with food but calcium citrate can be taken on an empty stomach.  Get enough vitamin D. NAMS and the National Osteoporosis Foundation recommend at least 1,000 IU per day for women age 50 and over who are at risk of vitamin D  deficiency. Vitamin D deficiency can be caused by inadequate sun exposure (for example, those who live in northern latitudes).  Avoid alcohol and smoking. Heavy alcohol intake (more than 7 drinks per week) increases the risk of falls and hip fracture and women smokers tend to lose bone more rapidly and have lower bone mass than nonsmokers. Stopping smoking is one of the most important changes women can make to improve their health and decrease risk for disease.  Be physically active every day. Weight-bearing exercise (for example, fast walking, hiking, jogging, and weight training) may strengthen bones or slow the rate of bone loss that comes with aging. Balancing and muscle-strengthening exercises can reduce the risk of falling and fracture.  Consider therapeutic medications. Currently, several types of effective drugs are available. Healthcare providers can recommend the type most appropriate for each woman.  Eliminate environmental factors that may contribute to accidents. Falls cause nearly 90% of all osteoporotic fractures, so reducing this risk is an important bone-health strategy. Measures include ample lighting, removing obstructions to walking, using nonskid rugs on floors, and placing mats and/or grab bars in showers.  Be aware of medication side effects. Some common medicines make bones weaker. These include a type of steroid drug called glucocorticoids used for arthritis and asthma, some antiseizure drugs, certain sleeping pills, treatments for endometriosis, and some cancer drugs. An overactive thyroid gland or using too much thyroid hormone for an underactive thyroid can also be a problem. If you are taking these medicines, talk to your doctor about what you can do to help protect your bones.reduce the rate of osteoporosis.    Menopause can be associated with physical symptoms and risks. Hormone replacement therapy is available to decrease symptoms and risks. You should talk to your  health care provider   about whether hormone replacement therapy is right for you.   Use sunscreen. Apply sunscreen liberally and repeatedly throughout the day. You should seek shade when your shadow is shorter than you. Protect yourself by wearing long sleeves, pants, a wide-brimmed hat, and sunglasses year round, whenever you are outdoors.   Once a month, do a whole body skin exam, using a mirror to look at the skin on your back. Tell your health care provider of new moles, moles that have irregular borders, moles that are larger than a pencil eraser, or moles that have changed in shape or color.   -Stay current with required vaccines (immunizations).   Influenza vaccine. All adults should be immunized every year.  Tetanus, diphtheria, and acellular pertussis (Td, Tdap) vaccine. Pregnant women should receive 1 dose of Tdap vaccine during each pregnancy. The dose should be obtained regardless of the length of time since the last dose. Immunization is preferred during the 27th 36th week of gestation. An adult who has not previously received Tdap or who does not know her vaccine status should receive 1 dose of Tdap. This initial dose should be followed by tetanus and diphtheria toxoids (Td) booster doses every 10 years. Adults with an unknown or incomplete history of completing a 3-dose immunization series with Td-containing vaccines should begin or complete a primary immunization series including a Tdap dose. Adults should receive a Td booster every 10 years.  Varicella vaccine. An adult without evidence of immunity to varicella should receive 2 doses or a second dose if she has previously received 1 dose. Pregnant females who do not have evidence of immunity should receive the first dose after pregnancy. This first dose should be obtained before leaving the health care facility. The second dose should be obtained 4 8 weeks after the first dose.  Human papillomavirus (HPV) vaccine. Females aged 13 26  years who have not received the vaccine previously should obtain the 3-dose series. The vaccine is not recommended for use in pregnant females. However, pregnancy testing is not needed before receiving a dose. If a female is found to be pregnant after receiving a dose, no treatment is needed. In that case, the remaining doses should be delayed until after the pregnancy. Immunization is recommended for any person with an immunocompromised condition through the age of 26 years if she did not get any or all doses earlier. During the 3-dose series, the second dose should be obtained 4 8 weeks after the first dose. The third dose should be obtained 24 weeks after the first dose and 16 weeks after the second dose.  Zoster vaccine. One dose is recommended for adults aged 60 years or older unless certain conditions are present.  Measles, mumps, and rubella (MMR) vaccine. Adults born before 1957 generally are considered immune to measles and mumps. Adults born in 1957 or later should have 1 or more doses of MMR vaccine unless there is a contraindication to the vaccine or there is laboratory evidence of immunity to each of the three diseases. A routine second dose of MMR vaccine should be obtained at least 28 days after the first dose for students attending postsecondary schools, health care workers, or international travelers. People who received inactivated measles vaccine or an unknown type of measles vaccine during 1963 1967 should receive 2 doses of MMR vaccine. People who received inactivated mumps vaccine or an unknown type of mumps vaccine before 1979 and are at high risk for mumps infection should consider immunization with 2 doses of   MMR vaccine. For females of childbearing age, rubella immunity should be determined. If there is no evidence of immunity, females who are not pregnant should be vaccinated. If there is no evidence of immunity, females who are pregnant should delay immunization until after pregnancy.  Unvaccinated health care workers born before [birth year] who lack laboratory evidence of measles, mumps, or rubella immunity or laboratory confirmation of disease should consider measles and mumps immunization with 2 doses of MMR vaccine or rubella immunization with 1 dose of MMR vaccine.  Pneumococcal 13-valent conjugate (PCV13) vaccine. When indicated, a person who is uncertain of her immunization history and has no record of immunization should receive the PCV13 vaccine. An adult aged 54 years or older who has certain medical conditions and has not been previously immunized should receive 1 dose of PCV13 vaccine. This PCV13 should be followed with a dose of pneumococcal polysaccharide (PPSV23) vaccine. The PPSV23 vaccine dose should be obtained at least 8 weeks after the dose of PCV13 vaccine. An adult aged 58 years or older who has certain medical conditions and previously received 1 or more doses of PPSV23 vaccine should receive 1 dose of PCV13. The PCV13 vaccine dose should be obtained 1 or more years after the last PPSV23 vaccine dose.  Pneumococcal polysaccharide (PPSV23) vaccine. When PCV13 is also indicated, PCV13 should be obtained first. All adults aged 58 years and older should be immunized. An adult younger than age 65 years who has certain medical conditions should be immunized. Any person who resides in a nursing home or long-term care facility should be immunized. An adult smoker should be immunized. People with an immunocompromised condition and certain other conditions should receive both PCV13 and PPSV23 vaccines. People with human immunodeficiency virus (HIV) infection should be immunized as soon as possible after diagnosis. Immunization during chemotherapy or radiation therapy should be avoided. Routine use of PPSV23 vaccine is not recommended for American Indians, [place] Natives, or people younger than 65 years unless there are medical conditions that require PPSV23 vaccine. When indicated,  people who have unknown immunization and have no record of immunization should receive PPSV23 vaccine. One-time revaccination 5 years after the first dose of PPSV23 is recommended for people aged 70 64 years who have chronic kidney failure, nephrotic syndrome, asplenia, or immunocompromised conditions. People who received 1 2 doses of PPSV23 before age 32 years should receive another dose of PPSV23 vaccine at age 96 years or later if at least 5 years have passed since the previous dose. Doses of PPSV23 are not needed for people immunized with PPSV23 at or after age 55 years.  Meningococcal vaccine. Adults with asplenia or persistent complement component deficiencies should receive 2 doses of quadrivalent meningococcal conjugate (MenACWY-D) vaccine. The doses should be obtained at least 2 months apart. Microbiologists working with certain meningococcal bacteria, [employer] recruits, people at risk during an outbreak, and people who travel to or live in countries with a high rate of meningitis should be immunized. A first-year college student up through age 58 years who is living in a residence hall should receive a dose if she did not receive a dose on or after her 16th birthday. Adults who have certain high-risk conditions should receive one or more doses of vaccine.  Hepatitis A vaccine. Adults who wish to be protected from this disease, have certain high-risk conditions, work with hepatitis A-infected animals, work in hepatitis A research labs, or travel to or work in countries with a high rate of hepatitis A should be  immunized. Adults who were previously unvaccinated and who anticipate close contact with an international adoptee during the first 60 days after arrival in the [place] from a country with a high rate of hepatitis A should be immunized.  Hepatitis B vaccine.  Adults who wish to be protected from this disease, have certain high-risk conditions, may be exposed to blood or other infectious  body fluids, are household contacts or sex partners of hepatitis B positive people, are clients or workers in certain care facilities, or travel to or work in countries with a high rate of hepatitis B should be immunized.  Haemophilus influenzae type b (Hib) vaccine. A previously unvaccinated person with asplenia or sickle cell disease or having a scheduled splenectomy should receive 1 dose of Hib vaccine. Regardless of previous immunization, a recipient of a hematopoietic stem cell transplant should receive a 3-dose series 6 12 months after her successful transplant. Hib vaccine is not recommended for adults with HIV infection.  Preventive Services / Frequency Ages 6 to 39years  Blood pressure check.** / Every 1 to 2 years.  Lipid and cholesterol check.** / Every 5 years beginning at age 39.  Clinical breast exam.** / Every 3 years for women in their 61s and 62s.  BRCA-related cancer risk assessment.** / For women who have family members with a BRCA-related cancer (breast, ovarian, tubal, or peritoneal cancers).  Pap test.** / Every 2 years from ages 47 through 85. Every 3 years starting at age 34 through age 12 or 74 with a history of 3 consecutive normal Pap tests.  HPV screening.** / Every 3 years from ages 46 through ages 43 to 54 with a history of 3 consecutive normal Pap tests.  Hepatitis C blood test.** / For any individual with known risks for hepatitis C.  Skin self-exam. / Monthly.  Influenza vaccine. / Every year.  Tetanus, diphtheria, and acellular pertussis (Tdap, Td) vaccine.** / Consult your health care provider. Pregnant women should receive 1 dose of Tdap vaccine during each pregnancy. 1 dose of Td every 10 years.  Varicella vaccine.** / Consult your health care provider. Pregnant females who do not have evidence of immunity should receive the first dose after pregnancy.  HPV vaccine. / 3 doses over 6 months, if 64 and younger. The vaccine is not recommended for use in  pregnant females. However, pregnancy testing is not needed before receiving a dose.  Measles, mumps, rubella (MMR) vaccine.** / You need at least 1 dose of MMR if you were born in 1957 or later. You may also need a 2nd dose. For females of childbearing age, rubella immunity should be determined. If there is no evidence of immunity, females who are not pregnant should be vaccinated. If there is no evidence of immunity, females who are pregnant should delay immunization until after pregnancy.  Pneumococcal 13-valent conjugate (PCV13) vaccine.** / Consult your health care provider.  Pneumococcal polysaccharide (PPSV23) vaccine.** / 1 to 2 doses if you smoke cigarettes or if you have certain conditions.  Meningococcal vaccine.** / 1 dose if you are age 71 to 37 years and a Market researcher living in a residence hall, or have one of several medical conditions, you need to get vaccinated against meningococcal disease. You may also need additional booster doses.  Hepatitis A vaccine.** / Consult your health care provider.  Hepatitis B vaccine.** / Consult your health care provider.  Haemophilus influenzae type b (Hib) vaccine.** / Consult your health care provider.  Ages 55 to 64years  Blood pressure check.** / Every 1 to 2 years.  Lipid and cholesterol check.** / Every 5 years beginning at age 20 years.  Lung cancer screening. / Every year if you are aged 55 80 years and have a 30-pack-year history of smoking and currently smoke or have quit within the past 15 years. Yearly screening is stopped once you have quit smoking for at least 15 years or develop a health problem that would prevent you from having lung cancer treatment.  Clinical breast exam.** / Every year after age 40 years.  BRCA-related cancer risk assessment.** / For women who have family members with a BRCA-related cancer (breast, ovarian, tubal, or peritoneal cancers).  Mammogram.** / Every year beginning at age 40  years and continuing for as long as you are in good health. Consult with your health care provider.  Pap test.** / Every 3 years starting at age 30 years through age 65 or 70 years with a history of 3 consecutive normal Pap tests.  HPV screening.** / Every 3 years from ages 30 years through ages 65 to 70 years with a history of 3 consecutive normal Pap tests.  Fecal occult blood test (FOBT) of stool. / Every year beginning at age 50 years and continuing until age 75 years. You may not need to do this test if you get a colonoscopy every 10 years.  Flexible sigmoidoscopy or colonoscopy.** / Every 5 years for a flexible sigmoidoscopy or every 10 years for a colonoscopy beginning at age 50 years and continuing until age 75 years.  Hepatitis C blood test.** / For all people born from 1945 through 1965 and any individual with known risks for hepatitis C.  Skin self-exam. / Monthly.  Influenza vaccine. / Every year.  Tetanus, diphtheria, and acellular pertussis (Tdap/Td) vaccine.** / Consult your health care provider. Pregnant women should receive 1 dose of Tdap vaccine during each pregnancy. 1 dose of Td every 10 years.  Varicella vaccine.** / Consult your health care provider. Pregnant females who do not have evidence of immunity should receive the first dose after pregnancy.  Zoster vaccine.** / 1 dose for adults aged 60 years or older.  Measles, mumps, rubella (MMR) vaccine.** / You need at least 1 dose of MMR if you were born in 1957 or later. You may also need a 2nd dose. For females of childbearing age, rubella immunity should be determined. If there is no evidence of immunity, females who are not pregnant should be vaccinated. If there is no evidence of immunity, females who are pregnant should delay immunization until after pregnancy.  Pneumococcal 13-valent conjugate (PCV13) vaccine.** / Consult your health care provider.  Pneumococcal polysaccharide (PPSV23) vaccine.** / 1 to 2 doses if  you smoke cigarettes or if you have certain conditions.  Meningococcal vaccine.** / Consult your health care provider.  Hepatitis A vaccine.** / Consult your health care provider.  Hepatitis B vaccine.** / Consult your health care provider.  Haemophilus influenzae type b (Hib) vaccine.** / Consult your health care provider.  Ages 65 years and over  Blood pressure check.** / Every 1 to 2 years.  Lipid and cholesterol check.** / Every 5 years beginning at age 20 years.  Lung cancer screening. / Every year if you are aged 55 80 years and have a 30-pack-year history of smoking and currently smoke or have quit within the past 15 years. Yearly screening is stopped once you have quit smoking for at least 15 years or develop a health problem that   would prevent you from having lung cancer treatment.  Clinical breast exam.** / Every year after age 103 years.  BRCA-related cancer risk assessment.** / For women who have family members with a BRCA-related cancer (breast, ovarian, tubal, or peritoneal cancers).  Mammogram.** / Every year beginning at age 36 years and continuing for as long as you are in good health. Consult with your health care provider.  Pap test.** / Every 3 years starting at age 5 years through age 85 or 10 years with 3 consecutive normal Pap tests. Testing can be stopped between 65 and 70 years with 3 consecutive normal Pap tests and no abnormal Pap or HPV tests in the past 10 years.  HPV screening.** / Every 3 years from ages 93 years through ages 70 or 45 years with a history of 3 consecutive normal Pap tests. Testing can be stopped between 65 and 70 years with 3 consecutive normal Pap tests and no abnormal Pap or HPV tests in the past 10 years.  Fecal occult blood test (FOBT) of stool. / Every year beginning at age 8 years and continuing until age 45 years. You may not need to do this test if you get a colonoscopy every 10 years.  Flexible sigmoidoscopy or colonoscopy.** /  Every 5 years for a flexible sigmoidoscopy or every 10 years for a colonoscopy beginning at age 69 years and continuing until age 68 years.  Hepatitis C blood test.** / For all people born from [birth year] through 1963 and any individual with known risks for hepatitis C.  Osteoporosis screening.** / A one-time screening for women ages 7 years and over and women at risk for fractures or osteoporosis.  Skin self-exam. / Monthly.  Influenza vaccine. / Every year.  Tetanus, diphtheria, and acellular pertussis (Tdap/Td) vaccine.** / 1 dose of Td every 10 years.  Varicella vaccine.** / Consult your health care provider.  Zoster vaccine.** / 1 dose for adults aged 5 years or older.  Pneumococcal 13-valent conjugate (PCV13) vaccine.** / Consult your health care provider.  Pneumococcal polysaccharide (PPSV23) vaccine.** / 1 dose for all adults aged 74 years and older.  Meningococcal vaccine.** / Consult your health care provider.  Hepatitis A vaccine.** / Consult your health care provider.  Hepatitis B vaccine.** / Consult your health care provider.  Haemophilus influenzae type b (Hib) vaccine.** / Consult your health care provider. ** Family history and personal history of risk and conditions may change your health care provider's recommendations. Document Released: 10/08/2001 Document Revised: 06/02/2013  [facility] Patient Information 2014 [place], [place].   EXERCISE AND DIET:  We recommended that you start or continue a regular exercise program for good health. Regular exercise means any activity that makes your heart beat faster and makes you sweat.  We recommend exercising at least 30 minutes per day at least 3 days a week, preferably 5.  We also recommend a diet low in fat and sugar / carbohydrates.  Inactivity, poor dietary choices and obesity can cause diabetes, heart attack, stroke, and kidney damage, among others.     ALCOHOL AND SMOKING:  Women should limit their alcohol intake to no  more than 7 drinks/beers/glasses of wine (combined, not each!) per week. Moderation of alcohol intake to this level decreases your risk of breast cancer and liver damage.  ( And of course, no recreational drugs are part of a healthy lifestyle.)  Also, you should not be smoking at all or even being exposed to second hand smoke. Most people know smoking can  cause cancer, and various heart and lung diseases, but did you know it also contributes to weakening of your bones?  Aging of your skin?  Yellowing of your teeth and nails?   CALCIUM AND VITAMIN D:  Adequate intake of calcium and Vitamin D are recommended.  The recommendations for exact amounts of these supplements seem to change often, but generally speaking 600 mg of calcium (either carbonate or citrate) and 800 units of Vitamin D per day seems prudent. Certain women may benefit from higher intake of Vitamin D.  If you are among these women, your doctor will have told you during your visit.     PAP SMEARS:  Pap smears, to check for cervical cancer or precancers,  have traditionally been done yearly, although recent scientific advances have shown that most women can have pap smears less often.  However, every woman still should have a physical exam from her gynecologist or primary care physician every year. It will include a breast check, inspection of the vulva and vagina to check for abnormal growths or skin changes, a visual exam of the cervix, and then an exam to evaluate the size and shape of the uterus and ovaries.  And after 65 years of age, a rectal exam is indicated to check for rectal cancers. We will also provide age appropriate advice regarding health maintenance, like when you should have certain vaccines, screening for sexually transmitted diseases, bone density testing, colonoscopy, mammograms, etc.    MAMMOGRAMS:  All women over 52 years old should have a yearly mammogram. Many facilities now offer a "3D" mammogram, which may cost  around $50 extra out of pocket. If possible,  we recommend you accept the option to have the 3D mammogram performed.  It both reduces the number of women who will be called back for extra views which then turn out to be normal, and it is better than the routine mammogram at detecting truly abnormal areas.     COLONOSCOPY:  Colonoscopy to screen for colon cancer is recommended for all women at age 62.  We know, you hate the idea of the prep.  We agree, BUT, having colon cancer and not knowing it is worse!!  Colon cancer so often starts as a polyp that can be seen and removed at colonscopy, which can quite literally save your life!  And if your first colonoscopy is normal and you have no family history of colon cancer, most women don't have to have it again for 10 years.  Once every ten years, you can do something that may end up saving your life, right?  We will be happy to help you get it scheduled when you are ready.  Be sure to check your insurance coverage so you understand how much it will cost.  It may be covered as a preventative service at no cost, but you should check your particular policy.   Continue drinking plenty of water and eating a heart healthy diet. Continue regular exercise, you are doing a GREAT job remaining active! Continue with Oncology as directed. Good luck with CT scan on Monday! Please schedule fasting lab appt. Recommend annual physical with fasting labs. HAPPY HOLIDAYS> NICE TO SEE YOU!

## 2018-08-06 NOTE — Assessment & Plan Note (Signed)
10/02/2016- The 10-year ASCVD risk score Mikey Bussing DC Jr., et al., 2013) is: 6.1%   Values used to calculate the score:     Age: 67 years     Sex: Female     Is Non-Hispanic African American: No     Diabetic: No     Tobacco smoker: No     Systolic Blood Pressure: 750 mmHg     Is BP treated: No     HDL Cholesterol: 68 mg/dL     Total Cholesterol: 282 mg/dL  LDL-182 She reports previously being statin intolerant, re: severe myalgia Instructed to schedule fasting lab appt ASAP

## 2018-08-06 NOTE — Assessment & Plan Note (Signed)
Followed by oncology Has CT scan schedule 08/10/18

## 2018-08-07 ENCOUNTER — Telehealth: Payer: Self-pay | Admitting: Adult Health

## 2018-08-07 DIAGNOSIS — Z23 Encounter for immunization: Secondary | ICD-10-CM

## 2018-08-07 NOTE — Telephone Encounter (Signed)
Patient had Zoster and pneumonia vaccine shot done at [facility] in [place] on 07/03/15. Patient wants to discuss with the nurse the next steps of what needs to happen to be up to date on these items. Please advise

## 2018-08-10 ENCOUNTER — Ambulatory Visit (HOSPITAL_COMMUNITY)
Admission: RE | Admit: 2018-08-10 | Discharge: 2018-08-10 | Disposition: A | Discharge status: Home / Self Care | Payer: Medicare Other | Source: Ambulatory Visit | Attending: Oncology | Admitting: Oncology

## 2018-08-10 DIAGNOSIS — N281 Cyst of kidney, acquired: Secondary | ICD-10-CM | POA: Insufficient documentation

## 2018-08-10 DIAGNOSIS — Z902 Acquired absence of lung [part of]: Secondary | ICD-10-CM | POA: Diagnosis not present

## 2018-08-10 DIAGNOSIS — Z905 Acquired absence of kidney: Secondary | ICD-10-CM | POA: Insufficient documentation

## 2018-08-10 DIAGNOSIS — C649 Malignant neoplasm of unspecified kidney, except renal pelvis: Secondary | ICD-10-CM | POA: Diagnosis present

## 2018-08-10 MED ORDER — SODIUM CHLORIDE (PF) 0.9 % IJ SOLN
INTRAMUSCULAR | Status: AC
  Filled 2018-08-10: qty 50

## 2018-08-10 MED ORDER — IOHEXOL 300 MG/ML  SOLN
100.0000 mL | Freq: Once | INTRAMUSCULAR | Status: AC | PRN
  Administered 2018-08-10: 100 mL via INTRAVENOUS

## 2018-08-10 MED ORDER — PNEUMOCOCCAL VAC POLYVALENT 25 MCG/0.5ML IJ INJ: 0.5000 mL | INJECTION | INTRAMUSCULAR | 0 refills | Status: AC

## 2018-08-10 NOTE — Addendum Note (Signed)
Addended by: Fonnie Mu on: 08/10/2018 04:50 PM   Modules accepted: Orders

## 2018-08-10 NOTE — Telephone Encounter (Signed)
Discuss immunizations with pt. and advised her that she needs Pneumovax and Shingrix.  Pt requests RXs sent to pharmacy for both of these and they were sent.  Charyl Bigger, CMA

## 2018-08-11 ENCOUNTER — Telehealth: Payer: Self-pay | Admitting: *Deleted

## 2018-08-11 NOTE — Telephone Encounter (Signed)
-----   Message from Wyatt Portela, MD sent at 08/11/2018  8:36 AM EST ----- Please let her know her CT is normal.

## 2018-08-11 NOTE — Telephone Encounter (Signed)
Spoke with patient. Per dr Alen Blew, last CT scan was normal.

## 2018-08-12 ENCOUNTER — Other Ambulatory Visit: Payer: Medicare Other

## 2018-08-12 DIAGNOSIS — E785 Hyperlipidemia, unspecified: Secondary | ICD-10-CM

## 2018-08-12 DIAGNOSIS — Z1239 Encounter for other screening for malignant neoplasm of breast: Secondary | ICD-10-CM

## 2018-08-12 DIAGNOSIS — Z0000 Encounter for general adult medical examination without abnormal findings: Secondary | ICD-10-CM

## 2018-08-13 LAB — CBC WITH DIFFERENTIAL/PLATELET
BASOS ABS: 0.1 10*3/uL (ref 0.0–0.2)
Basos: 2 %
EOS (ABSOLUTE): 0.3 10*3/uL (ref 0.0–0.4)
Eos: 6 %
Hematocrit: 38.6 % (ref 34.0–46.6)
Hemoglobin: 12.9 g/dL (ref 11.1–15.9)
IMMATURE GRANULOCYTES: 0 %
Immature Grans (Abs): 0 10*3/uL (ref 0.0–0.1)
Lymphocytes Absolute: 2.2 10*3/uL (ref 0.7–3.1)
Lymphs: 40 %
MCH: 32.4 pg (ref 26.6–33.0)
MCHC: 33.4 g/dL (ref 31.5–35.7)
MCV: 97 fL (ref 79–97)
MONOS ABS: 0.5 10*3/uL (ref 0.1–0.9)
Monocytes: 10 %
NEUTROS PCT: 42 %
Neutrophils Absolute: 2.3 10*3/uL (ref 1.4–7.0)
PLATELETS: 325 10*3/uL (ref 150–450)
RBC: 3.98 x10E6/uL (ref 3.77–5.28)
RDW: 12.7 % (ref 12.3–15.4)
WBC: 5.4 10*3/uL (ref 3.4–10.8)

## 2018-08-13 LAB — COMPREHENSIVE METABOLIC PANEL
A/G RATIO: 1.8 (ref 1.2–2.2)
ALK PHOS: 58 IU/L (ref 39–117)
ALT: 18 IU/L (ref 0–32)
AST: 18 IU/L (ref 0–40)
Albumin: 4.1 g/dL (ref 3.6–4.8)
BUN/Creatinine Ratio: 16 (ref 12–28)
BUN: 16 mg/dL (ref 8–27)
Bilirubin Total: 0.3 mg/dL (ref 0.0–1.2)
CALCIUM: 9.8 mg/dL (ref 8.7–10.3)
CHLORIDE: 103 mmol/L (ref 96–106)
CO2: 24 mmol/L (ref 20–29)
Creatinine, Ser: 1 mg/dL (ref 0.57–1.00)
GFR calc Af Amer: 68 mL/min/{1.73_m2} (ref >59)
GFR, EST NON AFRICAN AMERICAN: 59 mL/min/{1.73_m2} — AB (ref >59)
Globulin, Total: 2.3 g/dL (ref 1.5–4.5)
Glucose: 112 mg/dL — ABNORMAL HIGH (ref 65–99)
POTASSIUM: 4.8 mmol/L (ref 3.5–5.2)
Sodium: 139 mmol/L (ref 134–144)
Total Protein: 6.4 g/dL (ref 6.0–8.5)

## 2018-08-13 LAB — LIPID PANEL
CHOL/HDL RATIO: 4 ratio (ref 0.0–4.4)
Cholesterol, Total: 252 mg/dL — ABNORMAL HIGH (ref 100–199)
HDL: 63 mg/dL (ref >39)
LDL Calculated: 174 mg/dL — ABNORMAL HIGH (ref 0–99)
Triglycerides: 76 mg/dL (ref 0–149)
VLDL Cholesterol Cal: 15 mg/dL (ref 5–40)

## 2018-08-13 LAB — HEMOGLOBIN A1C
ESTIMATED AVERAGE GLUCOSE: 114 mg/dL
HEMOGLOBIN A1C: 5.6 % (ref 4.8–5.6)

## 2018-08-13 LAB — TSH: TSH: 1.24 u[IU]/mL (ref 0.450–4.500)

## 2018-09-15 ENCOUNTER — Ambulatory Visit
Admission: RE | Admit: 2018-09-15 | Discharge: 2018-09-15 | Disposition: A | Discharge status: Home / Self Care | Payer: Medicare Other | Source: Ambulatory Visit | Attending: Adult Health | Admitting: Adult Health

## 2018-09-15 ENCOUNTER — Telehealth: Payer: Self-pay | Admitting: Oncology

## 2018-09-15 ENCOUNTER — Other Ambulatory Visit: Payer: Self-pay | Admitting: Adult Health

## 2018-09-15 DIAGNOSIS — Z1239 Encounter for other screening for malignant neoplasm of breast: Secondary | ICD-10-CM

## 2018-09-15 NOTE — Telephone Encounter (Signed)
Patient called to reschedule  °

## 2019-01-29 ENCOUNTER — Other Ambulatory Visit: Payer: Medicare Other

## 2019-01-29 ENCOUNTER — Ambulatory Visit: Payer: Medicare Other | Admitting: Oncology

## 2019-02-09 ENCOUNTER — Encounter: Payer: Self-pay | Admitting: Oncology

## 2019-02-17 ENCOUNTER — Telehealth: Payer: Self-pay | Admitting: Oncology

## 2019-02-17 ENCOUNTER — Encounter: Payer: Self-pay | Admitting: Oncology

## 2019-02-17 ENCOUNTER — Other Ambulatory Visit: Payer: Self-pay

## 2019-02-17 ENCOUNTER — Inpatient Hospital Stay: Payer: Medicare Other | Attending: Oncology

## 2019-02-17 ENCOUNTER — Inpatient Hospital Stay: Payer: Medicare Other | Admitting: Oncology

## 2019-02-17 VITALS — BP 137/67 | HR 71 | Temp 98.5°F | Resp 17 | Ht 63.25 in | Wt 150.7 lb

## 2019-02-17 DIAGNOSIS — C649 Malignant neoplasm of unspecified kidney, except renal pelvis: Secondary | ICD-10-CM | POA: Insufficient documentation

## 2019-02-17 DIAGNOSIS — Z905 Acquired absence of kidney: Secondary | ICD-10-CM | POA: Diagnosis not present

## 2019-02-17 DIAGNOSIS — C7800 Secondary malignant neoplasm of unspecified lung: Secondary | ICD-10-CM | POA: Diagnosis not present

## 2019-02-17 LAB — CMP (CANCER CENTER ONLY)
ALT: 20 U/L (ref 0–44)
AST: 20 U/L (ref 15–41)
Albumin: 3.9 g/dL (ref 3.5–5.0)
Alkaline Phosphatase: 60 U/L (ref 38–126)
Anion gap: 8 (ref 5–15)
BUN: 14 mg/dL (ref 8–23)
CO2: 25 mmol/L (ref 22–32)
Calcium: 9.5 mg/dL (ref 8.9–10.3)
Chloride: 106 mmol/L (ref 98–111)
Creatinine: 1.04 mg/dL — ABNORMAL HIGH (ref 0.44–1.00)
GFR, Est AFR Am: >60 mL/min (ref >60)
GFR, Estimated: 56 mL/min — ABNORMAL LOW (ref >60)
Glucose, Bld: 126 mg/dL — ABNORMAL HIGH (ref 70–99)
Potassium: 4.3 mmol/L (ref 3.5–5.1)
Sodium: 139 mmol/L (ref 135–145)
Total Bilirubin: 0.5 mg/dL (ref 0.3–1.2)
Total Protein: 7.4 g/dL (ref 6.5–8.1)

## 2019-02-17 LAB — CBC WITH DIFFERENTIAL (CANCER CENTER ONLY)
Abs Immature Granulocytes: 0.01 10*3/uL (ref 0.00–0.07)
Basophils Absolute: 0.1 10*3/uL (ref 0.0–0.1)
Basophils Relative: 2 %
Eosinophils Absolute: 0.3 10*3/uL (ref 0.0–0.5)
Eosinophils Relative: 4 %
HCT: 41.8 % (ref 36.0–46.0)
Hemoglobin: 13.3 g/dL (ref 12.0–15.0)
Immature Granulocytes: 0 %
Lymphocytes Relative: 40 %
Lymphs Abs: 2.4 10*3/uL (ref 0.7–4.0)
MCH: 32.4 pg (ref 26.0–34.0)
MCHC: 31.8 g/dL (ref 30.0–36.0)
MCV: 102 fL — ABNORMAL HIGH (ref 80.0–100.0)
Monocytes Absolute: 0.6 10*3/uL (ref 0.1–1.0)
Monocytes Relative: 10 %
Neutro Abs: 2.7 10*3/uL (ref 1.7–7.7)
Neutrophils Relative %: 44 %
Platelet Count: 310 10*3/uL (ref 150–400)
RBC: 4.1 MIL/uL (ref 3.87–5.11)
RDW: 13.9 % (ref 11.5–15.5)
WBC Count: 6 10*3/uL (ref 4.0–10.5)
nRBC: 0 % (ref 0.0–0.2)

## 2019-02-17 NOTE — Progress Notes (Signed)
Hematology and Oncology Follow Up Visit  Jenny Soto [id number] 1951-04-03 68 y.o. 02/17/2019 9:43 AM  Jenny Soto, M.D.  Jenny Soto, M.D.  Jenny Amass, MD    Principle Diagnosis: 68 year old woman with renal cell carcinoma with a stage IV disease that is undetectable at this time.  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    Prior Therapy:  1. Status post left nephrectomy in 2006.   2. The patient underwent surgical resection, VATS procedure and wedge resection of the right lower lobe done in 2009.  The pathology showed recurrence of her original renal cell carcinoma. 3. She is status post cryoablation of a right renal mass in February 2019.  Current therapy: Active surveillance.  Interim History: Jenny Soto is here for a follow-up.  Since the last visit, she reports no major changes in her health.  She denies any recent hospitalizations or illnesses.  She denies any abdominal pain or flank pain.  She continues to be independent and attends activities of daily living.  [place] periodically to this area for her medical care.  She denies any hematuria or flank pain.   She denied any alteration mental status, neuropathy, confusion or dizziness.  Denies any headaches or lethargy.  Denies any night sweats, weight loss or changes in appetite.  Denied orthopnea, dyspnea on exertion or chest discomfort.  Denies shortness of breath, difficulty breathing hemoptysis or cough.  Denies any abdominal distention, nausea, early satiety or dyspepsia.  Denies any hematuria, frequency, dysuria or nocturia.  Denies any skin irritation, dryness or rash.  Denies any ecchymosis or petechiae.  Denies any lymphadenopathy or clotting.  Denies any heat or cold intolerance.  Denies any anxiety or depression.  Remaining review of system is negative.     Medications: I have reviewed the patient's  current medications. No current outpatient medications on file.  Allergies:  Allergies  Allergen Reactions  . Oxycodone-Acetaminophen Nausea And Vomiting    Past Medical History, Surgical history, Social history, and Family History discussed today and unchanged.  Physical examination Blood pressure 137/67, pulse 71, temperature 98.5 F (36.9 C), temperature source Oral, resp. rate 17, height 5' 3.25" (1.607 m), weight 150 lb 11.2 oz (68.4 kg), SpO2 100 %.    ECOG: 0     General appearance: Alert, awake without any distress. Head: Atraumatic without abnormalities Oropharynx: Without any thrush or ulcers. Eyes: No scleral icterus. Lymph nodes: No lymphadenopathy noted in the cervical, supraclavicular, or axillary nodes Heart:regular rate and rhythm, without any murmurs or gallops.   Lung: Clear to auscultation without any rhonchi, wheezes or dullness to percussion. Abdomin: Soft, nontender without any shifting dullness or ascites. Musculoskeletal: No clubbing or cyanosis. Neurological: No motor or sensory deficits. Skin: No rashes or lesions. Psychiatric: Mood and affect appeared normal.   Lab Results: Lab Results  Component Value Date   WBC 5.4 08/12/2018   HGB 12.9 08/12/2018   HCT 38.6 08/12/2018   MCV 97 08/12/2018   PLT 325 08/12/2018     Chemistry      Component Value Date/Time   NA 139 08/12/2018 0930   NA 140 08/08/2017 1122   K 4.8 08/12/2018 0930   K 4.5 08/08/2017 1122   CL 103 08/12/2018 0930   CL 105 11/11/2012 0956   CO2 24 08/12/2018 0930   CO2 23 08/08/2017 1122   BUN 16  08/12/2018 0930   BUN 13.8 08/08/2017 1122   CREATININE 1.00 08/12/2018 0930   CREATININE 1.07 (H) 05/01/2018 0932   CREATININE 1.0 08/08/2017 1122      Component Value Date/Time   CALCIUM 9.8 08/12/2018 0930   CALCIUM 10.0 08/08/2017 1122   ALKPHOS 58 08/12/2018 0930   ALKPHOS 74 08/08/2017 1122   AST 18 08/12/2018 0930   AST 22 05/01/2018 0932   AST 19 08/08/2017  1122   ALT 18 08/12/2018 0930   ALT 21 05/01/2018 0932   ALT 19 08/08/2017 1122   BILITOT 0.3 08/12/2018 0930   BILITOT 0.7 05/01/2018 0932   BILITOT 0.48 08/08/2017 1122      Impression and plan:  68 year old woman with  1.  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    She continues to be on active surveillance at this time without any evidence of relapsed disease.  Risks and benefits of continuing active surveillance at this time was discussed and the plan is to repeat imaging studies in the near future to update her staging.  After discussion today, we have decided to repeat imaging studies in December and an annual basis after that.  2.  Right kidney neoplasm: No evidence of relapse after ablation completed 2019.  3.  Retroperitoneal lymph node enlargement: Resolved at this time without any evidence of relapse.  Appears to be reactive in nature.  4.  Follow-up: We will be in December for repeat imaging studies.  15  minutes was spent with the patient face-to-face today.  More than 50% of time was spent on reviewing her disease status, treatment options and answering questions regarding future plan of care.    Jenny Button, MD 6/24/20209:43 AM

## 2019-02-17 NOTE — Telephone Encounter (Signed)
Gave patient contrast. Called and spoke with patient to confirm appt

## 2019-03-22 ENCOUNTER — Encounter: Payer: Self-pay | Admitting: Oncology

## 2019-06-19 ENCOUNTER — Encounter: Payer: Self-pay | Admitting: Oncology

## 2019-06-22 ENCOUNTER — Telehealth: Payer: Self-pay

## 2019-07-21 ENCOUNTER — Telehealth: Payer: Self-pay

## 2019-07-21 ENCOUNTER — Encounter: Payer: Self-pay | Admitting: Oncology

## 2019-07-21 NOTE — Telephone Encounter (Signed)
Received message from patient with influenza vaccination information and updated her record.

## 2019-07-28 ENCOUNTER — Ambulatory Visit (HOSPITAL_COMMUNITY)
Admission: RE | Admit: 2019-07-28 | Discharge: 2019-07-28 | Disposition: A | Discharge status: Home / Self Care | Payer: Medicare Other | Source: Ambulatory Visit | Attending: Oncology | Admitting: Oncology

## 2019-07-28 ENCOUNTER — Other Ambulatory Visit: Payer: Self-pay

## 2019-07-28 ENCOUNTER — Inpatient Hospital Stay: Payer: Medicare Other | Attending: Oncology

## 2019-07-28 ENCOUNTER — Encounter (HOSPITAL_COMMUNITY): Payer: Self-pay

## 2019-07-28 DIAGNOSIS — J984 Other disorders of lung: Secondary | ICD-10-CM | POA: Diagnosis not present

## 2019-07-28 DIAGNOSIS — R911 Solitary pulmonary nodule: Secondary | ICD-10-CM | POA: Insufficient documentation

## 2019-07-28 DIAGNOSIS — K5730 Diverticulosis of large intestine without perforation or abscess without bleeding: Secondary | ICD-10-CM | POA: Diagnosis not present

## 2019-07-28 DIAGNOSIS — Z905 Acquired absence of kidney: Secondary | ICD-10-CM | POA: Insufficient documentation

## 2019-07-28 DIAGNOSIS — C649 Malignant neoplasm of unspecified kidney, except renal pelvis: Secondary | ICD-10-CM

## 2019-07-28 DIAGNOSIS — Z9081 Acquired absence of spleen: Secondary | ICD-10-CM | POA: Diagnosis not present

## 2019-07-28 DIAGNOSIS — D259 Leiomyoma of uterus, unspecified: Secondary | ICD-10-CM | POA: Insufficient documentation

## 2019-07-28 DIAGNOSIS — I700 Atherosclerosis of aorta: Secondary | ICD-10-CM | POA: Insufficient documentation

## 2019-07-28 DIAGNOSIS — C642 Malignant neoplasm of left kidney, except renal pelvis: Secondary | ICD-10-CM | POA: Diagnosis not present

## 2019-07-28 LAB — CMP (CANCER CENTER ONLY)
ALT: 24 U/L (ref 0–44)
AST: 23 U/L (ref 15–41)
Albumin: 4.1 g/dL (ref 3.5–5.0)
Alkaline Phosphatase: 66 U/L (ref 38–126)
Anion gap: 12 (ref 5–15)
BUN: 17 mg/dL (ref 8–23)
CO2: 21 mmol/L — ABNORMAL LOW (ref 22–32)
Calcium: 9.9 mg/dL (ref 8.9–10.3)
Chloride: 106 mmol/L (ref 98–111)
Creatinine: 1.04 mg/dL — ABNORMAL HIGH (ref 0.44–1.00)
GFR, Est AFR Am: >60 mL/min (ref >60)
GFR, Estimated: 56 mL/min — ABNORMAL LOW (ref >60)
Glucose, Bld: 124 mg/dL — ABNORMAL HIGH (ref 70–99)
Potassium: 4.7 mmol/L (ref 3.5–5.1)
Sodium: 139 mmol/L (ref 135–145)
Total Bilirubin: 0.4 mg/dL (ref 0.3–1.2)
Total Protein: 7.6 g/dL (ref 6.5–8.1)

## 2019-07-28 LAB — CBC WITH DIFFERENTIAL (CANCER CENTER ONLY)
Abs Immature Granulocytes: 0.02 10*3/uL (ref 0.00–0.07)
Basophils Absolute: 0.1 10*3/uL (ref 0.0–0.1)
Basophils Relative: 1 %
Eosinophils Absolute: 0.3 10*3/uL (ref 0.0–0.5)
Eosinophils Relative: 3 %
HCT: 41.9 % (ref 36.0–46.0)
Hemoglobin: 13.7 g/dL (ref 12.0–15.0)
Immature Granulocytes: 0 %
Lymphocytes Relative: 29 %
Lymphs Abs: 2.6 10*3/uL (ref 0.7–4.0)
MCH: 32.8 pg (ref 26.0–34.0)
MCHC: 32.7 g/dL (ref 30.0–36.0)
MCV: 100.2 fL — ABNORMAL HIGH (ref 80.0–100.0)
Monocytes Absolute: 0.8 10*3/uL (ref 0.1–1.0)
Monocytes Relative: 9 %
Neutro Abs: 5.3 10*3/uL (ref 1.7–7.7)
Neutrophils Relative %: 58 %
Platelet Count: 300 10*3/uL (ref 150–400)
RBC: 4.18 MIL/uL (ref 3.87–5.11)
RDW: 13.2 % (ref 11.5–15.5)
WBC Count: 9.1 10*3/uL (ref 4.0–10.5)
nRBC: 0 % (ref 0.0–0.2)

## 2019-07-28 MED ORDER — IOHEXOL 300 MG/ML  SOLN
100.0000 mL | Freq: Once | INTRAMUSCULAR | Status: AC | PRN
  Administered 2019-07-28: 11:00:00 100 mL via INTRAVENOUS

## 2019-07-30 ENCOUNTER — Inpatient Hospital Stay (HOSPITAL_BASED_OUTPATIENT_CLINIC_OR_DEPARTMENT_OTHER): Payer: Medicare Other | Admitting: Oncology

## 2019-07-30 DIAGNOSIS — R911 Solitary pulmonary nodule: Secondary | ICD-10-CM | POA: Diagnosis not present

## 2019-07-30 NOTE — Progress Notes (Signed)
Hematology and Oncology Follow Up for Telemedicine Visits  Jenny Soto [id number] [birth year] 68 y.o. 07/30/2019 9:54 AM Danford, Berna Spare, NPDanford, Berna Spare, NP   I connected with [name] on 07/30/19 at 10:00 AM EST by telephone visit and verified that I am speaking with the correct person using two identifiers.   I discussed the limitations, risks, security and privacy concerns of performing an evaluation and management service by telemedicine and the availability of in-person appointments. I also discussed with the patient that there may be a patient responsible charge related to this service. The patient expressed understanding and agreed to proceed.  Other persons participating in the visit and their role in the encounter:  None  Patient's location: Home Provider's location: Office    Principle Diagnosis: 38 year old woman with kidney cancer diagnosed in 2006.  She had localized disease at the time and subsequently developed a stage IV disease in 2009.  She continues to have no active disease after surgically resecting any recurrence.  Prior Therapy:  1. Status post left nephrectomy in 2006.   2. The patient underwent surgical resection, VATS procedure and wedge resection of the right lower lobe done in 2009.  The pathology showed recurrence of her original renal cell carcinoma. 3. She is status post cryoablation of a right renal mass in February 2019.  Current therapy: Active surveillance.   Interim History: [name] reports feeling reasonably well without any recent complaints.  She has not had any recent hospitalizations or illnesses.  She denies any abdominal pain or respiratory complaints.  Her performance status and quality of life remains unchanged.  She denies any back pain or pathological fractures.  Her appetite remain excellent and denies any recent illnesses or infections.     Medications: I have reviewed the patient's current medications.  No current  outpatient medications on file.   No current facility-administered medications for this visit.      Allergies:  Allergies  Allergen Reactions  . Oxycodone-Acetaminophen Nausea And Vomiting    Past Medical History, Surgical history, Social history, and Family History updated without any changes.    Lab Results: Lab Results  Component Value Date   WBC 9.1 07/28/2019   HGB 13.7 07/28/2019   HCT 41.9 07/28/2019   MCV 100.2 (H) 07/28/2019   PLT 300 07/28/2019     Chemistry      Component Value Date/Time   NA 139 07/28/2019 0935   NA 139 08/12/2018 0930   NA 140 08/08/2017 1122   K 4.7 07/28/2019 0935   K 4.5 08/08/2017 1122   CL 106 07/28/2019 0935   CL 105 11/11/2012 0956   CO2 21 (L) 07/28/2019 0935   CO2 23 08/08/2017 1122   BUN 17 07/28/2019 0935   BUN 16 08/12/2018 0930   BUN 13.8 08/08/2017 1122   CREATININE 1.04 (H) 07/28/2019 0935   CREATININE 1.0 08/08/2017 1122      Component Value Date/Time   CALCIUM 9.9 07/28/2019 0935   CALCIUM 10.0 08/08/2017 1122   ALKPHOS 66 07/28/2019 0935   ALKPHOS 74 08/08/2017 1122   AST 23 07/28/2019 0935   AST 19 08/08/2017 1122   ALT 24 07/28/2019 0935   ALT 19 08/08/2017 1122   BILITOT 0.4 07/28/2019 0935   BILITOT 0.48 08/08/2017 1122       Radiological Studies: Ct Abdomen Pelvis W Wo Contrast  Result Date: 07/28/2019 CLINICAL DATA:  Metastatic left renal cancer restaging. Prior left nephrectomy, splenectomy, and right lower lobectomy. Prior  cryoablation of a right renal mass in 2019. EXAM: CT CHEST WITH CONTRAST CT ABDOMEN AND PELVIS WITH AND WITHOUT CONTRAST TECHNIQUE: Multidetector CT imaging of the chest was performed during intravenous contrast administration. Multidetector CT imaging of the abdomen and pelvis was performed following the standard protocol before and during bolus administration of intravenous contrast. CONTRAST:  168mL OMNIPAQUE IOHEXOL 300 MG/ML  SOLN COMPARISON:  Multiple exams, including  08/10/2018 and 01/30/2018 FINDINGS: CT CHEST FINDINGS Cardiovascular: Descending thoracic aortic atherosclerotic calcification. Mediastinum/Nodes: Unremarkable Lungs/Pleura: Mild biapical pleuroparenchymal scarring. New mixed density right upper lobe pulmonary nodule with a 0.8 by 0.6 cm solid component. Including the ground-glass density component this nodule measures 1.5 by 1.3 cm on image 47/15. Mild atelectasis inferiorly in the right middle lobe. Prior wedge resection in the right lower lobe. Minimal scarring laterally in the left upper lobe on image 53/15, stable. Musculoskeletal: Mild thoracic kyphosis. CT ABDOMEN AND PELVIS FINDINGS Hepatobiliary: Stable 4 mm hypodense lesion in the lateral segment left hepatic lobe on image 93/11. Stable 0.6 by 0.5 cm hypodense lesion anteriorly along the lateral segment left hepatic lobe on image 20/6. Stable 0.4 cm hypodense lesion in the left hepatic lobe on image 13/6. Cholecystectomy. No new liver lesion is identified. Pancreas: Stable rim calcified lesion of the pancreatic tail, likely benign. Spleen: Splenectomy with regenerative splenic tissue noted. Adrenals/Urinary Tract: Left oophorectomy. Stable scarring and hypoenhancement along the right kidney upper pole, with associated hypodensity mildly reduced in volume compared to previous, no compelling findings of recurrence, compatible with ablation site. Small adjacent hypodense lesion along the right kidney upper pole is probably a small cyst. There tiny cysts of the right kidney lower pole which are unchanged. Urinary bladder unremarkable. No urinary tract calculi are identified. Stomach/Bowel: Sigmoid colon diverticulosis. Vascular/Lymphatic: Aortoiliac atherosclerotic vascular disease. No pathologic adenopathy identified. Reproductive: Calcifications along the uterus not appreciably changed from prior, primarily along the myometrium with questionable calcification along the endometrium margins. Likely residua  from old fibroids. Other: No supplemental non-categorized findings. Musculoskeletal: Unremarkable IMPRESSION: 1. New 1.5 by 1.3 cm mixed density right upper lobe pulmonary nodule with a 0.8 by 0.6 cm solid component, concerning for potential malignancy given the patient's history. Inflammatory lesion is not excluded, and surveillance imaging of the chest is recommended. 2. No findings of recurrence along the right kidney upper pole. 3. Other imaging findings of potential clinical significance: Sigmoid colon diverticulosis. Calcified uterine fibroids. Splenectomy with regenerative splenic tissue. Several small hepatic lesions at or below 6 mm in diameter are stable and most likely benign. Aortic Atherosclerosis (ICD10-I70.0). Electronically Signed   By: Van Clines M.D.   On: 07/28/2019 12:47   Ct Chest W Contrast  Result Date: 07/28/2019 CLINICAL DATA:  Metastatic left renal cancer restaging. Prior left nephrectomy, splenectomy, and right lower lobectomy. Prior cryoablation of a right renal mass in 2019. EXAM: CT CHEST WITH CONTRAST CT ABDOMEN AND PELVIS WITH AND WITHOUT CONTRAST TECHNIQUE: Multidetector CT imaging of the chest was performed during intravenous contrast administration. Multidetector CT imaging of the abdomen and pelvis was performed following the standard protocol before and during bolus administration of intravenous contrast. CONTRAST:  152mL OMNIPAQUE IOHEXOL 300 MG/ML  SOLN COMPARISON:  Multiple exams, including 08/10/2018 and 01/30/2018 FINDINGS: CT CHEST FINDINGS Cardiovascular: Descending thoracic aortic atherosclerotic calcification. Mediastinum/Nodes: Unremarkable Lungs/Pleura: Mild biapical pleuroparenchymal scarring. New mixed density right upper lobe pulmonary nodule with a 0.8 by 0.6 cm solid component. Including the ground-glass density component this nodule measures 1.5 by 1.3 cm on  image 47/15. Mild atelectasis inferiorly in the right middle lobe. Prior wedge resection in  the right lower lobe. Minimal scarring laterally in the left upper lobe on image 53/15, stable. Musculoskeletal: Mild thoracic kyphosis. CT ABDOMEN AND PELVIS FINDINGS Hepatobiliary: Stable 4 mm hypodense lesion in the lateral segment left hepatic lobe on image 93/11. Stable 0.6 by 0.5 cm hypodense lesion anteriorly along the lateral segment left hepatic lobe on image 20/6. Stable 0.4 cm hypodense lesion in the left hepatic lobe on image 13/6. Cholecystectomy. No new liver lesion is identified. Pancreas: Stable rim calcified lesion of the pancreatic tail, likely benign. Spleen: Splenectomy with regenerative splenic tissue noted. Adrenals/Urinary Tract: Left oophorectomy. Stable scarring and hypoenhancement along the right kidney upper pole, with associated hypodensity mildly reduced in volume compared to previous, no compelling findings of recurrence, compatible with ablation site. Small adjacent hypodense lesion along the right kidney upper pole is probably a small cyst. There tiny cysts of the right kidney lower pole which are unchanged. Urinary bladder unremarkable. No urinary tract calculi are identified. Stomach/Bowel: Sigmoid colon diverticulosis. Vascular/Lymphatic: Aortoiliac atherosclerotic vascular disease. No pathologic adenopathy identified. Reproductive: Calcifications along the uterus not appreciably changed from prior, primarily along the myometrium with questionable calcification along the endometrium margins. Likely residua from old fibroids. Other: No supplemental non-categorized findings. Musculoskeletal: Unremarkable IMPRESSION: 1. New 1.5 by 1.3 cm mixed density right upper lobe pulmonary nodule with a 0.8 by 0.6 cm solid component, concerning for potential malignancy given the patient's history. Inflammatory lesion is not excluded, and surveillance imaging of the chest is recommended. 2. No findings of recurrence along the right kidney upper pole. 3. Other imaging findings of potential clinical  significance: Sigmoid colon diverticulosis. Calcified uterine fibroids. Splenectomy with regenerative splenic tissue. Several small hepatic lesions at or below 6 mm in diameter are stable and most likely benign. Aortic Atherosclerosis (ICD10-I70.0). Electronically Signed   By: Van Clines M.D.   On: 07/28/2019 12:47     Impression and Plan:  68 year old woman with  1.    Renal cell carcinoma diagnosed in 2006.  She subsequently developed stage IV disease in 2009 that was surgically resected and remains without any active disease.     CT scan obtained on 07/28/2019 was personally reviewed and discussed with the patient.  Her scan lab does show a small mixed right upper lobe pulmonary nodule measuring 1.5 x 1.3 cm with a 0.8 x 0.6 solid component.  Differential diagnosis of this nodule includes reactive inflammatory process versus metastatic disease.  Treatment options were reviewed at this time which includes short feet of observation and if this mass continues to grow surgical resection would be best course of action.  Alternatively proceeding with surgical resection at this time also be a possibility given her history and high likelihood of malignancy.  After discussion today, we will repeat imaging studies in 3 months and determine best course of action after that.  Surgical resection versus stereotactic body surgery would be also an alternative.  2.  Right kidney neoplasm:  No relapsed disease noted on recent CT scan.  3.  Retroperitoneal lymph node enlargement: No issues reported with resolution of her lymph node.  4.  Follow-up: In 3 months after the next CT scan.   I discussed the assessment and treatment plan with the patient. The patient was provided an opportunity to ask questions and all were answered. The patient agreed with the plan and demonstrated an understanding of the instructions.   The patient was  advised to call back or seek an in-person evaluation if the symptoms  worsen or if the condition fails to improve as anticipated.  I provided 20  minutes of non face-to-face telephone visit time during this encounter, and > 50% was dedicated to reviewing her disease status, imaging studies as well as coordinating future plan of care.  Zola Button, MD 07/30/2019 9:54 AM

## 2019-08-01 ENCOUNTER — Encounter: Payer: Self-pay | Admitting: Oncology

## 2019-08-02 ENCOUNTER — Telehealth: Payer: Self-pay | Admitting: Oncology

## 2019-08-02 NOTE — Telephone Encounter (Signed)
Scheduled appt per 12/4 los.  Spoke with pt and she is aware of her appt date and time.

## 2019-08-12 ENCOUNTER — Encounter: Payer: Self-pay | Admitting: Oncology

## 2019-08-15 ENCOUNTER — Encounter: Payer: Self-pay | Admitting: Adult Health

## 2019-09-10 ENCOUNTER — Encounter: Payer: Self-pay | Admitting: Oncology

## 2019-09-24 ENCOUNTER — Ambulatory Visit: Payer: Medicare PPO

## 2019-09-24 ENCOUNTER — Telehealth: Payer: Self-pay | Admitting: Adult Health

## 2019-09-24 NOTE — Telephone Encounter (Signed)
Patient called states is scheduled to rcv COVID vaccine next week & wants to schedule her annual CPE & FBW --  --Forwarding message to med asst no lab orders in pt's chart--- Lab appt scheduled for 10/01/2019 & CPE appt 10/04/2019.  --glh

## 2019-09-27 ENCOUNTER — Other Ambulatory Visit: Payer: Self-pay

## 2019-09-27 DIAGNOSIS — Z0000 Encounter for general adult medical examination without abnormal findings: Secondary | ICD-10-CM

## 2019-09-27 DIAGNOSIS — R5383 Other fatigue: Secondary | ICD-10-CM

## 2019-09-27 DIAGNOSIS — E785 Hyperlipidemia, unspecified: Secondary | ICD-10-CM

## 2019-09-27 NOTE — Telephone Encounter (Signed)
Orders entered.  Charyl Bigger, CMA

## 2019-09-30 ENCOUNTER — Ambulatory Visit: Payer: Medicare PPO | Attending: Internal Medicine

## 2019-09-30 DIAGNOSIS — Z23 Encounter for immunization: Secondary | ICD-10-CM | POA: Insufficient documentation

## 2019-09-30 NOTE — Progress Notes (Signed)
   Covid-19 Vaccination Clinic  Name:  Jenny Soto    MRN: [id number] DOB: June 04, 1951  09/30/2019  Jenny Soto was observed post Covid-19 immunization for 15 minutes without incidence. She was provided with Vaccine Information Sheet and instruction to access the V-Safe system.   Jenny Soto was instructed to call 911 with any severe reactions post vaccine: Marland Kitchen Difficulty breathing  . Swelling of your face and throat  . A fast heartbeat  . A bad rash all over your body  . Dizziness and weakness    Immunizations Administered    Name Date Dose VIS Date Route   Pfizer COVID-19 Vaccine 09/30/2019  8:25 AM 0.3 mL 08/06/2019 Intramuscular   Manufacturer: [place]   Lot: [id number]   [place]: [id number]

## 2019-10-01 ENCOUNTER — Other Ambulatory Visit: Payer: Self-pay

## 2019-10-01 ENCOUNTER — Other Ambulatory Visit: Payer: Medicare PPO

## 2019-10-01 DIAGNOSIS — E785 Hyperlipidemia, unspecified: Secondary | ICD-10-CM

## 2019-10-01 DIAGNOSIS — R5383 Other fatigue: Secondary | ICD-10-CM

## 2019-10-01 DIAGNOSIS — Z0000 Encounter for general adult medical examination without abnormal findings: Secondary | ICD-10-CM

## 2019-10-02 ENCOUNTER — Encounter: Payer: Self-pay | Admitting: Adult Health

## 2019-10-02 LAB — CBC WITH DIFFERENTIAL/PLATELET
Basophils Absolute: 0.1 10*3/uL (ref 0.0–0.2)
Basos: 2 %
EOS (ABSOLUTE): 0.3 10*3/uL (ref 0.0–0.4)
Eos: 5 %
Hematocrit: 42.5 % (ref 34.0–46.6)
Hemoglobin: 14.1 g/dL (ref 11.1–15.9)
Immature Grans (Abs): 0 10*3/uL (ref 0.0–0.1)
Immature Granulocytes: 0 %
Lymphocytes Absolute: 2.4 10*3/uL (ref 0.7–3.1)
Lymphs: 37 %
MCH: 32.3 pg (ref 26.6–33.0)
MCHC: 33.2 g/dL (ref 31.5–35.7)
MCV: 98 fL — ABNORMAL HIGH (ref 79–97)
Monocytes Absolute: 0.6 10*3/uL (ref 0.1–0.9)
Monocytes: 9 %
Neutrophils Absolute: 3.1 10*3/uL (ref 1.4–7.0)
Neutrophils: 47 %
Platelets: 308 10*3/uL (ref 150–450)
RBC: 4.36 x10E6/uL (ref 3.77–5.28)
RDW: 12.6 % (ref 11.7–15.4)
WBC: 6.6 10*3/uL (ref 3.4–10.8)

## 2019-10-02 LAB — COMPREHENSIVE METABOLIC PANEL
ALT: 19 IU/L (ref 0–32)
AST: 20 IU/L (ref 0–40)
Albumin/Globulin Ratio: 1.7 (ref 1.2–2.2)
Albumin: 4.5 g/dL (ref 3.8–4.8)
Alkaline Phosphatase: 77 IU/L (ref 39–117)
BUN/Creatinine Ratio: 11 — ABNORMAL LOW (ref 12–28)
BUN: 12 mg/dL (ref 8–27)
Bilirubin Total: 0.5 mg/dL (ref 0.0–1.2)
CO2: 21 mmol/L (ref 20–29)
Calcium: 9.9 mg/dL (ref 8.7–10.3)
Chloride: 104 mmol/L (ref 96–106)
Creatinine, Ser: 1.06 mg/dL — ABNORMAL HIGH (ref 0.57–1.00)
GFR calc Af Amer: 63 mL/min/{1.73_m2} (ref >59)
GFR calc non Af Amer: 55 mL/min/{1.73_m2} — ABNORMAL LOW (ref >59)
Globulin, Total: 2.6 g/dL (ref 1.5–4.5)
Glucose: 110 mg/dL — ABNORMAL HIGH (ref 65–99)
Potassium: 4.7 mmol/L (ref 3.5–5.2)
Sodium: 140 mmol/L (ref 134–144)
Total Protein: 7.1 g/dL (ref 6.0–8.5)

## 2019-10-02 LAB — LIPID PANEL
Chol/HDL Ratio: 3.7 ratio (ref 0.0–4.4)
Cholesterol, Total: 275 mg/dL — ABNORMAL HIGH (ref 100–199)
HDL: 74 mg/dL (ref >39)
LDL Chol Calc (NIH): 182 mg/dL — ABNORMAL HIGH (ref 0–99)
Triglycerides: 112 mg/dL (ref 0–149)
VLDL Cholesterol Cal: 19 mg/dL (ref 5–40)

## 2019-10-02 LAB — HEMOGLOBIN A1C
Est. average glucose Bld gHb Est-mCnc: 120 mg/dL
Hgb A1c MFr Bld: 5.8 % — ABNORMAL HIGH (ref 4.8–5.6)

## 2019-10-02 LAB — TSH: TSH: 1.32 u[IU]/mL (ref 0.450–4.500)

## 2019-10-04 ENCOUNTER — Encounter: Payer: Medicare PPO | Admitting: Adult Health

## 2019-10-04 ENCOUNTER — Other Ambulatory Visit: Payer: Self-pay | Admitting: Adult Health

## 2019-10-04 MED ORDER — ROSUVASTATIN CALCIUM 10 MG PO TABS: 10.0000 mg | ORAL_TABLET | Freq: Every day | ORAL | 0 refills | Status: DC

## 2019-10-05 ENCOUNTER — Other Ambulatory Visit: Payer: Self-pay

## 2019-10-05 ENCOUNTER — Telehealth: Payer: Self-pay | Admitting: Adult Health

## 2019-10-05 ENCOUNTER — Ambulatory Visit: Payer: Medicare Other

## 2019-10-05 DIAGNOSIS — E785 Hyperlipidemia, unspecified: Secondary | ICD-10-CM

## 2019-10-05 DIAGNOSIS — Z79899 Other long term (current) drug therapy: Secondary | ICD-10-CM

## 2019-10-05 MED ORDER — ROSUVASTATIN CALCIUM 10 MG PO TABS: 10.0000 mg | ORAL_TABLET | ORAL | 0 refills | Status: DC

## 2019-10-05 NOTE — Telephone Encounter (Signed)
10/05/2019  Spoke with pt and advised her that she is to take the Crestor twice weekly only.  Directions corrected in pt's chart.  Also, pt will be in [facility] prior to repeat labs needed and will not be returning until November.  Printed [facility] requisitions for her to take with her out of town to have her labs drawn while she is there.  Also, printed pt a location for a [facility] draw station in [place].  Charyl Bigger, CMA

## 2019-10-05 NOTE — Telephone Encounter (Signed)
Patient called says instructions given over phone yesterday 2/8 different on Rx bottle picked up from pharmacy--- Needs clarification on how to take medication.  (Per patient she was told to take 1 pill two times weekly)  & Rx would last 12 weeks and then a Telehealth or OV would be required.)  --- But instructions on Rx bottle reads----   rosuvastatin (CRESTOR) 10 MG tablet [id number]   Order Details Dose: 10 mg Route: Oral Frequency: Daily  Dispense Quantity: 24 tablet Refills: 0       Sig: Take 1 tablet (10 mg total) by mouth daily.   --Forwarding message to med asst to contact patient with clarification.  -glh

## 2019-10-07 ENCOUNTER — Ambulatory Visit: Payer: Medicare Other

## 2019-10-19 ENCOUNTER — Ambulatory Visit: Payer: Medicare Other

## 2019-10-20 ENCOUNTER — Ambulatory Visit: Payer: Medicare Other

## 2019-10-22 ENCOUNTER — Other Ambulatory Visit: Payer: Self-pay | Admitting: Adult Health

## 2019-10-25 ENCOUNTER — Ambulatory Visit: Payer: Medicare PPO | Attending: Internal Medicine

## 2019-10-25 DIAGNOSIS — Z23 Encounter for immunization: Secondary | ICD-10-CM | POA: Insufficient documentation

## 2019-10-25 NOTE — Progress Notes (Signed)
   Covid-19 Vaccination Clinic  Name:  Jenny Soto    MRN: [id number] DOB: [birth year]  10/25/2019  [name] was observed post Covid-19 immunization for 15 minutes without incidence. She was provided with Vaccine Information Sheet and instruction to access the V-Safe system.   [name] was instructed to call 911 with any severe reactions post vaccine: Marland Kitchen Difficulty breathing  . Swelling of your face and throat  . A fast heartbeat  . A bad rash all over your body  . Dizziness and weakness    Immunizations Administered    Name Date Dose VIS Date Route   Pfizer COVID-19 Vaccine 10/25/2019 12:43 PM 0.3 mL 08/06/2019 Intramuscular   Manufacturer: [place]   Lot: [id number]   [place]: [id number]

## 2019-10-26 ENCOUNTER — Other Ambulatory Visit: Payer: Self-pay

## 2019-10-26 ENCOUNTER — Ambulatory Visit (HOSPITAL_COMMUNITY)
Admission: RE | Admit: 2019-10-26 | Discharge: 2019-10-26 | Disposition: A | Discharge status: Home / Self Care | Payer: Medicare PPO | Source: Ambulatory Visit | Attending: Oncology | Admitting: Oncology

## 2019-10-26 DIAGNOSIS — R911 Solitary pulmonary nodule: Secondary | ICD-10-CM | POA: Insufficient documentation

## 2019-10-28 ENCOUNTER — Inpatient Hospital Stay: Payer: Medicare PPO | Attending: Oncology | Admitting: Oncology

## 2019-10-28 DIAGNOSIS — C649 Malignant neoplasm of unspecified kidney, except renal pelvis: Secondary | ICD-10-CM

## 2019-10-28 DIAGNOSIS — R911 Solitary pulmonary nodule: Secondary | ICD-10-CM

## 2019-10-28 NOTE — Progress Notes (Signed)
Hematology and Oncology Follow Up for Telemedicine Visits  JOSSILYN STUBBS [id number] [birth year] 69 y.o. 10/28/2019 9:04 AM Danford, Berna Spare, NPDanford, Berna Spare, NP   I connected with Ms. Deckert on 10/28/19 at 10:00 AM EST by telephone visit and verified that I am speaking with the correct person using two identifiers.   I discussed the limitations, risks, security and privacy concerns of performing an evaluation and management service by telemedicine and the availability of in-person appointments. I also discussed with the patient that there may be a patient responsible charge related to this service. The patient expressed understanding and agreed to proceed.  Other persons participating in the visit and their role in the encounter:  None  Patient's location: Home Provider's location: Office    Principle Diagnosis: 69 year old woman with stage IV renal cell carcinoma without any active disease with initial diagnosis in left kidney in 2006.    Prior Therapy:  1. Status post left nephrectomy in 2006.   2. The patient underwent surgical resection, VATS procedure and wedge resection of the right lower lobe done in 2009.  The pathology showed recurrence of her original renal cell carcinoma. 3. She is status post cryoablation of a right renal mass in February 2019.  Current therapy: Active surveillance.   Interim History: Ms. Dethomas reports no major complaints.  She was started on Crestor recently due to increase in her cholesterol but no other issues noted.  She denies any recent hospitalizations or illnesses.  She denies any bone pain or pathological fractures.  Formal status and quality of life remain excellent.     Medications: I have reviewed the patient's current medications.  Current Outpatient Medications  Medication Sig Dispense Refill  . rosuvastatin (CRESTOR) 10 MG tablet Take 1 tablet (10 mg total) by mouth daily. **PATIENT NEEDS APT FOR FURTHER REFILLS** 15 tablet 0   No  current facility-administered medications for this visit.     Allergies:  Allergies  Allergen Reactions  . Oxycodone-Acetaminophen Nausea And Vomiting        Lab Results: Lab Results  Component Value Date   WBC 6.6 10/01/2019   HGB 14.1 10/01/2019   HCT 42.5 10/01/2019   MCV 98 (H) 10/01/2019   PLT 308 10/01/2019     Chemistry      Component Value Date/Time   NA 140 10/01/2019 0911   NA 140 08/08/2017 1122   K 4.7 10/01/2019 0911   K 4.5 08/08/2017 1122   CL 104 10/01/2019 0911   CL 105 11/11/2012 0956   CO2 21 10/01/2019 0911   CO2 23 08/08/2017 1122   BUN 12 10/01/2019 0911   BUN 13.8 08/08/2017 1122   CREATININE 1.06 (H) 10/01/2019 0911   CREATININE 1.04 (H) 07/28/2019 0935   CREATININE 1.0 08/08/2017 1122      Component Value Date/Time   CALCIUM 9.9 10/01/2019 0911   CALCIUM 10.0 08/08/2017 1122   ALKPHOS 77 10/01/2019 0911   ALKPHOS 74 08/08/2017 1122   AST 20 10/01/2019 0911   AST 23 07/28/2019 0935   AST 19 08/08/2017 1122   ALT 19 10/01/2019 0911   ALT 24 07/28/2019 0935   ALT 19 08/08/2017 1122   BILITOT 0.5 10/01/2019 0911   BILITOT 0.4 07/28/2019 0935   BILITOT 0.48 08/08/2017 1122       Radiological Studies: CT Chest Wo Contrast  Result Date: 10/26/2019 CLINICAL DATA:  69 year old female with history of pulmonary nodule. Kidney cancer. EXAM: CT CHEST WITHOUT CONTRAST TECHNIQUE: Multidetector CT  imaging of the chest was performed following the standard protocol without IV contrast. COMPARISON:  Chest CT 07/28/2019. FINDINGS: Cardiovascular: Heart size is normal. There is no significant pericardial fluid, thickening or pericardial calcification. There is aortic atherosclerosis, as well as atherosclerosis of the great vessels of the mediastinum and the coronary arteries, including calcified atherosclerotic plaque in the left anterior descending coronary artery. Mediastinum/Nodes: No pathologically enlarged mediastinal or hilar lymph nodes. Please  note that accurate exclusion of hilar adenopathy is limited on noncontrast CT scans. Esophagus is unremarkable in appearance. No axillary lymphadenopathy. Lungs/Pleura: Previously noted right upper lobe pulmonary nodule has completely resolved, indicative of a benign infectious or inflammatory nodule on the prior study no new suspicious appearing pulmonary nodules or masses are noted. No acute consolidative airspace disease. No pleural effusions. Postoperative changes of wedge resection are noted in the right lower lobe. Upper Abdomen: Status post cholecystectomy. Aortic atherosclerosis. Status post left nephrectomy. Architectural distortion in the upper pole the right kidney where there is some calcifications, possibly from prior partial nephrectomy. Musculoskeletal: There are no aggressive appearing lytic or blastic lesions noted in the visualized portions of the skeleton. IMPRESSION: 1. Interval resolution of previously noted right upper lobe pulmonary nodule, presumably of infectious or inflammatory etiology on the prior study. No new suspicious pulmonary nodules are noted. 2. Aortic atherosclerosis, in addition to left anterior descending coronary artery disease. Please note that although the presence of coronary artery calcium documents the presence of coronary artery disease, the severity of this disease and any potential stenosis cannot be assessed on this non-gated CT examination. Assessment for potential risk factor modification, dietary therapy or pharmacologic therapy may be warranted, if clinically indicated. Electronically Signed   By: Vinnie Langton M.D.   On: 10/26/2019 15:08   IMPRESSION: 1. New 1.5 by 1.3 cm mixed density right upper lobe pulmonary nodule with a 0.8 by 0.6 cm solid component, concerning for potential malignancy given the patient's history. Inflammatory lesion is not excluded, and surveillance imaging of the chest is recommended. 2. No findings of recurrence along the right  kidney upper pole. 3. Other imaging findings of potential clinical significance: Sigmoid colon diverticulosis. Calcified uterine fibroids. Splenectomy with regenerative splenic tissue. Several small hepatic lesions at or below 6 mm in diameter are stable and most likely benign.   Impression and Plan:  69 year old woman with  1.    Stage IV kidney cancer diagnosed initially in 2006 with localized left kidney tumor and resected pulmonary metastasis in 2009.      She remains on active surveillance with a CT scan in December 2020 showed no evidence of any active disease.  I recommended continued active surveillance at this time with repeat imaging studies in 9 months.  2.  Pulmonary nodule: Appears to be inflammatory and infectious and has resolved based on imaging studies obtained recently.  CT scan of the chest obtained on October 26, 2019 showed no other abnormalities at this time.  3.  Right kidney neoplasm:  Status post ablation without any evidence of recurrence at this time.   4.  Retroperitoneal lymph node enlargement: Appears to be reactive in nature and has resolved at this time.  5.  Follow-up: We will be in December 2021 after repeat imaging studies.   I discussed the assessment and treatment plan with the patient. The patient was provided an opportunity to ask questions and all were answered. The patient agreed with the plan and demonstrated an understanding of the instructions.   The  patient was advised to call back or seek an in-person evaluation if the symptoms worsen or if the condition fails to improve as anticipated.  I provided 20  minutes of non face-to-face telephone visit time during this encounter, and > 50% was spent on reviewing imaging studies, discussing differential diagnosis and management options for the future.  Zola Button, MD 10/28/2019 9:04 AM

## 2019-10-29 ENCOUNTER — Telehealth: Payer: Self-pay | Admitting: Oncology

## 2019-10-29 ENCOUNTER — Encounter: Payer: Self-pay | Admitting: Oncology

## 2019-10-29 NOTE — Telephone Encounter (Signed)
Scheduled appt per 3/4 los.  Spoke with pt and she is aware of her scheduled appt date and time.

## 2019-11-09 ENCOUNTER — Other Ambulatory Visit: Payer: Self-pay

## 2019-11-09 ENCOUNTER — Other Ambulatory Visit: Payer: Medicare PPO

## 2019-11-09 DIAGNOSIS — Z79899 Other long term (current) drug therapy: Secondary | ICD-10-CM

## 2019-11-09 DIAGNOSIS — E785 Hyperlipidemia, unspecified: Secondary | ICD-10-CM

## 2019-11-10 LAB — LIPID PANEL
Chol/HDL Ratio: 2.8 ratio (ref 0.0–4.4)
Cholesterol, Total: 167 mg/dL (ref 100–199)
HDL: 59 mg/dL (ref >39)
LDL Chol Calc (NIH): 90 mg/dL (ref 0–99)
Triglycerides: 97 mg/dL (ref 0–149)
VLDL Cholesterol Cal: 18 mg/dL (ref 5–40)

## 2019-11-10 LAB — HEPATIC FUNCTION PANEL
ALT: 52 IU/L — ABNORMAL HIGH (ref 0–32)
AST: 44 IU/L — ABNORMAL HIGH (ref 0–40)
Albumin: 4.6 g/dL (ref 3.8–4.8)
Alkaline Phosphatase: 72 IU/L (ref 39–117)
Bilirubin Total: 0.4 mg/dL (ref 0.0–1.2)
Bilirubin, Direct: 0.15 mg/dL (ref 0.00–0.40)
Total Protein: 7.2 g/dL (ref 6.0–8.5)

## 2019-12-02 DIAGNOSIS — R7303 Prediabetes: Secondary | ICD-10-CM | POA: Insufficient documentation

## 2019-12-21 ENCOUNTER — Telehealth: Payer: Self-pay

## 2019-12-21 NOTE — Telephone Encounter (Signed)
Pt called to inquire if she should still be continuing Crestor since her last lipid panel had improved so dramatically and her LFTs were slightly increase.  I read the pt Dr. Hershal Coria results note re: the most recent labs and advised pt to continue the medication.  However, if did advise pt that if she wished to discontinue this medication since she has made significant lifestyle and diet modifications, she should discuss this with PCP and see if her lipids are well controlled without medication.  Pt expressed understanding.  Charyl Bigger, CMA

## 2020-02-17 ENCOUNTER — Other Ambulatory Visit: Payer: Self-pay | Admitting: Physician Assistant

## 2020-02-17 MED ORDER — ROSUVASTATIN CALCIUM 10 MG PO TABS: 10.0000 mg | ORAL_TABLET | ORAL | 0 refills | Status: DC

## 2020-02-17 NOTE — Telephone Encounter (Signed)
[place] and has only 1 day left of Rosuvastatin, she is requesting a refill be sent to pharmacy in [place], [place].  ---Pt's [place] [place] w/ [name] 07/2018 CPE 2/21/cancelled)----pt sees Cancer Doctor every 2 wks.  Pt req :  rosuvastatin (CRESTOR) 10 MG tablet [[id number]]   Order Details Dose: 10 mg Route: Oral Frequency: Daily  Dispense Quantity: 15 tablet Refills: 0       Sig: Take 1 tablet (10 mg total) by mouth daily. **PATIENT NEEDS APT FOR FURTHER REFILLS**       Patient as that we send refill authorization to  :   [facility]- [place], [place], [place] Phone:  [phone]  Fax:  [phone]     - Forwarding request to med asst if questions contact pt @ [phone].  --glh

## 2020-02-17 NOTE — Telephone Encounter (Signed)
Esaw Grandchild, NP  10/04/2019 11:33 AM EST     Good Morning Tonya, Can you please call Ms. Badalamenti and share- TSH-WNL, 1.320 A1c-5.8- pre-diabetic- please encourage her to reduce CHO/sugar- increase regular exercise. Recommend re-checking in 3 months. CBC-stable CMP-stable, GFR 55- hx of left nephrectomy in 2006 Lipid Panel Total- 275, goal <199 LDL-182, goal <99 The 10-year ASCVD risk score Mikey Bussing DC Jr., et al., 2013) is: 7.3% Recommend Rosuvastatin 10mg  twice weekly Will need to check lipids/hepatic fx panel if she is agreeable in 6 weeks. Thanks! [name]

## 2020-03-13 ENCOUNTER — Encounter: Payer: Self-pay | Admitting: Physician Assistant

## 2020-03-13 ENCOUNTER — Other Ambulatory Visit: Payer: Self-pay

## 2020-03-13 ENCOUNTER — Telehealth (INDEPENDENT_AMBULATORY_CARE_PROVIDER_SITE_OTHER): Payer: Medicare PPO | Admitting: Physician Assistant

## 2020-03-13 VITALS — Ht 65.0 in | Wt 140.0 lb

## 2020-03-13 DIAGNOSIS — E7800 Pure hypercholesterolemia, unspecified: Secondary | ICD-10-CM | POA: Diagnosis not present

## 2020-03-13 DIAGNOSIS — N3941 Urge incontinence: Secondary | ICD-10-CM | POA: Insufficient documentation

## 2020-03-13 DIAGNOSIS — N920 Excessive and frequent menstruation with regular cycle: Secondary | ICD-10-CM | POA: Insufficient documentation

## 2020-03-13 DIAGNOSIS — R748 Abnormal levels of other serum enzymes: Secondary | ICD-10-CM | POA: Diagnosis not present

## 2020-03-13 DIAGNOSIS — Z79899 Other long term (current) drug therapy: Secondary | ICD-10-CM

## 2020-03-13 DIAGNOSIS — M545 Low back pain, unspecified: Secondary | ICD-10-CM | POA: Insufficient documentation

## 2020-03-13 DIAGNOSIS — C7800 Secondary malignant neoplasm of unspecified lung: Secondary | ICD-10-CM | POA: Insufficient documentation

## 2020-03-13 DIAGNOSIS — M542 Cervicalgia: Secondary | ICD-10-CM

## 2020-03-13 DIAGNOSIS — N393 Stress incontinence (female) (male): Secondary | ICD-10-CM | POA: Insufficient documentation

## 2020-03-13 DIAGNOSIS — K649 Unspecified hemorrhoids: Secondary | ICD-10-CM | POA: Insufficient documentation

## 2020-03-13 DIAGNOSIS — N951 Menopausal and female climacteric states: Secondary | ICD-10-CM | POA: Insufficient documentation

## 2020-03-13 DIAGNOSIS — N941 Unspecified dyspareunia: Secondary | ICD-10-CM | POA: Insufficient documentation

## 2020-03-13 MED ORDER — ROSUVASTATIN CALCIUM 10 MG PO TABS: 10.0000 mg | ORAL_TABLET | ORAL | 1 refills | Status: DC

## 2020-03-13 NOTE — Progress Notes (Signed)
Telehealth office visit note for Jenny Reid, PA-C- at Primary Care at [facility]   I connected with current patient today by telephone and verified that I am speaking with the correct person   . Location of the patient: Home . Location of the provider: Office - This visit type was conducted due to national recommendations for restrictions regarding the COVID-19 Pandemic (e.g. social distancing) in an effort to limit this patient's exposure and mitigate transmission in our community.    - No physical exam could be performed with this format, beyond that communicated to [place] by the patient/ family members as noted.   - Additionally my office staff/ schedulers were to discuss with the patient that there may be a monetary charge related to this service, depending on their medical insurance.  My understanding is that patient understood and consented to proceed.     _________________________________________________________________________________   History of Present Illness: Pt calls in for follow-up on hypercholesteroemia and medication refills.  Reports she is currently staying at their place in [place] and will be back in [place] area in October. Patient recently saw Dr. Baltazar Najjar with [facility] due to worsening neck pain, and had a MRI done March 08 2020 which revealed DDD and a possible mass. She is waiting for insurance approval for a CT scan for further evaluation.  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 She will have copies of her imaging studies faxed to our office.  HLD: Pt taking medication as directed without issues. Denies side effects including myalgias and RUQ pain.  She tries to stay as active as possible, she swims twice a week.  Elevated liver enzymes: Reports she has been careful to avoid Tylenol given her liver enzymes  were mildly elevated 10/2019. Denies prior history of elevated liver enzymes and wonders if it is a possible side effect from Crestor.    No flowsheet data found.  Depression screen [facility] 2/9 03/13/2020 08/06/2018 10/02/2016  Decreased Interest 0 0 0  Down, Depressed, Hopeless 0 0 0  PHQ - 2 Score 0 0 0  Altered sleeping 0 0 -  Tired, decreased energy 0 0 -  Change in appetite 0 0 -  Feeling bad or failure about yourself  0 0 -  Trouble concentrating 0 0 -  Moving slowly or fidgety/restless 0 0 -  Suicidal thoughts 0 0 -  PHQ-9 Score 0 0 -      Impression and Recommendations:     1. Hypercholesterolemia   2. On statin therapy   3. Elevated liver enzymes   4. Neck pain     Hypercholesteremia, on statin therapy: -Last lipid panel within normal limits. -Continue Crestor 10 mg. -Follow heart healthy diet and continue to stay as active as possible. -Patient reports that there is a [facility] in [place] so will place lab orders and fax them to [place] location.  Elevated liver enzymes: -AST 44, ALT 52 -Recommend to continue to avoid hepatotoxic substances such as excessive Tylenol. -Will continue to monitor.  Neck pain: -Followed by orthopedics. Continue with therapy sessions. -Recommend to use topical anti-inflammatory such as Voltaren gel as needed. -Okay to alternate Tylenol and ibuprofen for severe pain relief only when needed.  Recommend against daily use due to elevated liver enzymes and impaired kidney function (last CMP-creatinine 1.04, GFR 55).    -  As part of my medical decision making, I reviewed the following data within the [place] History obtained from pt /family, CMA notes reviewed and incorporated if applicable, Labs reviewed, Radiograph/ tests reviewed if applicable and OV notes from prior OV's with me, as well as any other specialists she/he has seen since seeing me last, were all reviewed and used in my medical decision making process  today.    - Additionally, when appropriate, discussion had with patient regarding our treatment plan, and their biases/concerns about that plan were used in my medical decision making today.    - The patient agreed with the plan and demonstrated an understanding of the instructions.   No barriers to understanding were identified.     - The patient was advised to call back or seek an in-person evaluation if the symptoms worsen or if the condition fails to improve as anticipated.   Return in about 6 months (around 09/13/2020) for HLD.    Orders Placed This Encounter  Procedures  . Lipid panel  . CBC  . Comprehensive metabolic panel    Meds ordered this encounter  Medications  . rosuvastatin (CRESTOR) 10 MG tablet    Sig: Take 1 tablet (10 mg total) by mouth 2 (two) times a week.    Dispense:  24 tablet    Refill:  1    Medications Discontinued During This Encounter  Medication Reason  . rosuvastatin (CRESTOR) 10 MG tablet Reorder       Time spent on visit including pre-visit chart review and post-visit care was 23 minutes.      The [place] was signed into law in 2016 which includes the topic of electronic health records.  This provides immediate access to information in MyChart.  This includes consultation notes, operative notes, office notes, lab results and pathology reports.  If you have any questions about what you read please let us know at your next visit or call us at the office.  We are right here with you.   __________________________________________________________________________________     Patient Care Team    Relationship Specialty Notifications Start End  Jenny Soto, [place] PCP - General Physician Assistant  03/13/20   Wyatt Portela, MD Consulting Physician Oncology  10/02/16   Janyth Contes, MD Consulting Physician Obstetrics and Gynecology  10/02/16   Devra Dopp, MD Referring Physician Dermatology  10/02/16    Rutherford Guys, MD Consulting Physician Ophthalmology  10/02/16   Wyatt Portela, MD Consulting Physician Oncology  10/02/16      -Vitals obtained; medications/ allergies reconciled;  personal medical, social, Sx etc.histories were updated by CMA, reviewed by me and are reflected in chart   Patient Active Problem List   Diagnosis Date Noted  . Dyspareunia in female 03/13/2020  . Female stress incontinence 03/13/2020  . Hemorrhoids 03/13/2020  . Hypercholesterolemia 03/13/2020  . Low back pain 03/13/2020  . Menopausal symptom 03/13/2020  . Menorrhagia 03/13/2020  . 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 Urge incontinence of urine 03/13/2020  . Prediabetes 12/02/2019  . Healthcare maintenance 08/06/2018  . Renal cell carcinoma of right kidney ([place]) 09/26/2017  . Elevated lipids 10/02/2016  . Fatigue 10/02/2016  . Primary malignant neoplasm of kidney ([place]) 05/27/2011  . Disorder of gallbladder 05/27/2011     Current Meds  Medication Sig  . rosuvastatin (CRESTOR) 10 MG tablet Take 1 tablet (10 mg total) by mouth 2 (two) times a week.  . [DISCONTINUED]  rosuvastatin (CRESTOR) 10 MG tablet Take 1 tablet (10 mg total) by mouth 2 (two) times a week. **PATIENT NEEDS APT FOR FURTHER REFILLS**     Allergies:  Allergies  Allergen Reactions  . Oxycodone-Acetaminophen Nausea And Vomiting     ROS:  See above HPI for pertinent positives and negatives   Objective:   Height 5\' 5"  (1.651 m), weight 140 lb (63.5 kg).  (if some vitals are omitted, this means that patient was UNABLE to obtain them even though they were asked to get them prior to OV today.  They were asked to call us at their earliest convenience with these once obtained. ) General: A & O * 3; sounds in no acute distress; in usual state of health.  Respiratory: speaking in full sentences, no conversational dyspnea; Psych: insight appears good, mood- appears full

## 2020-03-14 NOTE — Addendum Note (Signed)
Addended by: Mickel Crow on: 03/14/2020 01:32 PM   Modules accepted: Orders

## 2020-03-16 LAB — LIPID PANEL
Chol/HDL Ratio: 2.8 ratio (ref 0.0–4.4)
Cholesterol, Total: 198 mg/dL (ref 100–199)
HDL: 72 mg/dL (ref >39)
LDL Chol Calc (NIH): 108 mg/dL — ABNORMAL HIGH (ref 0–99)
Triglycerides: 105 mg/dL (ref 0–149)
VLDL Cholesterol Cal: 18 mg/dL (ref 5–40)

## 2020-03-16 LAB — COMPREHENSIVE METABOLIC PANEL
ALT: 26 IU/L (ref 0–32)
AST: 25 IU/L (ref 0–40)
Albumin/Globulin Ratio: 1.7 (ref 1.2–2.2)
Albumin: 4.5 g/dL (ref 3.8–4.8)
Alkaline Phosphatase: 79 IU/L (ref 48–121)
BUN/Creatinine Ratio: 19 (ref 12–28)
BUN: 18 mg/dL (ref 8–27)
Bilirubin Total: 0.4 mg/dL (ref 0.0–1.2)
CO2: 24 mmol/L (ref 20–29)
Calcium: 10.5 mg/dL — ABNORMAL HIGH (ref 8.7–10.3)
Chloride: 102 mmol/L (ref 96–106)
Creatinine, Ser: 0.94 mg/dL (ref 0.57–1.00)
GFR calc Af Amer: 73 mL/min/{1.73_m2} (ref >59)
GFR calc non Af Amer: 63 mL/min/{1.73_m2} (ref >59)
Globulin, Total: 2.7 g/dL (ref 1.5–4.5)
Glucose: 94 mg/dL (ref 65–99)
Potassium: 4.7 mmol/L (ref 3.5–5.2)
Sodium: 139 mmol/L (ref 134–144)
Total Protein: 7.2 g/dL (ref 6.0–8.5)

## 2020-03-16 LAB — CBC
Hematocrit: 40.1 % (ref 34.0–46.6)
Hemoglobin: 13 g/dL (ref 11.1–15.9)
MCH: 31.6 pg (ref 26.6–33.0)
MCHC: 32.4 g/dL (ref 31.5–35.7)
MCV: 97 fL (ref 79–97)
Platelets: 307 10*3/uL (ref 150–450)
RBC: 4.12 x10E6/uL (ref 3.77–5.28)
RDW: 13.9 % (ref 11.7–15.4)
WBC: 7.3 10*3/uL (ref 3.4–10.8)

## 2020-03-20 ENCOUNTER — Encounter: Payer: Self-pay | Admitting: Oncology

## 2020-03-21 ENCOUNTER — Telehealth: Payer: Self-pay | Admitting: Physician Assistant

## 2020-03-21 DIAGNOSIS — R899 Unspecified abnormal finding in specimens from other organs, systems and tissues: Secondary | ICD-10-CM

## 2020-03-21 NOTE — Telephone Encounter (Signed)
-----   Message from Lorrene Reid, [place] sent at 03/17/2020  4:03 PM EDT ----- Please place CMP order to recheck calcium. Pt went to [facility] in [facility].  Thank you, Herb Grays

## 2020-04-06 ENCOUNTER — Encounter: Payer: Self-pay | Admitting: Physician Assistant

## 2020-04-06 ENCOUNTER — Telehealth: Payer: Self-pay

## 2020-04-06 NOTE — Telephone Encounter (Signed)
PT ADVICE REQUEST:  Patient stated:  CT scan was cancelled in favor of another MRI this time with contrast, scheduled for 18th to check for any possible mass in my neck. Still have pain. PT wanted to wait for these results before starting that for the arthritis I have. This is being done in [facility]. Will fax results to Primary Care and you if necessary. Jenny Soto   Dr Alen Blew made aware of pt reply.

## 2020-04-11 ENCOUNTER — Encounter: Payer: Self-pay | Admitting: Oncology

## 2020-04-13 LAB — COMPREHENSIVE METABOLIC PANEL
ALT: 27 IU/L (ref 0–32)
AST: 24 IU/L (ref 0–40)
Albumin/Globulin Ratio: 1.6 (ref 1.2–2.2)
Albumin: 4.6 g/dL (ref 3.8–4.8)
Alkaline Phosphatase: 70 IU/L (ref 48–121)
BUN/Creatinine Ratio: 19 (ref 12–28)
BUN: 16 mg/dL (ref 8–27)
Bilirubin Total: 0.7 mg/dL (ref 0.0–1.2)
CO2: 24 mmol/L (ref 20–29)
Calcium: 10.5 mg/dL — ABNORMAL HIGH (ref 8.7–10.3)
Chloride: 103 mmol/L (ref 96–106)
Creatinine, Ser: 0.83 mg/dL (ref 0.57–1.00)
GFR calc Af Amer: 84 mL/min/{1.73_m2} (ref >59)
GFR calc non Af Amer: 73 mL/min/{1.73_m2} (ref >59)
Globulin, Total: 2.9 g/dL (ref 1.5–4.5)
Glucose: 109 mg/dL — ABNORMAL HIGH (ref 65–99)
Potassium: 4.7 mmol/L (ref 3.5–5.2)
Sodium: 139 mmol/L (ref 134–144)
Total Protein: 7.5 g/dL (ref 6.0–8.5)

## 2020-04-15 ENCOUNTER — Encounter: Payer: Self-pay | Admitting: Physician Assistant

## 2020-04-17 NOTE — Telephone Encounter (Signed)
Patient is aware of lab results and verbalized understanding. AS, CMA

## 2020-04-26 DIAGNOSIS — M542 Cervicalgia: Secondary | ICD-10-CM | POA: Diagnosis not present

## 2020-05-03 DIAGNOSIS — M542 Cervicalgia: Secondary | ICD-10-CM | POA: Diagnosis not present

## 2020-05-08 DIAGNOSIS — M542 Cervicalgia: Secondary | ICD-10-CM | POA: Diagnosis not present

## 2020-05-11 DIAGNOSIS — M542 Cervicalgia: Secondary | ICD-10-CM | POA: Diagnosis not present

## 2020-05-15 DIAGNOSIS — M542 Cervicalgia: Secondary | ICD-10-CM | POA: Diagnosis not present

## 2020-05-17 DIAGNOSIS — M542 Cervicalgia: Secondary | ICD-10-CM | POA: Diagnosis not present

## 2020-05-24 DIAGNOSIS — M542 Cervicalgia: Secondary | ICD-10-CM | POA: Diagnosis not present

## 2020-06-27 ENCOUNTER — Encounter: Payer: Self-pay | Admitting: Oncology

## 2020-06-27 ENCOUNTER — Encounter (INDEPENDENT_AMBULATORY_CARE_PROVIDER_SITE_OTHER): Payer: Self-pay

## 2020-07-21 ENCOUNTER — Encounter: Payer: Self-pay | Admitting: Physician Assistant

## 2020-07-28 ENCOUNTER — Ambulatory Visit (HOSPITAL_COMMUNITY)
Admission: RE | Admit: 2020-07-28 | Discharge: 2020-07-28 | Disposition: A | Discharge status: Home / Self Care | Payer: Medicare PPO | Source: Ambulatory Visit | Attending: Oncology | Admitting: Oncology

## 2020-07-28 ENCOUNTER — Other Ambulatory Visit: Payer: Medicare PPO

## 2020-07-28 ENCOUNTER — Other Ambulatory Visit: Payer: Self-pay

## 2020-07-28 ENCOUNTER — Encounter (HOSPITAL_COMMUNITY): Payer: Self-pay

## 2020-07-28 ENCOUNTER — Inpatient Hospital Stay: Payer: Medicare PPO | Attending: Oncology

## 2020-07-28 ENCOUNTER — Encounter: Payer: Self-pay | Admitting: Oncology

## 2020-07-28 DIAGNOSIS — K5790 Diverticulosis of intestine, part unspecified, without perforation or abscess without bleeding: Secondary | ICD-10-CM | POA: Insufficient documentation

## 2020-07-28 DIAGNOSIS — C649 Malignant neoplasm of unspecified kidney, except renal pelvis: Secondary | ICD-10-CM | POA: Insufficient documentation

## 2020-07-28 DIAGNOSIS — Z85528 Personal history of other malignant neoplasm of kidney: Secondary | ICD-10-CM | POA: Diagnosis not present

## 2020-07-28 DIAGNOSIS — I700 Atherosclerosis of aorta: Secondary | ICD-10-CM | POA: Insufficient documentation

## 2020-07-28 DIAGNOSIS — R911 Solitary pulmonary nodule: Secondary | ICD-10-CM

## 2020-07-28 DIAGNOSIS — K5730 Diverticulosis of large intestine without perforation or abscess without bleeding: Secondary | ICD-10-CM | POA: Diagnosis not present

## 2020-07-28 DIAGNOSIS — Z905 Acquired absence of kidney: Secondary | ICD-10-CM | POA: Insufficient documentation

## 2020-07-28 DIAGNOSIS — D7389 Other diseases of spleen: Secondary | ICD-10-CM | POA: Diagnosis not present

## 2020-07-28 LAB — CMP (CANCER CENTER ONLY)
ALT: 26 U/L (ref 0–44)
AST: 24 U/L (ref 15–41)
Albumin: 4.1 g/dL (ref 3.5–5.0)
Alkaline Phosphatase: 63 U/L (ref 38–126)
Anion gap: 9 (ref 5–15)
BUN: 13 mg/dL (ref 8–23)
CO2: 26 mmol/L (ref 22–32)
Calcium: 10.7 mg/dL — ABNORMAL HIGH (ref 8.9–10.3)
Chloride: 103 mmol/L (ref 98–111)
Creatinine: 1 mg/dL (ref 0.44–1.00)
GFR, Estimated: >60 mL/min (ref >60)
Glucose, Bld: 115 mg/dL — ABNORMAL HIGH (ref 70–99)
Potassium: 4.6 mmol/L (ref 3.5–5.1)
Sodium: 138 mmol/L (ref 135–145)
Total Bilirubin: 0.4 mg/dL (ref 0.3–1.2)
Total Protein: 7.9 g/dL (ref 6.5–8.1)

## 2020-07-28 LAB — CBC WITH DIFFERENTIAL (CANCER CENTER ONLY)
Abs Immature Granulocytes: 0.02 10*3/uL (ref 0.00–0.07)
Basophils Absolute: 0.1 10*3/uL (ref 0.0–0.1)
Basophils Relative: 1 %
Eosinophils Absolute: 0.2 10*3/uL (ref 0.0–0.5)
Eosinophils Relative: 3 %
HCT: 41.3 % (ref 36.0–46.0)
Hemoglobin: 13.4 g/dL (ref 12.0–15.0)
Immature Granulocytes: 0 %
Lymphocytes Relative: 29 %
Lymphs Abs: 2.5 10*3/uL (ref 0.7–4.0)
MCH: 32.3 pg (ref 26.0–34.0)
MCHC: 32.4 g/dL (ref 30.0–36.0)
MCV: 99.5 fL (ref 80.0–100.0)
Monocytes Absolute: 0.7 10*3/uL (ref 0.1–1.0)
Monocytes Relative: 8 %
Neutro Abs: 5.1 10*3/uL (ref 1.7–7.7)
Neutrophils Relative %: 59 %
Platelet Count: 287 10*3/uL (ref 150–400)
RBC: 4.15 MIL/uL (ref 3.87–5.11)
RDW: 13.4 % (ref 11.5–15.5)
WBC Count: 8.5 10*3/uL (ref 4.0–10.5)
nRBC: 0 % (ref 0.0–0.2)

## 2020-07-28 MED ORDER — IOHEXOL 300 MG/ML  SOLN
100.0000 mL | Freq: Once | INTRAMUSCULAR | Status: AC | PRN
  Administered 2020-07-28: 80 mL via INTRAVENOUS

## 2020-08-01 ENCOUNTER — Inpatient Hospital Stay (HOSPITAL_BASED_OUTPATIENT_CLINIC_OR_DEPARTMENT_OTHER): Payer: Medicare PPO | Admitting: Oncology

## 2020-08-01 DIAGNOSIS — R911 Solitary pulmonary nodule: Secondary | ICD-10-CM | POA: Diagnosis not present

## 2020-08-01 DIAGNOSIS — C649 Malignant neoplasm of unspecified kidney, except renal pelvis: Secondary | ICD-10-CM

## 2020-08-01 NOTE — Progress Notes (Signed)
Hematology and Oncology Follow Up for Telemedicine Visits  Jenny Soto [id number] October 20, 1950 69 y.o. 08/01/2020 10:06 AM Jenny Reid, PA-[place], Jenny Spare, NP   I connected with [name] on 08/01/20 at 10:30 AM EST by telephone visit and verified that I am speaking with the correct person using two identifiers.   I discussed the limitations, risks, security and privacy concerns of performing an evaluation and management service by telemedicine and the availability of in-person appointments. I also discussed with the patient that there may be a patient responsible charge related to this service. The patient expressed understanding and agreed to proceed.  Other persons participating in the visit and their role in the encounter:  None  Patient's location: Home Provider's location: Office    Principle Diagnosis: 69 year old woman with left kidney cancer diagnosed in 2006.  She developed stage IV isolated relapse and 2009 and remains without any disease recurrence.  Secondary diagnosis: Right renal neoplasm diagnosed in 2019.  Prior Therapy:  1. Status post left nephrectomy in 2006.   2. The patient underwent surgical resection, VATS procedure and wedge resection of the right lower lobe done in 2009.  The pathology showed recurrence of her original renal cell carcinoma. 3. She is status post cryoablation of a right renal mass in February 2019.  Current therapy: Active surveillance.   Interim History: [name] reports no recent complaints.  She denies any recent hospitalization or illnesses.  She denies any back pain or shoulder pain.  She denies any hematuria or dysuria.  Her performance status and quality of life remain excellent.     Medications: Unchanged on review. Current Outpatient Medications  Medication Sig Dispense Refill  . rosuvastatin (CRESTOR) 10 MG tablet Take 1 tablet (10 mg total) by mouth 2 (two) times a week. 24 tablet 1   No current  facility-administered medications for this visit.     Allergies:  Allergies  Allergen Reactions  . Oxycodone-Acetaminophen Nausea And Vomiting        Lab Results: Lab Results  Component Value Date   WBC 8.5 07/28/2020   HGB 13.4 07/28/2020   HCT 41.3 07/28/2020   MCV 99.5 07/28/2020   PLT 287 07/28/2020     Chemistry      Component Value Date/Time   NA 138 07/28/2020 1105   NA 139 04/12/2020 0930   NA 140 08/08/2017 1122   K 4.6 07/28/2020 1105   K 4.5 08/08/2017 1122   CL 103 07/28/2020 1105   CL 105 11/11/2012 0956   CO2 26 07/28/2020 1105   CO2 23 08/08/2017 1122   BUN 13 07/28/2020 1105   BUN 16 04/12/2020 0930   BUN 13.8 08/08/2017 1122   CREATININE 1.00 07/28/2020 1105   CREATININE 1.0 08/08/2017 1122      Component Value Date/Time   CALCIUM 10.7 (H) 07/28/2020 1105   CALCIUM 10.0 08/08/2017 1122   ALKPHOS 63 07/28/2020 1105   ALKPHOS 74 08/08/2017 1122   AST 24 07/28/2020 1105   AST 19 08/08/2017 1122   ALT 26 07/28/2020 1105   ALT 19 08/08/2017 1122   BILITOT 0.4 07/28/2020 1105   BILITOT 0.48 08/08/2017 1122       IMPRESSION: 1. Post LEFT nephrectomy and RIGHT upper pole cryo ablation without signs of disease recurrence. 2. Signs of wedge resection in the RIGHT lower lobe with stable appearance of scarring, no signs of metastatic disease to the chest, abdomen or pelvis. 3. Aortic atherosclerosis.  Aortic Atherosclerosis (ICD10-I70.0).  Impression and Plan:  69 year old woman with  1.    Left kidney clear-cell cancer diagnosed in 2006.  She subsequently developed stage IV disease with pulmonary involvement 2009.  He underwent surgical resection and currently in remission.  CT scan obtained on July 28 2020 personally reviewed and showed no evidence of disease relapse.  The natural course of this disease was updated and treatment options including systemic therapy or local therapy is not needed at this time.  We still favor local  therapy if she develops isolated metastasis.  Laboratory testing is otherwise unremarkable.  I recommended continued active surveillance with repeat imaging studies in the next 6 to 8 months.  Patient prefer to have it done in 1 November which will be close to a year based on her wishes.  2.  Right kidney neoplasm:  Evidence of relapse after ablation.   3.  Follow-up: In 11 months for repeat follow-up and imaging studies.   I discussed the assessment and treatment plan with the patient. The patient was provided an opportunity to ask questions and all were answered. The patient agreed with the plan and demonstrated an understanding of the instructions.   The patient was advised to call back or seek an in-person evaluation if the symptoms worsen or if the condition fails to improve as anticipated.   I provided 20  minutes of non face-to-face telephone visit time during this encounter, the time was dedicated to updating her disease status, reviewing imaging studies and outlining future plan of care.   Zola Button, MD 08/01/2020 10:06 AM

## 2020-08-04 ENCOUNTER — Encounter: Payer: Self-pay | Admitting: Oncology

## 2020-08-31 ENCOUNTER — Encounter: Payer: Self-pay | Admitting: Physician Assistant

## 2020-09-18 ENCOUNTER — Other Ambulatory Visit: Payer: Self-pay | Admitting: Physician Assistant

## 2020-09-18 ENCOUNTER — Telehealth: Payer: Self-pay | Admitting: Physician Assistant

## 2020-09-18 DIAGNOSIS — Z79899 Other long term (current) drug therapy: Secondary | ICD-10-CM

## 2020-09-18 DIAGNOSIS — E7800 Pure hypercholesterolemia, unspecified: Secondary | ICD-10-CM

## 2020-09-18 DIAGNOSIS — Z0000 Encounter for general adult medical examination without abnormal findings: Secondary | ICD-10-CM

## 2020-09-18 NOTE — Telephone Encounter (Signed)
Pa 

## 2020-09-18 NOTE — Telephone Encounter (Signed)
Left vm on 09-18-20

## 2020-09-18 NOTE — Telephone Encounter (Signed)
Please contact pt to schedule apt for med refills per last AVS. AS, CMA

## 2020-10-02 ENCOUNTER — Other Ambulatory Visit: Payer: Self-pay | Admitting: Physician Assistant

## 2020-10-02 DIAGNOSIS — E785 Hyperlipidemia, unspecified: Secondary | ICD-10-CM

## 2020-10-02 DIAGNOSIS — E7800 Pure hypercholesterolemia, unspecified: Secondary | ICD-10-CM

## 2020-10-02 DIAGNOSIS — Z0000 Encounter for general adult medical examination without abnormal findings: Secondary | ICD-10-CM

## 2020-10-06 ENCOUNTER — Other Ambulatory Visit: Payer: Medicare PPO

## 2020-10-06 ENCOUNTER — Other Ambulatory Visit: Payer: Self-pay

## 2020-10-06 DIAGNOSIS — E7800 Pure hypercholesterolemia, unspecified: Secondary | ICD-10-CM

## 2020-10-06 DIAGNOSIS — E785 Hyperlipidemia, unspecified: Secondary | ICD-10-CM

## 2020-10-06 DIAGNOSIS — Z0000 Encounter for general adult medical examination without abnormal findings: Secondary | ICD-10-CM

## 2020-10-07 LAB — COMPREHENSIVE METABOLIC PANEL
ALT: 26 IU/L (ref 0–32)
AST: 19 IU/L (ref 0–40)
Albumin/Globulin Ratio: 1.6 (ref 1.2–2.2)
Albumin: 4.5 g/dL (ref 3.8–4.8)
Alkaline Phosphatase: 65 IU/L (ref 44–121)
BUN/Creatinine Ratio: 10 — ABNORMAL LOW (ref 12–28)
BUN: 10 mg/dL (ref 8–27)
Bilirubin Total: 0.4 mg/dL (ref 0.0–1.2)
CO2: 22 mmol/L (ref 20–29)
Calcium: 10.5 mg/dL — ABNORMAL HIGH (ref 8.7–10.3)
Chloride: 104 mmol/L (ref 96–106)
Creatinine, Ser: 0.96 mg/dL (ref 0.57–1.00)
GFR calc Af Amer: 71 mL/min/{1.73_m2} (ref >59)
GFR calc non Af Amer: 61 mL/min/{1.73_m2} (ref >59)
Globulin, Total: 2.9 g/dL (ref 1.5–4.5)
Glucose: 107 mg/dL — ABNORMAL HIGH (ref 65–99)
Potassium: 4.6 mmol/L (ref 3.5–5.2)
Sodium: 141 mmol/L (ref 134–144)
Total Protein: 7.4 g/dL (ref 6.0–8.5)

## 2020-10-07 LAB — LIPID PANEL
Chol/HDL Ratio: 3 ratio (ref 0.0–4.4)
Cholesterol, Total: 196 mg/dL (ref 100–199)
HDL: 66 mg/dL (ref >39)
LDL Chol Calc (NIH): 109 mg/dL — ABNORMAL HIGH (ref 0–99)
Triglycerides: 117 mg/dL (ref 0–149)
VLDL Cholesterol Cal: 21 mg/dL (ref 5–40)

## 2020-10-07 LAB — HEMOGLOBIN A1C
Est. average glucose Bld gHb Est-mCnc: 117 mg/dL
Hgb A1c MFr Bld: 5.7 % — ABNORMAL HIGH (ref 4.8–5.6)

## 2020-10-07 LAB — CBC
Hematocrit: 43.3 % (ref 34.0–46.6)
Hemoglobin: 14.1 g/dL (ref 11.1–15.9)
MCH: 31.5 pg (ref 26.6–33.0)
MCHC: 32.6 g/dL (ref 31.5–35.7)
MCV: 97 fL (ref 79–97)
Platelets: 293 10*3/uL (ref 150–450)
RBC: 4.47 x10E6/uL (ref 3.77–5.28)
RDW: 13 % (ref 11.7–15.4)
WBC: 5.5 10*3/uL (ref 3.4–10.8)

## 2020-10-07 LAB — TSH: TSH: 1.04 u[IU]/mL (ref 0.450–4.500)

## 2020-10-09 ENCOUNTER — Encounter: Payer: Self-pay | Admitting: Physician Assistant

## 2020-10-09 ENCOUNTER — Other Ambulatory Visit: Payer: Self-pay

## 2020-10-09 ENCOUNTER — Ambulatory Visit: Payer: Medicare PPO | Admitting: Physician Assistant

## 2020-10-09 ENCOUNTER — Ambulatory Visit (INDEPENDENT_AMBULATORY_CARE_PROVIDER_SITE_OTHER): Payer: Medicare PPO | Admitting: Physician Assistant

## 2020-10-09 VITALS — BP 131/70 | HR 73 | Temp 97.0°F | Ht 65.0 in | Wt 146.1 lb

## 2020-10-09 DIAGNOSIS — Z1231 Encounter for screening mammogram for malignant neoplasm of breast: Secondary | ICD-10-CM | POA: Diagnosis not present

## 2020-10-09 DIAGNOSIS — E2839 Other primary ovarian failure: Secondary | ICD-10-CM | POA: Diagnosis not present

## 2020-10-09 DIAGNOSIS — Z0000 Encounter for general adult medical examination without abnormal findings: Secondary | ICD-10-CM | POA: Diagnosis not present

## 2020-10-09 DIAGNOSIS — Z780 Asymptomatic menopausal state: Secondary | ICD-10-CM

## 2020-10-09 NOTE — Addendum Note (Signed)
Addended by: Lorrene Reid on: 10/09/2020 12:00 PM   Modules accepted: Level of Service

## 2020-10-09 NOTE — Progress Notes (Addendum)
Subjective:   Jenny Soto is a 70 y.o. female who presents for Medicare Annual (Subsequent) preventive examination.  Review of Systems    General:   No F/C, wt loss Pulm:   No DIB, SOB, pleuritic chest pain Card:  No CP, palpitations Abd:  No n/v/d or pain Ext:  No inc edema from baseline    Objective:    Today's Vitals   10/09/20 1102  BP: 131/70  Pulse: 73  Temp: (!) 97 F (36.1 C)  SpO2: 99%  Weight: 146 lb 1.6 oz (66.3 kg)  Height: 5\' 5"  (1.651 m)   Body mass index is 24.31 kg/m.  Advanced Directives 02/12/2018 09/26/2017 09/26/2017 09/23/2017 12/02/2016 11/18/2016 10/02/2016  Does Patient Have a Medical Advance Directive? No Yes Yes Yes Yes Yes Yes  Type of Advance Directive - Living will Living will Living will [facility];Living will [place];Living will [place];Living will;Out of facility DNR (pink MOST or yellow form)  Does patient want to make changes to medical advance directive? - No - Patient declined - - - - -  Copy of [place] in Chart? - No - copy requested No - copy requested No - copy requested No - copy requested - -  Would patient like information on creating a medical advance directive? No - Patient declined - - - - - -    Current Medications (verified) Outpatient Encounter Medications as of 10/09/2020  Medication Sig  . rosuvastatin (CRESTOR) 10 MG tablet Take 1 tablet (10 mg total) by mouth 2 (two) times a week. **NEEDS APT FOR REFILLS**   No facility-administered encounter medications on file as of 10/09/2020.    Allergies (verified) Oxycodone-acetaminophen   History: Past Medical History:  Diagnosis Date  . 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 Gallbladder sludge   . History of hiatal hernia   . Hyperlipidemia   . PONV (postoperative nausea and vomiting)   . Right renal mass    Past Surgical History:  Procedure Laterality Date  . BLADDER SUSPENSION  july 2011  and nov 2011  . COLONOSCOPY    . [place]  . HERNIA REPAIR N/A    Phreesia 10/06/2020  . INCISIONAL HERNIA REPAIR N/A 10/04/2015   Procedure: LAPAROSCOPIC INCISIONAL HERNIA;  Surgeon: Ralene Ok, MD;  Location: [facility];  Service: General;  Laterality: N/A;  . INSERTION OF MESH N/A 10/04/2015   Procedure: WITH INSERTION OF MESH;  Surgeon: Ralene Ok, MD;  Location: [facility];  Service: General;  Laterality: N/A;  . IR RADIOLOGIST EVAL & MGMT  09/10/2017  . IR RADIOLOGIST EVAL & MGMT  10/28/2017  . IR RADIOLOGIST EVAL & MGMT  01/27/2018  . LAPAROSCOPIC CHOLECYSTECTOMY  05/29/11  . LOBECTOMY  2009   right lower lung  . NEPHRECTOMY  october 2006   left kidney  . [place]  2005  . POLYPECTOMY  1985   vocal cord  . RADIOFREQUENCY ABLATION Right 09/26/2017   Procedure: CT RENAL CRYOABLATION;  Surgeon: Markus Daft, MD;  Location: [facility];  Service: Anesthesiology;  Laterality: Right;  . RHINOPLASTY  1985  . SPLENECTOMY, TOTAL  october 2006  . TUBAL LIGATION  1983  . TUMOR REMOVAL  1992   ovarian durmoid tumors bilateral   Family History  Adopted: Yes  Problem Relation Age of Onset  . Heart disease Mother   . Colon cancer Neg Hx    Social  History   Socioeconomic History  . Marital status: Married    Spouse name: Not on file  . Number of children: Not on file  . Years of education: Not on file  . Highest education level: Not on file  Occupational History  . Not on file  Tobacco Use  . Smoking status: Former Smoker    Packs/day: 0.50    Years: 20.00    Pack years: 10.00    Types: Cigarettes    Quit date: 08/26/2004    Years since quitting: 16.1  . Smokeless tobacco: Never Used  Vaping Use  . Vaping Use: Never used  Substance and Sexual Activity  . Alcohol use: Yes    Alcohol/week: 6.0 standard drinks    Types: 3 Glasses of wine, 3 Cans of beer per week    Comment: wine or beer every night  . Drug use: No  . Sexual activity: Yes    Birth  control/protection: Surgical  Other Topics Concern  . Not on file  Social History Narrative  . Not on file   Social Determinants of Health   Financial Resource Strain: Not on file  Food Insecurity: Not on file  Transportation Needs: Not on file  Physical Activity: Not on file  Stress: Not on file  Social Connections: Not on file    Tobacco Counseling Counseling given: Not Answered    Diabetic? No         Activities of Daily Living In your present state of health, do you have any difficulty performing the following activities: 10/09/2020 03/13/2020  Hearing? N N  Vision? Y N  Difficulty concentrating or making decisions? N N  Walking or climbing stairs? N N  Dressing or bathing? N N  Doing errands, shopping? N N  Some recent data might be hidden    Patient Care Team: Lorrene Reid, PA-C as PCP - General (Physician Assistant) Wyatt Portela, MD as Consulting Physician (Oncology) Janyth Contes, MD as Consulting Physician (Obstetrics and Gynecology) Devra Dopp, MD as Referring Physician (Dermatology) Rutherford Guys, MD as Consulting Physician (Ophthalmology) Wyatt Portela, MD as Consulting Physician (Oncology)  Indicate any recent Medical Services you may have received from other than [facility] providers in the past year (date may be approximate).     Assessment:   This is a routine wellness examination for Jenny Soto.  Hearing/Vision screen No exam data present  Dietary issues and exercise activities discussed: -Follow a heart healthy diet and reduce saturated and trans fats, monitor/reduce carbohydrates and glucose. Continue to stay active.  Goals   None    Depression Screen PHQ 2/9 Scores 10/09/2020 03/13/2020 08/06/2018 10/02/2016  PHQ - 2 Score 0 0 0 0  PHQ- 9 Score 0 0 0 -    Fall Risk Fall Risk  10/09/2020 03/13/2020 08/06/2018 10/02/2016  Falls in the past year? 0 0 1 No  Number falls in past yr: - - 0 -  Follow up Falls evaluation  completed Falls evaluation completed - -    FALL RISK PREVENTION PERTAINING TO THE HOME:  Any stairs in or around the home? No  If so, are there any without handrails? N/A Home free of loose throw rugs in walkways, pet beds, electrical cords, etc? Yes  Adequate lighting in your home to reduce risk of falls? Yes   ASSISTIVE DEVICES UTILIZED TO PREVENT FALLS:  Life alert? No  Use of a cane, walker or w/c? No  Grab bars in the bathroom? No  Shower chair  or bench in shower? No  Elevated toilet seat or a handicapped toilet? No   TIMED UP AND GO:  Was the test performed? Yes .  Length of time to ambulate 10 feet: 9 sec.   Gait steady and fast without use of assistive device  Cognitive Function: wnl     6CIT Screen 10/09/2020  What Year? 0 points  What month? 0 points  What time? 0 points  Count back from 20 0 points  Months in reverse 0 points  Repeat phrase 0 points  Total Score 0    Immunizations Immunization History  Administered Date(s) Administered  . 19-influenza Whole 06/09/2019  . Influenza, High Dose Seasonal PF 06/09/2019  . Influenza, Seasonal, Injecte, Preservative Fre 06/22/2014  . Influenza,inj,quad, With Preservative 06/11/2018  . Influenza-Unspecified 07/03/2015, 07/05/2016, 06/09/2018, 05/29/2020  . PFIZER(Purple Top)SARS-COV-2 Vaccination 09/30/2019, 10/25/2019, 06/13/2020  . Pneumococcal Conjugate-13 07/03/2015  . Pneumococcal Polysaccharide-23 08/12/2018  . Tdap 10/02/2016  . Zoster 07/03/2015  . Zoster Recombinat (Shingrix) 08/12/2018, 11/04/2018    TDAP status: Up to date  Flu Vaccine status: Up to date  Pneumococcal vaccine status: Up to date  Covid-19 vaccine status: Completed vaccines  Qualifies for Shingles Vaccine? Yes   Zostavax completed Yes   Shingrix Completed?: Yes  Screening Tests Health Maintenance  Topic Date Due  . DEXA SCAN  03/02/2018  . MAMMOGRAM  09/15/2020  . COVID-19 Vaccine (4 - Booster for Pfizer series)  12/12/2020  . TETANUS/TDAP  10/02/2026  . COLONOSCOPY (Pts 45-68yrs Insurance coverage will need to be confirmed)  12/03/2026  . INFLUENZA VACCINE  Completed  . Hepatitis C Screening  Completed  . PNA vac Low Risk Adult  Completed    Health Maintenance  Health Maintenance Due  Topic Date Due  . DEXA SCAN  03/02/2018  . MAMMOGRAM  09/15/2020   Colonoscopy: Completed 12/02/2016 rpt 10 yrs  Mammogram status: Ordered today. Pt provided with contact info and advised to call to schedule appt.   Bone Density status: Ordered today. Pt provided with contact info and advised to call to schedule appt.  Lung Cancer Screening: (Low Dose CT Chest recommended if Age 44-80 years, 30 pack-year currently smoking OR have quit w/in 15years.) does not qualify.   Lung Cancer Screening Referral:   Additional Screening:  Hepatitis C Screening: does qualify; Completed 10/02/2016  Vision Screening: Recommended annual ophthalmology exams for early detection of glaucoma and other disorders of the eye. Is the patient up to date with their annual eye exam?  Yes  Who is the provider or what is the name of the office in which the patient attends annual eye exams?  If pt is not established with a provider, would they like to be referred to a provider to establish care? No .   Dental Screening: Recommended annual dental exams for proper oral hygiene  Community Resource Referral / Chronic Care Management: CRR required this visit?  No   CCM required this visit?  No      Plan:  -Continue current medication regimen. -Discussed most recent labs, which are essentially within normal limits or stable from prior. Patient reports not as diligent with heart healthy diet. -Follow up in 12 months for MCW and FBW or sooner if needed.  I have personally reviewed and noted the following in the patient's chart:   . Medical and social history . Use of alcohol, tobacco or illicit drugs  . Current medications and  supplements . Functional ability and status . Nutritional status .  Physical activity . Advanced directives . List of other physicians . Hospitalizations, surgeries, and ER visits in previous 12 months . Vitals . Screenings to include cognitive, depression, and falls . Referrals and appointments  In addition, I have reviewed and discussed with patient certain preventive protocols, quality metrics, and best practice recommendations. A written personalized care plan for preventive services as well as general preventive health recommendations were provided to patient.

## 2020-10-09 NOTE — Patient Instructions (Signed)
Preventive Care 68 Years and Older, Female Preventive care refers to lifestyle choices and visits with your health care provider that can promote health and wellness. This includes:  A yearly physical exam. This is also called an annual wellness visit.  Regular dental and eye exams.  Immunizations.  Screening for certain conditions.  Healthy lifestyle choices, such as: ? Eating a healthy diet. ? Getting regular exercise. ? Not using drugs or products that contain nicotine and tobacco. ? Limiting alcohol use. What can I expect for my preventive care visit? Physical exam Your health care provider will check your:  Height and weight. These may be used to calculate your BMI (body mass index). BMI is a measurement that tells if you are at a healthy weight.  Heart rate and blood pressure.  Body temperature.  Skin for abnormal spots. Counseling Your health care provider may ask you questions about your:  Past medical problems.  Family's medical history.  Alcohol, tobacco, and drug use.  Emotional well-being.  Home life and relationship well-being.  Sexual activity.  Diet, exercise, and sleep habits.  History of falls.  Memory and ability to understand (cognition).  Work and work Statistician.  Pregnancy and menstrual history.  Access to firearms. What immunizations do I need? Vaccines are usually given at various ages, according to a schedule. Your health care provider will recommend vaccines for you based on your age, medical history, and lifestyle or other factors, such as travel or where you work.   What tests do I need? Blood tests  Lipid and cholesterol levels. These may be checked every 5 years, or more often depending on your overall health.  Hepatitis C test.  Hepatitis B test. Screening  Lung cancer screening. You may have this screening every year starting at age 74 if you have a 30-pack-year history of smoking and currently smoke or have quit within  the past 15 years.  Colorectal cancer screening. ? All adults should have this screening starting at age 44 and continuing until age 58. ? Your health care provider may recommend screening at age 2 if you are at increased risk. ? You will have tests every 1-10 years, depending on your results and the type of screening test.  Diabetes screening. ? This is done by checking your blood sugar (glucose) after you have not eaten for a while (fasting). ? You may have this done every 1-3 years.  Mammogram. ? This may be done every 1-2 years. ? Talk with your health care provider about how often you should have regular mammograms.  Abdominal aortic aneurysm (AAA) screening. You may need this if you are a current or former smoker.  BRCA-related cancer screening. This may be done if you have a family history of breast, ovarian, tubal, or peritoneal cancers. Other tests  STD (sexually transmitted disease) testing, if you are at risk.  Bone density scan. This is done to screen for osteoporosis. You may have this done starting at age 104. Talk with your health care provider about your test results, treatment options, and if necessary, the need for more tests. Follow these instructions at home: Eating and drinking  Eat a diet that includes fresh fruits and vegetables, whole grains, lean protein, and low-fat dairy products. Limit your intake of foods with high amounts of sugar, saturated fats, and salt.  Take vitamin and mineral supplements as recommended by your health care provider.  Do not drink alcohol if your health care provider tells you not to drink.  If you drink alcohol: ? Limit how much you have to 0-1 drink a day. ? Be aware of how much alcohol is in your drink. In the [place] one drink equals one 12 oz bottle of beer (355 mL), one 5 oz glass of wine (148 mL), or one 1 oz glass of hard liquor (44 mL).   Lifestyle  Take daily care of your teeth and gums. Brush your teeth every morning  and night with fluoride toothpaste. Floss one time each day.  Stay active. Exercise for at least 30 minutes 5 or more days each week.  Do not use any products that contain nicotine or tobacco, such as cigarettes, e-cigarettes, and chewing tobacco. If you need help quitting, ask your health care provider.  Do not use drugs.  If you are sexually active, practice safe sex. Use a condom or other form of protection in order to prevent STIs (sexually transmitted infections).  Talk with your health care provider about taking a low-dose aspirin or statin.  Find healthy ways to cope with stress, such as: ? Meditation, yoga, or listening to music. ? Journaling. ? Talking to a trusted person. ? Spending time with friends and family. Safety  Always wear your seat belt while driving or riding in a vehicle.  Do not drive: ? If you have been drinking alcohol. Do not ride with someone who has been drinking. ? When you are tired or distracted. ? While texting.  Wear a helmet and other protective equipment during sports activities.  If you have firearms in your house, make sure you follow all gun safety procedures. What's next?  Visit your health care provider once a year for an annual wellness visit.  Ask your health care provider how often you should have your eyes and teeth checked.  Stay up to date on all vaccines. This information is not intended to replace advice given to you by your health care provider. Make sure you discuss any questions you have with your health care provider. Document Revised: 08/02/2020 Document Reviewed: 08/06/2018 Elsevier Patient Education  2021 [employer].

## 2020-10-11 ENCOUNTER — Ambulatory Visit
Admission: RE | Admit: 2020-10-11 | Discharge: 2020-10-11 | Disposition: A | Discharge status: Home / Self Care | Payer: Medicare PPO | Source: Ambulatory Visit | Attending: Physician Assistant | Admitting: Physician Assistant

## 2020-10-11 ENCOUNTER — Other Ambulatory Visit: Payer: Self-pay | Admitting: Physician Assistant

## 2020-10-11 ENCOUNTER — Other Ambulatory Visit: Payer: Self-pay

## 2020-10-11 DIAGNOSIS — Z1231 Encounter for screening mammogram for malignant neoplasm of breast: Secondary | ICD-10-CM

## 2020-10-11 DIAGNOSIS — Z780 Asymptomatic menopausal state: Secondary | ICD-10-CM

## 2020-10-11 DIAGNOSIS — E2839 Other primary ovarian failure: Secondary | ICD-10-CM

## 2020-10-12 ENCOUNTER — Other Ambulatory Visit: Payer: Medicare PPO

## 2020-10-21 ENCOUNTER — Other Ambulatory Visit: Payer: Self-pay

## 2020-10-21 ENCOUNTER — Ambulatory Visit
Admission: RE | Admit: 2020-10-21 | Discharge: 2020-10-21 | Disposition: A | Discharge status: Home / Self Care | Payer: Medicare PPO | Source: Ambulatory Visit | Attending: Physician Assistant | Admitting: Physician Assistant

## 2020-10-21 DIAGNOSIS — M810 Age-related osteoporosis without current pathological fracture: Secondary | ICD-10-CM | POA: Diagnosis not present

## 2020-10-21 DIAGNOSIS — Z780 Asymptomatic menopausal state: Secondary | ICD-10-CM | POA: Diagnosis not present

## 2020-10-21 DIAGNOSIS — E2839 Other primary ovarian failure: Secondary | ICD-10-CM

## 2020-11-09 ENCOUNTER — Other Ambulatory Visit: Payer: Self-pay | Admitting: Physician Assistant

## 2020-11-09 DIAGNOSIS — E7800 Pure hypercholesterolemia, unspecified: Secondary | ICD-10-CM

## 2020-11-16 ENCOUNTER — Ambulatory Visit: Payer: Medicare PPO | Admitting: Physician Assistant

## 2021-01-08 DIAGNOSIS — H40013 Open angle with borderline findings, low risk, bilateral: Secondary | ICD-10-CM | POA: Diagnosis not present

## 2021-02-27 ENCOUNTER — Other Ambulatory Visit: Payer: Self-pay | Admitting: Physician Assistant

## 2021-02-27 DIAGNOSIS — E7800 Pure hypercholesterolemia, unspecified: Secondary | ICD-10-CM

## 2021-02-27 MED ORDER — ROSUVASTATIN CALCIUM 10 MG PO TABS: 10.0000 mg | ORAL_TABLET | ORAL | 0 refills | Status: DC

## 2021-05-21 ENCOUNTER — Encounter: Payer: Self-pay | Admitting: Physician Assistant

## 2021-05-21 DIAGNOSIS — E7800 Pure hypercholesterolemia, unspecified: Secondary | ICD-10-CM

## 2021-05-22 MED ORDER — ROSUVASTATIN CALCIUM 10 MG PO TABS: 10.0000 mg | ORAL_TABLET | ORAL | 0 refills | Status: DC

## 2021-05-29 ENCOUNTER — Encounter: Payer: Self-pay | Admitting: Oncology

## 2021-06-01 ENCOUNTER — Telehealth: Payer: Self-pay | Admitting: Oncology

## 2021-06-01 NOTE — Telephone Encounter (Signed)
Scheduled per 10/06 staff message, patient has been called and notified.

## 2021-06-26 ENCOUNTER — Other Ambulatory Visit: Payer: Medicare PPO

## 2021-06-26 ENCOUNTER — Other Ambulatory Visit: Payer: Self-pay | Admitting: Oncology

## 2021-06-26 DIAGNOSIS — C649 Malignant neoplasm of unspecified kidney, except renal pelvis: Secondary | ICD-10-CM

## 2021-06-27 ENCOUNTER — Ambulatory Visit (HOSPITAL_COMMUNITY)
Admission: RE | Admit: 2021-06-27 | Discharge: 2021-06-27 | Disposition: A | Discharge status: Home / Self Care | Payer: Medicare PPO | Source: Ambulatory Visit | Attending: Oncology | Admitting: Oncology

## 2021-06-27 ENCOUNTER — Other Ambulatory Visit: Payer: Self-pay

## 2021-06-27 ENCOUNTER — Inpatient Hospital Stay: Payer: Medicare PPO | Attending: Oncology

## 2021-06-27 DIAGNOSIS — Z9081 Acquired absence of spleen: Secondary | ICD-10-CM | POA: Diagnosis not present

## 2021-06-27 DIAGNOSIS — C649 Malignant neoplasm of unspecified kidney, except renal pelvis: Secondary | ICD-10-CM | POA: Insufficient documentation

## 2021-06-27 DIAGNOSIS — N858 Other specified noninflammatory disorders of uterus: Secondary | ICD-10-CM | POA: Diagnosis not present

## 2021-06-27 DIAGNOSIS — I700 Atherosclerosis of aorta: Secondary | ICD-10-CM | POA: Insufficient documentation

## 2021-06-27 DIAGNOSIS — Z905 Acquired absence of kidney: Secondary | ICD-10-CM | POA: Diagnosis not present

## 2021-06-27 DIAGNOSIS — Z902 Acquired absence of lung [part of]: Secondary | ICD-10-CM | POA: Insufficient documentation

## 2021-06-27 DIAGNOSIS — K8689 Other specified diseases of pancreas: Secondary | ICD-10-CM | POA: Diagnosis not present

## 2021-06-27 DIAGNOSIS — R911 Solitary pulmonary nodule: Secondary | ICD-10-CM | POA: Insufficient documentation

## 2021-06-27 DIAGNOSIS — K769 Liver disease, unspecified: Secondary | ICD-10-CM | POA: Diagnosis not present

## 2021-06-27 DIAGNOSIS — Z85528 Personal history of other malignant neoplasm of kidney: Secondary | ICD-10-CM | POA: Insufficient documentation

## 2021-06-27 LAB — CBC WITH DIFFERENTIAL (CANCER CENTER ONLY)
Abs Immature Granulocytes: 0.01 10*3/uL (ref 0.00–0.07)
Basophils Absolute: 0.1 10*3/uL (ref 0.0–0.1)
Basophils Relative: 1 %
Eosinophils Absolute: 0.2 10*3/uL (ref 0.0–0.5)
Eosinophils Relative: 3 %
HCT: 40.2 % (ref 36.0–46.0)
Hemoglobin: 12.9 g/dL (ref 12.0–15.0)
Immature Granulocytes: 0 %
Lymphocytes Relative: 32 %
Lymphs Abs: 2 10*3/uL (ref 0.7–4.0)
MCH: 31.9 pg (ref 26.0–34.0)
MCHC: 32.1 g/dL (ref 30.0–36.0)
MCV: 99.5 fL (ref 80.0–100.0)
Monocytes Absolute: 0.5 10*3/uL (ref 0.1–1.0)
Monocytes Relative: 9 %
Neutro Abs: 3.4 10*3/uL (ref 1.7–7.7)
Neutrophils Relative %: 55 %
Platelet Count: 269 10*3/uL (ref 150–400)
RBC: 4.04 MIL/uL (ref 3.87–5.11)
RDW: 13.5 % (ref 11.5–15.5)
WBC Count: 6.3 10*3/uL (ref 4.0–10.5)
nRBC: 0 % (ref 0.0–0.2)

## 2021-06-27 LAB — CMP (CANCER CENTER ONLY)
ALT: 20 U/L (ref 0–44)
AST: 20 U/L (ref 15–41)
Albumin: 4 g/dL (ref 3.5–5.0)
Alkaline Phosphatase: 56 U/L (ref 38–126)
Anion gap: 10 (ref 5–15)
BUN: 16 mg/dL (ref 8–23)
CO2: 24 mmol/L (ref 22–32)
Calcium: 9.8 mg/dL (ref 8.9–10.3)
Chloride: 107 mmol/L (ref 98–111)
Creatinine: 0.88 mg/dL (ref 0.44–1.00)
GFR, Estimated: >60 mL/min (ref >60)
Glucose, Bld: 92 mg/dL (ref 70–99)
Potassium: 3.9 mmol/L (ref 3.5–5.1)
Sodium: 141 mmol/L (ref 135–145)
Total Bilirubin: 0.5 mg/dL (ref 0.3–1.2)
Total Protein: 7.3 g/dL (ref 6.5–8.1)

## 2021-06-27 MED ORDER — IOHEXOL 350 MG/ML SOLN
80.0000 mL | Freq: Once | INTRAVENOUS | Status: AC | PRN
  Administered 2021-06-27: 80 mL via INTRAVENOUS

## 2021-06-29 ENCOUNTER — Inpatient Hospital Stay (HOSPITAL_BASED_OUTPATIENT_CLINIC_OR_DEPARTMENT_OTHER): Payer: Medicare PPO | Admitting: Oncology

## 2021-06-29 VITALS — BP 138/77 | HR 77 | Temp 97.9°F | Resp 20 | Wt 139.3 lb

## 2021-06-29 DIAGNOSIS — Z905 Acquired absence of kidney: Secondary | ICD-10-CM | POA: Diagnosis not present

## 2021-06-29 DIAGNOSIS — Z85528 Personal history of other malignant neoplasm of kidney: Secondary | ICD-10-CM | POA: Diagnosis not present

## 2021-06-29 DIAGNOSIS — C649 Malignant neoplasm of unspecified kidney, except renal pelvis: Secondary | ICD-10-CM | POA: Diagnosis not present

## 2021-06-29 NOTE — Progress Notes (Signed)
Hematology and Oncology Follow Up for Telemedicine Visits  Jenny Soto [id number] July 05, 1951 70 y.o. 06/29/2021 2:32 PM Jesusita Oka, PA-C   I connected with Jenny Soto on 06/29/21 at  3:30 PM EDT by telephone visit and verified that I am speaking    Principle Diagnosis: 59 year old woman with kidney cancer diagnosed in 2006.  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   Prior Therapy:  Status post left nephrectomy in 2006.   The patient underwent surgical resection, VATS procedure and wedge resection of the right lower lobe done in 2009.  The pathology showed recurrence of her original renal cell carcinoma. She is status post cryoablation of a right renal mass in February 2019.   Current therapy: Active surveillance.    Interim History: Jenny Soto presents today for a follow-up visit.  Since her last visit, she reports feeling well without any major complaints.  She denies any nausea, vomiting or abdominal pain.  She denies any recent hospitalizations or illnesses.  Her performance status and quality of life remained excellent.     Medications: Updated on review. Current Outpatient Medications  Medication Sig Dispense Refill   rosuvastatin (CRESTOR) 10 MG tablet Take 1 tablet (10 mg total) by mouth 2 (two) times a week. 30 tablet 0   No current facility-administered medications for this visit.     Allergies:  Allergies  Allergen Reactions   Oxycodone-Acetaminophen Nausea And Vomiting    Physical exam: Blood pressure 138/77, pulse 77, temperature 97.9 F (36.6 C), resp. rate 20, weight 139 lb 4.8 oz (63.2 kg), SpO2 100 %. ECOG 0 General appearance: Comfortable appearing without any discomfort Head: Normocephalic without any trauma Oropharynx: Mucous membranes are moist and pink without any thrush or ulcers. Eyes:  Pupils are equal and round reactive to light. Lymph nodes: No cervical, supraclavicular, inguinal or axillary lymphadenopathy.   Heart:regular rate and rhythm.  S1 and S2 without leg edema. Lung: Clear without any rhonchi or wheezes.  No dullness to percussion. Abdomin: Soft, nontender, nondistended with good bowel sounds.  No hepatosplenomegaly. Musculoskeletal: No joint deformity or effusion.  Full range of motion noted. Neurological: No deficits noted on motor, sensory and deep tendon reflex exam. Skin: No petechial rash or dryness.  Appeared moist.      Lab Results: Lab Results  Component Value Date   WBC 6.3 06/27/2021   HGB 12.9 06/27/2021   HCT 40.2 06/27/2021   MCV 99.5 06/27/2021   PLT 269 06/27/2021     Chemistry      Component Value Date/Time   NA 141 06/27/2021 1046   NA 141 10/06/2020 0925   NA 140 08/08/2017 1122   K 3.9 06/27/2021 1046   K 4.5 08/08/2017 1122   CL 107 06/27/2021 1046   CL 105 11/11/2012 0956   CO2 24 06/27/2021 1046   CO2 23 08/08/2017 1122   BUN 16 06/27/2021 1046   BUN 10 10/06/2020 0925   BUN 13.8 08/08/2017 1122   CREATININE 0.88 06/27/2021 1046   CREATININE 1.0 08/08/2017 1122      Component Value Date/Time   CALCIUM 9.8 06/27/2021 1046   CALCIUM 10.0 08/08/2017 1122   ALKPHOS 56 06/27/2021 1046   ALKPHOS 74 08/08/2017 1122   AST 20 06/27/2021 1046   AST 19 08/08/2017 1122   ALT 20 06/27/2021 1046   ALT 19 08/08/2017 1122  BILITOT 0.5 06/27/2021 1046   BILITOT 0.48 08/08/2017 1122       IMPRESSION: 1. Status post left radical nephrectomy and splenectomy, as well as cryoablation to the upper pole of the right kidney. No findings to suggest locally recurrent disease or definite metastatic disease in the chest, abdomen or pelvis. 2. Status post right lower lobe wedge resection with no evidence of locally recurrent disease adjacent to the suture line. 3. Aortic atherosclerosis. 4. Additional incidental findings, similar  to prior studies, as above.  Impression and Plan:  70 year old woman with   1.    Kidney cancer diagnosed in 2006.  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    For disease status was updated at this time including imaging studies obtained on June 27, 2021.  At this time, I see no evidence of relapse or need for intervention.  I recommended active surveillance and repeat surgical intervention if she has isolated metastasis.  Systemic therapy will be deferred to a later date at that time.  He is agreeable to continue with this plan.  2.  Right kidney neoplasm: She is status post ablation and no evidence of relapse at this time.  3.  Vaccination considerations: She is up-to-date on her vaccination series including COVID booster and flu vaccine.   4.  Follow-up: In 1 year for repeat follow-up.  30 minutes were dedicated to this visit. Time was spent on reviewing imaging studies, laboratory data, treatment choices and future plan of care discussion.   Zola Button, MD 06/29/2021 2:32 PM

## 2021-07-03 ENCOUNTER — Ambulatory Visit: Payer: Medicare PPO | Admitting: Oncology

## 2021-08-23 ENCOUNTER — Other Ambulatory Visit: Payer: Self-pay | Admitting: Physician Assistant

## 2021-08-23 DIAGNOSIS — E7800 Pure hypercholesterolemia, unspecified: Secondary | ICD-10-CM

## 2021-10-05 ENCOUNTER — Encounter: Payer: Self-pay | Admitting: Physician Assistant

## 2021-10-09 ENCOUNTER — Other Ambulatory Visit: Payer: Self-pay

## 2021-10-09 DIAGNOSIS — Z0000 Encounter for general adult medical examination without abnormal findings: Secondary | ICD-10-CM

## 2021-10-09 DIAGNOSIS — E7800 Pure hypercholesterolemia, unspecified: Secondary | ICD-10-CM

## 2021-10-09 DIAGNOSIS — Z79899 Other long term (current) drug therapy: Secondary | ICD-10-CM

## 2021-10-09 DIAGNOSIS — E785 Hyperlipidemia, unspecified: Secondary | ICD-10-CM

## 2021-10-11 ENCOUNTER — Ambulatory Visit: Payer: Medicare PPO | Admitting: Physician Assistant

## 2021-10-12 ENCOUNTER — Other Ambulatory Visit: Payer: Self-pay

## 2021-10-12 ENCOUNTER — Other Ambulatory Visit: Payer: Medicare PPO

## 2021-10-12 DIAGNOSIS — E785 Hyperlipidemia, unspecified: Secondary | ICD-10-CM

## 2021-10-12 DIAGNOSIS — Z79899 Other long term (current) drug therapy: Secondary | ICD-10-CM

## 2021-10-12 DIAGNOSIS — E7800 Pure hypercholesterolemia, unspecified: Secondary | ICD-10-CM

## 2021-10-12 DIAGNOSIS — Z0000 Encounter for general adult medical examination without abnormal findings: Secondary | ICD-10-CM | POA: Diagnosis not present

## 2021-10-13 LAB — LIPID PANEL
Chol/HDL Ratio: 2.5 ratio (ref 0.0–4.4)
Cholesterol, Total: 198 mg/dL (ref 100–199)
HDL: 80 mg/dL (ref >39)
LDL Chol Calc (NIH): 104 mg/dL — ABNORMAL HIGH (ref 0–99)
Triglycerides: 76 mg/dL (ref 0–149)
VLDL Cholesterol Cal: 14 mg/dL (ref 5–40)

## 2021-10-13 LAB — COMPREHENSIVE METABOLIC PANEL
ALT: 28 IU/L (ref 0–32)
AST: 27 IU/L (ref 0–40)
Albumin/Globulin Ratio: 2 (ref 1.2–2.2)
Albumin: 4.5 g/dL (ref 3.8–4.8)
Alkaline Phosphatase: 64 IU/L (ref 44–121)
BUN/Creatinine Ratio: 15 (ref 12–28)
BUN: 15 mg/dL (ref 8–27)
Bilirubin Total: 0.6 mg/dL (ref 0.0–1.2)
CO2: 23 mmol/L (ref 20–29)
Calcium: 10.4 mg/dL — ABNORMAL HIGH (ref 8.7–10.3)
Chloride: 104 mmol/L (ref 96–106)
Creatinine, Ser: 1.01 mg/dL — ABNORMAL HIGH (ref 0.57–1.00)
Globulin, Total: 2.3 g/dL (ref 1.5–4.5)
Glucose: 115 mg/dL — ABNORMAL HIGH (ref 70–99)
Potassium: 4.6 mmol/L (ref 3.5–5.2)
Sodium: 141 mmol/L (ref 134–144)
Total Protein: 6.8 g/dL (ref 6.0–8.5)
eGFR: 61 mL/min/{1.73_m2} (ref >59)

## 2021-10-13 LAB — CBC WITH DIFFERENTIAL/PLATELET
Basophils Absolute: 0.1 10*3/uL (ref 0.0–0.2)
Basos: 1 %
EOS (ABSOLUTE): 0.3 10*3/uL (ref 0.0–0.4)
Eos: 5 %
Hematocrit: 41.4 % (ref 34.0–46.6)
Hemoglobin: 13.6 g/dL (ref 11.1–15.9)
Immature Grans (Abs): 0 10*3/uL (ref 0.0–0.1)
Immature Granulocytes: 0 %
Lymphocytes Absolute: 2.5 10*3/uL (ref 0.7–3.1)
Lymphs: 44 %
MCH: 31.9 pg (ref 26.6–33.0)
MCHC: 32.9 g/dL (ref 31.5–35.7)
MCV: 97 fL (ref 79–97)
Monocytes Absolute: 0.5 10*3/uL (ref 0.1–0.9)
Monocytes: 9 %
Neutrophils Absolute: 2.4 10*3/uL (ref 1.4–7.0)
Neutrophils: 41 %
Platelets: 300 10*3/uL (ref 150–450)
RBC: 4.27 x10E6/uL (ref 3.77–5.28)
RDW: 13.1 % (ref 11.7–15.4)
WBC: 5.8 10*3/uL (ref 3.4–10.8)

## 2021-10-13 LAB — HEMOGLOBIN A1C
Est. average glucose Bld gHb Est-mCnc: 117 mg/dL
Hgb A1c MFr Bld: 5.7 % — ABNORMAL HIGH (ref 4.8–5.6)

## 2021-10-13 LAB — TSH: TSH: 1.9 u[IU]/mL (ref 0.450–4.500)

## 2021-10-15 ENCOUNTER — Other Ambulatory Visit: Payer: Self-pay

## 2021-10-15 ENCOUNTER — Ambulatory Visit (INDEPENDENT_AMBULATORY_CARE_PROVIDER_SITE_OTHER): Payer: Medicare PPO | Admitting: Physician Assistant

## 2021-10-15 ENCOUNTER — Encounter: Payer: Self-pay | Admitting: Physician Assistant

## 2021-10-15 VITALS — BP 136/75 | HR 64 | Temp 97.7°F | Ht 65.0 in | Wt 140.0 lb

## 2021-10-15 DIAGNOSIS — R7303 Prediabetes: Secondary | ICD-10-CM | POA: Diagnosis not present

## 2021-10-15 DIAGNOSIS — L821 Other seborrheic keratosis: Secondary | ICD-10-CM | POA: Insufficient documentation

## 2021-10-15 DIAGNOSIS — E7800 Pure hypercholesterolemia, unspecified: Secondary | ICD-10-CM | POA: Diagnosis not present

## 2021-10-15 DIAGNOSIS — C4491 Basal cell carcinoma of skin, unspecified: Secondary | ICD-10-CM | POA: Insufficient documentation

## 2021-10-15 DIAGNOSIS — D1801 Hemangioma of skin and subcutaneous tissue: Secondary | ICD-10-CM | POA: Insufficient documentation

## 2021-10-15 DIAGNOSIS — Z0000 Encounter for general adult medical examination without abnormal findings: Secondary | ICD-10-CM | POA: Diagnosis not present

## 2021-10-15 NOTE — Progress Notes (Signed)
Subjective:   Jenny Soto is a 71 y.o. female who presents for Medicare Annual (Subsequent) preventive examination.  Review of Systems    General:   No F/C, wt loss Pulm:   No DIB, SOB, pleuritic chest pain Card:  No CP, palpitations Abd:  No n/v/d or pain Ext:  No inc edema from baseline    Objective:    Today's Vitals   10/15/21 1438  BP: 136/75  Pulse: 64  Temp: 97.7 F (36.5 C)  SpO2: 98%  Weight: 140 lb (63.5 kg)  Height: 5\' 5"  (1.651 m)   Body mass index is 23.3 kg/m.  Advanced Directives 02/12/2018 09/26/2017 09/26/2017 09/23/2017 12/02/2016 11/18/2016 10/02/2016  Does Patient Have a Medical Advance Directive? No Yes Yes Yes Yes Yes Yes  Type of Advance Directive - Living will Living will Living will [place];Living will [place];Living will [place];Living will;Out of facility DNR (pink MOST or yellow form)  Does patient want to make changes to medical advance directive? - No - Patient declined - - - - -  Copy of [place] in Chart? - No - copy requested No - copy requested No - copy requested No - copy requested - -  Would patient like information on creating a medical advance directive? No - Patient declined - - - - - -    Current Medications (verified) Outpatient Encounter Medications as of 10/15/2021  Medication Sig   rosuvastatin (CRESTOR) 10 MG tablet TAKE 1 TABLET BY MOUTH 2 TIMES A WEEK.   No facility-administered encounter medications on file as of 10/15/2021.    Allergies (verified) Oxycodone-acetaminophen   History: Past Medical History:  Diagnosis Date   Cancer ([place]) [phone]   kidney, lung   Gallbladder sludge    History of hiatal hernia    Hyperlipidemia    PONV (postoperative nausea and vomiting)    Right renal mass    Past Surgical History:  Procedure Laterality Date   BLADDER SUSPENSION  july 2011 and nov 2011   [place] N/A    [employer]reesia 10/06/2020   INCISIONAL HERNIA REPAIR N/A 10/04/2015   Procedure: LAPAROSCOPIC INCISIONAL HERNIA;  Surgeon: Ralene Ok, MD;  Location: [facility];  Service: General;  Laterality: N/A;   INSERTION OF MESH N/A 10/04/2015   Procedure: WITH INSERTION OF MESH;  Surgeon: Ralene Ok, MD;  Location: [facility];  Service: General;  Laterality: N/A;   IR RADIOLOGIST EVAL & MGMT  09/10/2017   IR RADIOLOGIST EVAL & MGMT  10/28/2017   IR RADIOLOGIST EVAL & MGMT  01/27/2018   LAPAROSCOPIC CHOLECYSTECTOMY  05/29/11   LOBECTOMY  2009   right lower lung   NEPHRECTOMY  october 2006   left kidney   NOVASURE ABLATION  2005   POLYPECTOMY  1985   vocal cord   RADIOFREQUENCY ABLATION Right 09/26/2017   Procedure: CT RENAL CRYOABLATION;  Surgeon: Markus Daft, MD;  Location: [facility];  Service: Anesthesiology;  Laterality: Right;   RHINOPLASTY  1985   SPLENECTOMY, TOTAL  october 2006   TUBAL LIGATION  1983   TUMOR REMOVAL  1992   ovarian durmoid tumors bilateral   Family History  Adopted: Yes  Problem Relation Age of Onset   Heart disease Mother    Colon cancer Neg Hx    Social History   Socioeconomic History   Marital status: Married  Spouse name: Not on file   Number of children: Not on file   Years of education: Not on file   Highest education level: Not on file  Occupational History   Not on file  Tobacco Use   Smoking status: Former    Packs/day: 0.50    Years: 20.00    Pack years: 10.00    Types: Cigarettes    Quit date: 08/26/2004    Years since quitting: 17.1   Smokeless tobacco: Never  Vaping Use   Vaping Use: Never used  Substance and Sexual Activity   Alcohol use: Yes    Alcohol/week: 6.0 standard drinks    Types: 3 Glasses of wine, 3 Cans of beer per week    Comment: wine or beer every night   Drug use: No   Sexual activity: Yes    Birth control/protection: Surgical  Other Topics Concern   Not on file  Social History Narrative   Not on file    Social Determinants of Health   Financial Resource Strain: Not on file  Food Insecurity: Not on file  Transportation Needs: Not on file  Physical Activity: Not on file  Stress: Not on file  Social Connections: Not on file    Tobacco Counseling Counseling given: Not Answered    Diabetic? no         Activities of Daily Living In your present state of health, do you have any difficulty performing the following activities: 10/15/2021  Hearing? N  Vision? N  Difficulty concentrating or making decisions? N  Walking or climbing stairs? N  Dressing or bathing? N  Doing errands, shopping? N  Some recent data might be hidden    Patient Care Team: Lorrene Reid, PA-C as PCP - General (Physician Assistant) Wyatt Portela, MD as Consulting Physician (Oncology) Janyth Contes, MD as Consulting Physician (Obstetrics and Gynecology) Devra Dopp, MD as Referring Physician (Dermatology) Rutherford Guys, MD as Consulting Physician (Ophthalmology) Wyatt Portela, MD as Consulting Physician (Oncology)  Indicate any recent Medical Services you may have received from other than [facility] providers in the past year (date may be approximate).     Assessment:   This is a routine wellness examination for Jenny Soto.  Hearing/Vision screen No results found.  Dietary issues and exercise activities discussed: -Pt staying active with walking on a trail around her property and toning exercises. Continues to monitor fat intake, limits sweets but could do better with starches.    Goals Addressed   None   Depression Screen PHQ 2/9 Scores 10/15/2021 10/09/2020 03/13/2020 08/06/2018 10/02/2016  PHQ - 2 Score 0 0 0 0 0  PHQ- 9 Score 0 0 0 0 -    Fall Risk Fall Risk  10/15/2021 10/09/2020 03/13/2020 08/06/2018 10/02/2016  Falls in the past year? 0 0 0 1 No  Number falls in past yr: 0 - - 0 -  Injury with Fall? 0 - - - -  Risk for fall due to : No Fall Risks - - - -  Follow up Falls  evaluation completed Falls evaluation completed Falls evaluation completed - -    FALL RISK PREVENTION PERTAINING TO THE HOME:  Any stairs in or around the home? Yes  If so, are there any without handrails? No  Home free of loose throw rugs in walkways, pet beds, electrical cords, etc? Yes  Adequate lighting in your home to reduce risk of falls? Yes   ASSISTIVE DEVICES UTILIZED TO PREVENT FALLS:  Life alert?  No  Use of a cane, walker or w/c? No  Grab bars in the bathroom? No  Shower chair or bench in shower? No  Elevated toilet seat or a handicapped toilet? No   TIMED UP AND GO:  Was the test performed? Yes .  Length of time to ambulate 10 feet: 10 sec.   Gait steady and fast without use of assistive device  Cognitive Function: wnl's      6CIT Screen 10/15/2021 10/09/2020  What Year? 0 points 0 points  What month? 0 points 0 points  What time? 0 points 0 points  Count back from 20 0 points 0 points  Months in reverse 0 points 0 points  Repeat phrase 0 points 0 points  Total Score 0 0    Immunizations Immunization History  Administered Date(s) Administered   19-influenza Whole 06/09/2019   Influenza, High Dose Seasonal PF 06/09/2019, 06/28/2021   Influenza, Seasonal, Injecte, Preservative Fre 06/22/2014   Influenza,inj,quad, With Preservative 06/11/2018   Influenza-Unspecified 07/03/2015, 07/05/2016, 06/09/2018, 05/29/2020   PFIZER(Purple Top)SARS-COV-2 Vaccination 09/30/2019, 10/25/2019, 06/13/2020   Pneumococcal Conjugate-13 07/03/2015   Pneumococcal Polysaccharide-23 08/12/2018   Tdap 10/02/2016   Zoster Recombinat (Shingrix) 08/12/2018, 11/04/2018   Zoster, Live 07/03/2015    TDAP status: Up to date  Flu Vaccine status: Up to date  Pneumococcal vaccine status: Up to date  Covid-19 vaccine status: Completed vaccines  Qualifies for Shingles Vaccine? Yes   Zostavax completed No   Shingrix Completed?: Yes  Screening Tests Health Maintenance  Topic Date  Due   COVID-19 Vaccine (4 - Booster for Pfizer series) 08/08/2020   MAMMOGRAM  10/11/2022   TETANUS/TDAP  10/02/2026   COLONOSCOPY (Pts 45-72yrs Insurance coverage will need to be confirmed)  12/03/2026   Pneumonia Vaccine 90+ Years old  Completed   INFLUENZA VACCINE  Completed   DEXA SCAN  Completed   Hepatitis C Screening  Completed   Zoster Vaccines- Shingrix  Completed   HPV VACCINES  Aged Out    Health Maintenance  Health Maintenance Due  Topic Date Due   COVID-19 Vaccine (4 - Booster for [place] series) 08/08/2020    Colorectal cancer screening: Type of screening: Colonoscopy. Completed 12/02/2016. Repeat every 10 years  Mammogram status: Completed 10/14/2020. Repeat every year  Bone Density status: Completed 10/21/2020. Results reflect: Bone density results: OSTEOPOROSIS. Repeat every 2 years.  Lung Cancer Screening: (Low Dose CT Chest recommended if Age 52-80 years, 30 pack-year currently smoking OR have quit w/in 15years.) does not qualify.   Lung Cancer Screening Referral: n/a  Additional Screening:  Hepatitis C Screening: does qualify; Completed 10/02/2016  Vision Screening: Recommended annual ophthalmology exams for early detection of glaucoma and other disorders of the eye. Is the patient up to date with their annual eye exam?  Yes  Who is the provider or what is the name of the office in which the patient attends annual eye exams? [facility], Dr. Nelda Marseille If pt is not established with a provider, would they like to be referred to a provider to establish care? No .   Dental Screening: Recommended annual dental exams for proper oral hygiene  Community Resource Referral / Chronic Care Management: CRR required this visit?  No   CCM required this visit?  No      Plan:  -Discussed with patient most recent lab results which are essentially within normal limits or stable from prior. Pt reports tolerating Crestor 10 mg twice weekly. Previously unable to  tolerate daily due  to arthralgia.  -Continue current medication regimen.  -Follow up in 1 year for [facility] and [facility] few days prior   I have personally reviewed and noted the following in the patients chart:   Medical and social history Use of alcohol, tobacco or illicit drugs  Current medications and supplements including opioid prescriptions.  Functional ability and status Nutritional status Physical activity Advanced directives List of other physicians Hospitalizations, surgeries, and ER visits in previous 12 months Vitals Screenings to include cognitive, depression, and falls Referrals and appointments  In addition, I have reviewed and discussed with patient certain preventive protocols, quality metrics, and best practice recommendations. A written personalized care plan for preventive services as well as general preventive health recommendations were provided to patient.    Lorrene Reid, PA-C   10/15/2021

## 2021-10-15 NOTE — Patient Instructions (Addendum)
Preventive Care 65 Years and Older, Female °Preventive care refers to lifestyle choices and visits with your health care provider that can promote health and wellness. Preventive care visits are also called wellness exams. °What can I expect for my preventive care visit? °Counseling °Your health care provider may ask you questions about your: °Medical history, including: °Past medical problems. °Family medical history. °Pregnancy and menstrual history. °History of falls. °Current health, including: °Memory and ability to understand (cognition). °Emotional well-being. °Home life and relationship well-being. °Sexual activity and sexual health. °Lifestyle, including: °Alcohol, nicotine or tobacco, and drug use. °Access to firearms. °Diet, exercise, and sleep habits. °Work and work environment. °Sunscreen use. °Safety issues such as seatbelt and bike helmet use. °Physical exam °Your health care provider will check your: °Height and weight. These may be used to calculate your BMI (body mass index). BMI is a measurement that tells if you are at a healthy weight. °Waist circumference. This measures the distance around your waistline. This measurement also tells if you are at a healthy weight and may help predict your risk of certain diseases, such as type 2 diabetes and high blood pressure. °Heart rate and blood pressure. °Body temperature. °Skin for abnormal spots. °What immunizations do I need? °Vaccines are usually given at various ages, according to a schedule. Your health care provider will recommend vaccines for you based on your age, medical history, and lifestyle or other factors, such as travel or where you work. °What tests do I need? °Screening °Your health care provider may recommend screening tests for certain conditions. This may include: °Lipid and cholesterol levels. °Hepatitis C test. °Hepatitis B test. °HIV (human immunodeficiency virus) test. °STI (sexually transmitted infection) testing, if you are at  risk. °Lung cancer screening. °Colorectal cancer screening. °Diabetes screening. This is done by checking your blood sugar (glucose) after you have not eaten for a while (fasting). °Mammogram. Talk with your health care provider about how often you should have regular mammograms. °BRCA-related cancer screening. This may be done if you have a family history of breast, ovarian, tubal, or peritoneal cancers. °Bone density scan. This is done to screen for osteoporosis. °Talk with your health care provider about your test results, treatment options, and if necessary, the need for more tests. °Follow these instructions at home: °Eating and drinking ° °Eat a diet that includes fresh fruits and vegetables, whole grains, lean protein, and low-fat dairy products. Limit your intake of foods with high amounts of sugar, saturated fats, and salt. °Take vitamin and mineral supplements as recommended by your health care provider. °Do not drink alcohol if your health care provider tells you not to drink. °If you drink alcohol: °Limit how much you have to 0-1 drink a day. °Know how much alcohol is in your drink. In the [place] one drink equals one 12 oz bottle of beer (355 mL), one 5 oz glass of wine (148 mL), or one 1½ oz glass of hard liquor (44 mL). °Lifestyle °Brush your teeth every morning and night with fluoride toothpaste. Floss one time each day. °Exercise for at least 30 minutes 5 or more days each week. °Do not use any products that contain nicotine or tobacco. These products include cigarettes, chewing tobacco, and vaping devices, such as e-cigarettes. If you need help quitting, ask your health care provider. °Do not use drugs. °If you are sexually active, practice safe sex. Use a condom or other form of protection in order to prevent STIs. °Take aspirin only as told by your   health care provider. Make sure that you understand how much to take and what form to take. Work with your health care provider to find out whether it  is safe and beneficial for you to take aspirin daily. °Ask your health care provider if you need to take a cholesterol-lowering medicine (statin). °Find healthy ways to manage stress, such as: °Meditation, yoga, or listening to music. °Journaling. °Talking to a trusted person. °Spending time with friends and family. °Minimize exposure to UV radiation to reduce your risk of skin cancer. °Safety °Always wear your seat belt while driving or riding in a vehicle. °Do not drive: °If you have been drinking alcohol. Do not ride with someone who has been drinking. °When you are tired or distracted. °While texting. °If you have been using any mind-altering substances or drugs. °Wear a helmet and other protective equipment during sports activities. °If you have firearms in your house, make sure you follow all gun safety procedures. °What's next? °Visit your health care provider once a year for an annual wellness visit. °Ask your health care provider how often you should have your eyes and teeth checked. °Stay up to date on all vaccines. °This information is not intended to replace advice given to you by your health care provider. Make sure you discuss any questions you have with your health care provider. °Document Revised: 02/07/2021 Document Reviewed: 02/07/2021 °Elsevier Patient Education © 2022 Elsevier Inc. ° °

## 2022-03-06 ENCOUNTER — Other Ambulatory Visit: Payer: Self-pay | Admitting: Physician Assistant

## 2022-03-06 DIAGNOSIS — E7800 Pure hypercholesterolemia, unspecified: Secondary | ICD-10-CM

## 2022-05-31 ENCOUNTER — Other Ambulatory Visit: Payer: Self-pay

## 2022-05-31 DIAGNOSIS — E7800 Pure hypercholesterolemia, unspecified: Secondary | ICD-10-CM

## 2022-05-31 MED ORDER — ROSUVASTATIN CALCIUM 10 MG PO TABS: ORAL_TABLET | ORAL | 0 refills | Status: DC

## 2022-06-19 ENCOUNTER — Encounter: Payer: Self-pay | Admitting: Oncology

## 2022-06-25 ENCOUNTER — Encounter (HOSPITAL_COMMUNITY): Payer: Self-pay

## 2022-06-25 ENCOUNTER — Inpatient Hospital Stay: Payer: Medicare PPO | Attending: Oncology

## 2022-06-25 ENCOUNTER — Ambulatory Visit (HOSPITAL_COMMUNITY)
Admission: RE | Admit: 2022-06-25 | Discharge: 2022-06-25 | Disposition: A | Discharge status: Home / Self Care | Payer: Medicare PPO | Source: Ambulatory Visit | Attending: Oncology | Admitting: Oncology

## 2022-06-25 ENCOUNTER — Other Ambulatory Visit: Payer: Self-pay

## 2022-06-25 DIAGNOSIS — I700 Atherosclerosis of aorta: Secondary | ICD-10-CM | POA: Diagnosis not present

## 2022-06-25 DIAGNOSIS — K5730 Diverticulosis of large intestine without perforation or abscess without bleeding: Secondary | ICD-10-CM | POA: Diagnosis not present

## 2022-06-25 DIAGNOSIS — C649 Malignant neoplasm of unspecified kidney, except renal pelvis: Secondary | ICD-10-CM

## 2022-06-25 DIAGNOSIS — C689 Malignant neoplasm of urinary organ, unspecified: Secondary | ICD-10-CM | POA: Diagnosis not present

## 2022-06-25 DIAGNOSIS — Z9081 Acquired absence of spleen: Secondary | ICD-10-CM | POA: Insufficient documentation

## 2022-06-25 DIAGNOSIS — R918 Other nonspecific abnormal finding of lung field: Secondary | ICD-10-CM | POA: Diagnosis not present

## 2022-06-25 DIAGNOSIS — Z905 Acquired absence of kidney: Secondary | ICD-10-CM | POA: Insufficient documentation

## 2022-06-25 DIAGNOSIS — Z85528 Personal history of other malignant neoplasm of kidney: Secondary | ICD-10-CM | POA: Diagnosis not present

## 2022-06-25 DIAGNOSIS — R911 Solitary pulmonary nodule: Secondary | ICD-10-CM | POA: Insufficient documentation

## 2022-06-25 LAB — CBC WITH DIFFERENTIAL (CANCER CENTER ONLY)
Abs Immature Granulocytes: 0.01 10*3/uL (ref 0.00–0.07)
Basophils Absolute: 0.1 10*3/uL (ref 0.0–0.1)
Basophils Relative: 1 %
Eosinophils Absolute: 0.2 10*3/uL (ref 0.0–0.5)
Eosinophils Relative: 2 %
HCT: 40.4 % (ref 36.0–46.0)
Hemoglobin: 13.5 g/dL (ref 12.0–15.0)
Immature Granulocytes: 0 %
Lymphocytes Relative: 35 %
Lymphs Abs: 3 10*3/uL (ref 0.7–4.0)
MCH: 33.2 pg (ref 26.0–34.0)
MCHC: 33.4 g/dL (ref 30.0–36.0)
MCV: 99.3 fL (ref 80.0–100.0)
Monocytes Absolute: 0.7 10*3/uL (ref 0.1–1.0)
Monocytes Relative: 9 %
Neutro Abs: 4.5 10*3/uL (ref 1.7–7.7)
Neutrophils Relative %: 53 %
Platelet Count: 292 10*3/uL (ref 150–400)
RBC: 4.07 MIL/uL (ref 3.87–5.11)
RDW: 13.2 % (ref 11.5–15.5)
WBC Count: 8.4 10*3/uL (ref 4.0–10.5)
nRBC: 0 % (ref 0.0–0.2)

## 2022-06-25 LAB — CMP (CANCER CENTER ONLY)
ALT: 21 U/L (ref 0–44)
AST: 22 U/L (ref 15–41)
Albumin: 4.4 g/dL (ref 3.5–5.0)
Alkaline Phosphatase: 52 U/L (ref 38–126)
Anion gap: 8 (ref 5–15)
BUN: 25 mg/dL — ABNORMAL HIGH (ref 8–23)
CO2: 24 mmol/L (ref 22–32)
Calcium: 10.7 mg/dL — ABNORMAL HIGH (ref 8.9–10.3)
Chloride: 106 mmol/L (ref 98–111)
Creatinine: 0.99 mg/dL (ref 0.44–1.00)
GFR, Estimated: >60 mL/min (ref >60)
Glucose, Bld: 97 mg/dL (ref 70–99)
Potassium: 4.4 mmol/L (ref 3.5–5.1)
Sodium: 138 mmol/L (ref 135–145)
Total Bilirubin: 0.7 mg/dL (ref 0.3–1.2)
Total Protein: 7.7 g/dL (ref 6.5–8.1)

## 2022-06-25 MED ORDER — IOHEXOL 300 MG/ML  SOLN
100.0000 mL | Freq: Once | INTRAMUSCULAR | Status: AC | PRN
  Administered 2022-06-25: 100 mL via INTRAVENOUS

## 2022-06-27 ENCOUNTER — Other Ambulatory Visit: Payer: Self-pay | Admitting: Oncology

## 2022-06-27 DIAGNOSIS — C649 Malignant neoplasm of unspecified kidney, except renal pelvis: Secondary | ICD-10-CM

## 2022-06-27 NOTE — Progress Notes (Signed)
The results of her CT scan was personally reviewed and discussed with the patient.  She did have a small pulmonary nodules that are not necessarily concerning for metastatic disease but certainly require monitoring.  I recommended repeat imaging studies in the next 4 to 6 months to ensure stability or resolution.  I do not believe or hepatic nodules are of concern given the chronicity of these findings.  The plan is to arrange for repeat CT scan in March and a follow-up visit after that.  All her questions were answered to her satisfaction.

## 2022-07-02 ENCOUNTER — Ambulatory Visit: Payer: Medicare PPO | Admitting: Oncology

## 2022-07-07 ENCOUNTER — Encounter: Payer: Self-pay | Admitting: Physician Assistant

## 2022-08-01 ENCOUNTER — Encounter: Payer: Self-pay | Admitting: Oncology

## 2022-08-28 ENCOUNTER — Telehealth: Payer: Self-pay | Admitting: *Deleted

## 2022-08-28 ENCOUNTER — Other Ambulatory Visit: Payer: Self-pay

## 2022-08-28 DIAGNOSIS — E7800 Pure hypercholesterolemia, unspecified: Secondary | ICD-10-CM

## 2022-08-28 MED ORDER — ROSUVASTATIN CALCIUM 10 MG PO TABS: ORAL_TABLET | ORAL | 0 refills | Status: DC

## 2022-08-28 NOTE — Telephone Encounter (Signed)
Rx was sent to [facility]

## 2022-08-28 NOTE — Telephone Encounter (Signed)
Returned patients call she left on VM.  She is requesting refills on below.      rosuvastatin (CRESTOR) 10 MG tablet    [facility]   LOV:10/15/21 [facility] ROV: AWV due 09/2022

## 2022-08-28 NOTE — Telephone Encounter (Signed)
Pt has enough meds for today and Sunday. Jenny Soto, CMA

## 2022-09-04 ENCOUNTER — Encounter: Payer: Self-pay | Admitting: Oncology

## 2022-09-11 ENCOUNTER — Telehealth: Payer: Self-pay | Admitting: Oncology

## 2022-09-11 NOTE — Telephone Encounter (Signed)
Called patient per 1/17 secure chat with [place] scheduler. Patient had reached out to them to be put on Dr. Gearldine Shown schedule instead. Forwarding information to schedulers.

## 2022-09-20 ENCOUNTER — Other Ambulatory Visit: Payer: Self-pay | Admitting: Nurse Practitioner

## 2022-09-20 DIAGNOSIS — Z0000 Encounter for general adult medical examination without abnormal findings: Secondary | ICD-10-CM

## 2022-09-20 DIAGNOSIS — R7303 Prediabetes: Secondary | ICD-10-CM

## 2022-09-20 DIAGNOSIS — Z79899 Other long term (current) drug therapy: Secondary | ICD-10-CM

## 2022-09-20 DIAGNOSIS — E7800 Pure hypercholesterolemia, unspecified: Secondary | ICD-10-CM

## 2022-10-14 ENCOUNTER — Inpatient Hospital Stay: Payer: Medicare PPO | Attending: Oncology | Admitting: Oncology

## 2022-10-14 ENCOUNTER — Ambulatory Visit: Payer: Medicare PPO | Admitting: Oncology

## 2022-10-14 VITALS — BP 141/80 | HR 73 | Temp 98.2°F | Resp 18 | Ht 65.0 in | Wt 143.6 lb

## 2022-10-14 DIAGNOSIS — C649 Malignant neoplasm of unspecified kidney, except renal pelvis: Secondary | ICD-10-CM | POA: Diagnosis not present

## 2022-10-14 DIAGNOSIS — Z9081 Acquired absence of spleen: Secondary | ICD-10-CM | POA: Insufficient documentation

## 2022-10-14 DIAGNOSIS — Z87891 Personal history of nicotine dependence: Secondary | ICD-10-CM | POA: Diagnosis not present

## 2022-10-14 DIAGNOSIS — Z905 Acquired absence of kidney: Secondary | ICD-10-CM | POA: Diagnosis not present

## 2022-10-14 DIAGNOSIS — Z85528 Personal history of other malignant neoplasm of kidney: Secondary | ICD-10-CM | POA: Diagnosis present

## 2022-10-14 DIAGNOSIS — E7800 Pure hypercholesterolemia, unspecified: Secondary | ICD-10-CM | POA: Diagnosis not present

## 2022-10-14 NOTE — Progress Notes (Signed)
[place] OFFICE PROGRESS NOTE   Diagnosis: Renal cell carcinoma  INTERVAL HISTORY:   Jenny Soto has been followed by Dr. Alen Blew after being diagnosed with renal cell carcinoma in 2006.  She underwent a left nephrectomy in 2006.  She developed a recurrence in the right lower lung in 2009 and underwent a wedge resection.  The pathology confirmed renal cell carcinoma.  She underwent cryoablation of a right renal mass in February 2019, she reports this was felt to be a metastasis.  She currently feels well.  She underwent restaging CTs 06/25/2022.  A new 5 mm right upper lobe perifissural nodule and a 4 mm left upper lobe nodule are nonspecific.  2 tiny arterially enhancing lesions in the peripheral right liver.  1 was noted on a CT on 07/28/2019.  Dr. Alen Blew recommends repeat imaging at a 4-18-monthinterval.  Ms. CFasowould like to have the CTs within the next few weeks [place]  Past medical history: "Prediabetes " Hypercholesterolemia G3 P2, 1 ectopic Past surgical history: Ectopic pregnancy surgery Left nephrectomy Cholecystectomy Right lower lobe wedge resection 2009 Vocal cord polyp surgery Nasal septum surgery  Family history: She is adopted  Social history: [place]  She is retired eAutomotive engineerand rEnvironmental manager  She quit smoking cigarettes in 2006.  She has 1 cocktail per day.  No transfusion history.  No risk factor for HIV or hepatitis.   Review of systems: Positives-occasional right subcostal "ache ".  A complete review of systems is otherwise negative  Objective:  Vital signs in last 24 hours:  Blood pressure (!) 141/80, pulse 73, temperature 98.2 F (36.8 C), temperature source Oral, resp. rate 18, height 5' 5"$  (1.651 m), weight 143 lb 9.6 oz (65.1 kg), SpO2 99 %.    Lymphatics: No cervical, supraclavicular, axillary, or inguinal nodes Resp: Decreased breath  sounds at the right posterior chest, no respiratory distress Cardio: Regular rate and rhythm GI: No hepatosplenomegaly, no mass, nontender Vascular: No leg edema Neuro: Alert and oriented, the motor exam appears intact in the upper and lower extremities bilaterally Skin: No evidence of recurrent tumor at the right chest or left abdomen scars  Lab Results:  Lab Results  Component Value Date   WBC 8.4 06/25/2022   HGB 13.5 06/25/2022   HCT 40.4 06/25/2022   MCV 99.3 06/25/2022   PLT 292 06/25/2022   NEUTROABS 4.5 06/25/2022    CMP  Lab Results  Component Value Date   NA 138 06/25/2022   K 4.4 06/25/2022   CL 106 06/25/2022   CO2 24 06/25/2022   GLUCOSE 97 06/25/2022   BUN 25 (H) 06/25/2022   CREATININE 0.99 06/25/2022   CALCIUM 10.7 (H) 06/25/2022   PROT 7.7 06/25/2022   ALBUMIN 4.4 06/25/2022   AST 22 06/25/2022   ALT 21 06/25/2022   ALKPHOS 52 06/25/2022   BILITOT 0.7 06/25/2022   GFRNONAA >60 06/25/2022   GFRAA 71 10/06/2020      Imaging:  CT images from 06/25/2022 viewed with Jenny Soto Medications: I have reviewed the patient's current medications.   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, status post a left nephrectomy, 12.3 cm Fuh[place]an grade 3 with negative surgical margins and 1 negative lymph node Right lower lobe wedge resection 08/12/2008-metastatic renal cell carcinoma, negative parenchymal margin, negative level 11 R lymph nodes 09/26/2017-CT-guided cryoablation of right renal upper pole lesion FNA  and core biopsy of right retroperitoneal lymph node 02/12/2018-highly suspicious, but not diagnostic of metastatic renal cell carcinoma CT 08/10/2018-no suspicious abdominopelvic lymphadenopathy, right retrocaval lymph node no longer visualized CTs 06/25/2022 -5 mm right upper lobe and 4 mm left upper lobe nonspecific nodules, tiny arterial enhancing foci in the peripheral light liver measure up to 6 mm-1 lesion was present on  07/28/2019, no pathologically enlarged abdominal or pelvic nodes  2.  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  Hypercholesterolemia 4.  Chronic mild elevation of the serum calcium    Disposition: Jenny Soto has a history of renal cell carcinoma dating to 2006.  She has been treated for metastatic disease involving the right lower lung and right kidney.  I reviewed the CT findings and images from 06/25/2022 with her.  The tiny lung nodules and liver lesions noted on CT are likely benign findings.  She will be scheduled for repeat imaging in approximately 2 weeks.  We will contact her with the CT results.  There is no clinical evidence of disease progression.  She has chronic mild elevation of the calcium level.  I cannot relate this to her medical regimen.  We will check a PTH level when she is here for the previous CT labs.  [place] staying until November of this year.  She will return for an office visit in November.  Jenny Coder, MD  10/14/2022  11:09 AM

## 2022-10-18 ENCOUNTER — Encounter: Payer: Self-pay | Admitting: *Deleted

## 2022-10-23 ENCOUNTER — Other Ambulatory Visit: Payer: Self-pay

## 2022-10-23 DIAGNOSIS — E7800 Pure hypercholesterolemia, unspecified: Secondary | ICD-10-CM

## 2022-10-23 DIAGNOSIS — Z79899 Other long term (current) drug therapy: Secondary | ICD-10-CM

## 2022-10-23 DIAGNOSIS — R7303 Prediabetes: Secondary | ICD-10-CM

## 2022-10-23 DIAGNOSIS — Z0000 Encounter for general adult medical examination without abnormal findings: Secondary | ICD-10-CM

## 2022-10-24 ENCOUNTER — Other Ambulatory Visit: Payer: Medicare PPO

## 2022-10-24 DIAGNOSIS — Z79899 Other long term (current) drug therapy: Secondary | ICD-10-CM

## 2022-10-24 DIAGNOSIS — Z0000 Encounter for general adult medical examination without abnormal findings: Secondary | ICD-10-CM

## 2022-10-24 DIAGNOSIS — E7800 Pure hypercholesterolemia, unspecified: Secondary | ICD-10-CM

## 2022-10-24 DIAGNOSIS — R7303 Prediabetes: Secondary | ICD-10-CM

## 2022-10-25 LAB — HEMOGLOBIN A1C
Est. average glucose Bld gHb Est-mCnc: 120 mg/dL
Hgb A1c MFr Bld: 5.8 % — ABNORMAL HIGH (ref 4.8–5.6)

## 2022-10-25 LAB — LIPID PANEL
Chol/HDL Ratio: 3 ratio (ref 0.0–4.4)
Cholesterol, Total: 198 mg/dL (ref 100–199)
HDL: 66 mg/dL (ref >39)
LDL Chol Calc (NIH): 108 mg/dL — ABNORMAL HIGH (ref 0–99)
Triglycerides: 141 mg/dL (ref 0–149)
VLDL Cholesterol Cal: 24 mg/dL (ref 5–40)

## 2022-10-25 LAB — CBC
Hematocrit: 43.7 % (ref 34.0–46.6)
Hemoglobin: 14.6 g/dL (ref 11.1–15.9)
MCH: 32.7 pg (ref 26.6–33.0)
MCHC: 33.4 g/dL (ref 31.5–35.7)
MCV: 98 fL — ABNORMAL HIGH (ref 79–97)
Platelets: 328 10*3/uL (ref 150–450)
RBC: 4.47 x10E6/uL (ref 3.77–5.28)
RDW: 12.7 % (ref 11.7–15.4)
WBC: 5 10*3/uL (ref 3.4–10.8)

## 2022-10-25 LAB — COMPREHENSIVE METABOLIC PANEL
ALT: 21 IU/L (ref 0–32)
AST: 22 IU/L (ref 0–40)
Albumin/Globulin Ratio: 1.6 (ref 1.2–2.2)
Albumin: 4.4 g/dL (ref 3.9–4.9)
Alkaline Phosphatase: 66 IU/L (ref 44–121)
BUN/Creatinine Ratio: 15 (ref 12–28)
BUN: 15 mg/dL (ref 8–27)
Bilirubin Total: 0.5 mg/dL (ref 0.0–1.2)
CO2: 23 mmol/L (ref 20–29)
Calcium: 10.6 mg/dL — ABNORMAL HIGH (ref 8.7–10.3)
Chloride: 106 mmol/L (ref 96–106)
Creatinine, Ser: 0.98 mg/dL (ref 0.57–1.00)
Globulin, Total: 2.7 g/dL (ref 1.5–4.5)
Glucose: 98 mg/dL (ref 70–99)
Potassium: 4.9 mmol/L (ref 3.5–5.2)
Sodium: 142 mmol/L (ref 134–144)
Total Protein: 7.1 g/dL (ref 6.0–8.5)
eGFR: 62 mL/min/{1.73_m2} (ref >59)

## 2022-10-25 LAB — TSH: TSH: 1.31 u[IU]/mL (ref 0.450–4.500)

## 2022-10-28 ENCOUNTER — Inpatient Hospital Stay: Payer: Medicare PPO | Attending: Oncology

## 2022-10-28 ENCOUNTER — Ambulatory Visit (HOSPITAL_BASED_OUTPATIENT_CLINIC_OR_DEPARTMENT_OTHER)
Admission: RE | Admit: 2022-10-28 | Discharge: 2022-10-28 | Disposition: A | Discharge status: Home / Self Care | Payer: Medicare PPO | Source: Ambulatory Visit | Attending: Oncology | Admitting: Oncology

## 2022-10-28 DIAGNOSIS — K5730 Diverticulosis of large intestine without perforation or abscess without bleeding: Secondary | ICD-10-CM | POA: Insufficient documentation

## 2022-10-28 DIAGNOSIS — R918 Other nonspecific abnormal finding of lung field: Secondary | ICD-10-CM | POA: Diagnosis not present

## 2022-10-28 DIAGNOSIS — Z85528 Personal history of other malignant neoplasm of kidney: Secondary | ICD-10-CM | POA: Insufficient documentation

## 2022-10-28 DIAGNOSIS — C649 Malignant neoplasm of unspecified kidney, except renal pelvis: Secondary | ICD-10-CM | POA: Insufficient documentation

## 2022-10-28 DIAGNOSIS — I700 Atherosclerosis of aorta: Secondary | ICD-10-CM | POA: Insufficient documentation

## 2022-10-28 DIAGNOSIS — Z905 Acquired absence of kidney: Secondary | ICD-10-CM | POA: Insufficient documentation

## 2022-10-28 LAB — CMP (CANCER CENTER ONLY)
ALT: 19 U/L (ref 0–44)
AST: 20 U/L (ref 15–41)
Albumin: 4.6 g/dL (ref 3.5–5.0)
Alkaline Phosphatase: 47 U/L (ref 38–126)
Anion gap: 7 (ref 5–15)
BUN: 16 mg/dL (ref 8–23)
CO2: 28 mmol/L (ref 22–32)
Calcium: 11 mg/dL — ABNORMAL HIGH (ref 8.9–10.3)
Chloride: 103 mmol/L (ref 98–111)
Creatinine: 0.94 mg/dL (ref 0.44–1.00)
GFR, Estimated: >60 mL/min (ref >60)
Glucose, Bld: 104 mg/dL — ABNORMAL HIGH (ref 70–99)
Potassium: 4.9 mmol/L (ref 3.5–5.1)
Sodium: 138 mmol/L (ref 135–145)
Total Bilirubin: 0.7 mg/dL (ref 0.3–1.2)
Total Protein: 7.8 g/dL (ref 6.5–8.1)

## 2022-10-28 MED ORDER — IOHEXOL 350 MG/ML SOLN
100.0000 mL | Freq: Once | INTRAVENOUS | Status: AC | PRN
  Administered 2022-10-28: 75 mL via INTRAVENOUS

## 2022-10-29 LAB — PTH, INTACT AND CALCIUM
Calcium, Total (PTH): 10.8 mg/dL — ABNORMAL HIGH (ref 8.7–10.3)
PTH: 32 pg/mL (ref 15–65)

## 2022-10-30 ENCOUNTER — Ambulatory Visit (INDEPENDENT_AMBULATORY_CARE_PROVIDER_SITE_OTHER): Payer: Medicare PPO | Admitting: Family Medicine

## 2022-10-30 ENCOUNTER — Telehealth: Payer: Self-pay

## 2022-10-30 ENCOUNTER — Encounter: Payer: Self-pay | Admitting: Family Medicine

## 2022-10-30 VITALS — BP 125/84 | HR 67 | Resp 18 | Ht 65.0 in | Wt 142.0 lb

## 2022-10-30 DIAGNOSIS — R7303 Prediabetes: Secondary | ICD-10-CM | POA: Diagnosis not present

## 2022-10-30 DIAGNOSIS — Z0000 Encounter for general adult medical examination without abnormal findings: Secondary | ICD-10-CM

## 2022-10-30 DIAGNOSIS — E7800 Pure hypercholesterolemia, unspecified: Secondary | ICD-10-CM

## 2022-10-30 DIAGNOSIS — E785 Hyperlipidemia, unspecified: Secondary | ICD-10-CM

## 2022-10-30 NOTE — Patient Instructions (Addendum)
Continue with your current medication regimen for cholesterol.  Discontinue your vitamin D supplement as we discussed for the time being.  We will see what the lab results show [place] regarding the cause of your high calcium levels.  I have included some information on the pneumonia vaccine as well as the RSV vaccine if you were curious to learn about either of them.  It was nice to meet you today, and we will get to the bottom of what is going on!

## 2022-10-30 NOTE — Telephone Encounter (Signed)
Patient gave verbal understanding and had no further questions or concerns  

## 2022-10-30 NOTE — Assessment & Plan Note (Signed)
Mildly elevated for the past 4 years, most recent calcium level 11.0, total calcium 10.8, PTH 35 (within normal limits).  CT performed by oncology negative for malignancy so doubt cancer related etiology.  Will continue workup with PTH related peptide and vitamin D levels.  Patient has been taking a vitamin D supplement, she does not know what strength.  She believes that she did start taking it around the time that her calcium started to increase on labs.  She endorses some occasional mild constipation but otherwise denies nephrolithiasis, bone pain, psychiatric symptoms, or problems with her blood pressure.  She is only taking rosuvastatin 10 mg twice a week for her cholesterol.

## 2022-10-30 NOTE — Progress Notes (Signed)
Subjective:   Jenny Soto is a 72 y.o. female who presents for Medicare Annual (Subsequent) preventive examination. 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  She is followed by oncology and had labs done recently revealing hypercalcemia.  Oncology ordered PTH which came back as normal as well as a CT scan which was negative for any abnormalities including malignancy.  Oncology instructed her to follow-up with primary care regarding the cause of hypercalcemia which has been mildly elevated for about 4 years now and has never been looked into.  Review of Systems    Review of Systems  All other systems reviewed and are negative.   Cardiac Risk Factors include: none     Objective:    Today's Vitals   10/30/22 1350  BP: 125/84  Pulse: 67  Resp: 18  SpO2: 99%  Weight: 142 lb (64.4 kg)  Height: '5\' 5"'$  (1.651 m)  PainSc: 0-No pain   Body mass index is 23.63 kg/m.     10/14/2022   10:47 AM 02/12/2018    7:17 AM 09/26/2017    5:07 PM 09/26/2017   10:29 AM 09/23/2017    1:29 PM 12/02/2016    8:23 AM 11/18/2016    2:30 PM  Advanced Directives  Does Patient Have a Medical Advance Directive? Yes No Yes Yes Yes Yes Yes  Type of Paramedic of [place];Living will  Living will Living will Living will [place];Living will [place];Living will  Does patient want to make changes to medical advance directive? No - Patient declined  No - Patient declined      Copy of [place] in Chart? No - copy requested  No - copy requested No - copy requested No - copy requested No - copy requested   Would patient like information on creating a medical advance directive?  No - Patient declined         Current Medications (verified) Outpatient Encounter Medications as of 10/30/2022  Medication Sig   rosuvastatin (CRESTOR) 10 MG tablet TAKE 1 TABLET BY MOUTH 2 TIMES A WEEK.   No  facility-administered encounter medications on file as of 10/30/2022.    Allergies (verified) Oxycodone-acetaminophen   History: Past Medical History:  Diagnosis Date   Cancer ([place]) [phone]   kidney, lung   Gallbladder sludge    History of hiatal hernia    Hyperlipidemia    PONV (postoperative nausea and vomiting)    Right renal mass    Past Surgical History:  Procedure Laterality Date   BLADDER SUSPENSION  july 2011 and nov 2011   [place] N/A    Phreesia 10/06/2020   INCISIONAL HERNIA REPAIR N/A 10/04/2015   Procedure: LAPAROSCOPIC INCISIONAL HERNIA;  Surgeon: Ralene Ok, MD;  Location: [facility];  Service: General;  Laterality: N/A;   INSERTION OF MESH N/A 10/04/2015   Procedure: WITH INSERTION OF MESH;  Surgeon: Ralene Ok, MD;  Location: [facility];  Service: General;  Laterality: N/A;   IR RADIOLOGIST EVAL & MGMT  09/10/2017   IR RADIOLOGIST EVAL & MGMT  10/28/2017   IR RADIOLOGIST EVAL & MGMT  01/27/2018   LAPAROSCOPIC CHOLECYSTECTOMY  05/29/11   LOBECTOMY  2009   right lower lung   NEPHRECTOMY  october 2006   left kidney   NOVASURE ABLATION  2005   POLYPECTOMY  1985   vocal cord   RADIOFREQUENCY ABLATION Right 09/26/2017   Procedure: CT RENAL CRYOABLATION;  Surgeon: Markus Daft, MD;  Location: [facility];  Service: Anesthesiology;  Laterality: Right;   RHINOPLASTY  1985   SPLENECTOMY, TOTAL  october 2006   TUBAL LIGATION  1983   TUMOR REMOVAL  1992   ovarian durmoid tumors bilateral   Family History  Adopted: Yes  Problem Relation Age of Onset   Heart disease Mother    Colon cancer Neg Hx    Social History   Socioeconomic History   Marital status: Married    Spouse name: Not on file   Number of children: Not on file   Years of education: Not on file   Highest education level: Not on file  Occupational History   Not on file  Tobacco Use   Smoking status: Former    Packs/day: 0.50    Years: 20.00    Total  pack years: 10.00    Types: Cigarettes    Quit date: 08/26/2004    Years since quitting: 18.1    Passive exposure: Never   Smokeless tobacco: Never  Vaping Use   Vaping Use: Never used  Substance and Sexual Activity   Alcohol use: Yes    Alcohol/week: 6.0 standard drinks of alcohol    Types: 3 Glasses of wine, 3 Cans of beer per week    Comment: wine or beer every night   Drug use: No   Sexual activity: Yes    Birth control/protection: Surgical  Other Topics Concern   Not on file  Social History Narrative   Not on file   Social Determinants of Health   Financial Resource Strain: Low Risk  (10/30/2022)   Overall Financial Resource Strain (CARDIA)    Difficulty of Paying Living Expenses: Not hard at all  Food Insecurity: No Food Insecurity (10/30/2022)   Hunger Vital Sign    Worried About Running Out of Food in the Last Year: Never true    Ran Out of Food in the Last Year: Never true  Transportation Needs: No Transportation Needs (10/30/2022)   PRAPARE - Hydrologist (Medical): No    Lack of Transportation (Non-Medical): No  Physical Activity: Insufficiently Active (10/30/2022)   Exercise Vital Sign    Days of Exercise per Week: 3 days    Minutes of Exercise per Session: 40 min  Stress: No Stress Concern Present (10/30/2022)   [place]    Feeling of Stress : Not at all  Social Connections: Moderately Integrated (10/30/2022)   Social Connection and Isolation Panel [NHANES]    Frequency of Communication with Friends and Family: More than three times a week    Frequency of Social Gatherings with Friends and Family: Three times a week    Attends Religious Services: Never    Active Member of Clubs or Organizations: Yes    Attends Music therapist: More than 4 times per year    Marital Status: Married    Tobacco Counseling Counseling given: Not Answered   Clinical Intake:      Pain Score: 0-No pain           Diabetic? No         Activities of Daily Living    10/30/2022    1:39 PM  In your present state of health, do you have any difficulty performing the following activities:  Hearing? 0  Vision?  0  Difficulty concentrating or making decisions? 0  Walking or climbing stairs? 0  Dressing or bathing? 0  Doing errands, shopping? 0  Preparing Food and eating ? N  Using the Toilet? N  In the past six months, have you accidently leaked urine? N  Do you have problems with loss of bowel control? N  Managing your Medications? N  Managing your Finances? N  Housekeeping or managing your Housekeeping? N    Patient Care Team: Velva Harman, PA as PCP - General (Family Medicine) Wyatt Portela, MD as Consulting Physician (Oncology) Janyth Contes, MD as Consulting Physician (Obstetrics and Gynecology) Devra Dopp, MD as Referring Physician (Dermatology) Rutherford Guys, MD as Consulting Physician (Ophthalmology) Wyatt Portela, MD as Consulting Physician (Oncology)  Indicate any recent Medical Services you may have received from other than [facility] providers in the past year (date may be approximate).     Assessment:   This is a routine wellness examination for Lilybeth.  Hearing/Vision screen Vision Screening - Comments:: Followed by Dr. Clementeen Graham in [place], [place]  Dietary issues and exercise activities discussed: Current Exercise Habits: Home exercise routine, Type of exercise: walking, Time (Minutes): 40, Frequency (Times/Week): 3, Weekly Exercise (Minutes/Week): 120, Intensity: Moderate, Exercise limited by: Other - see comments (none)   Goals Addressed   None   Depression Screen    10/30/2022    1:48 PM 10/30/2022    1:36 PM 10/14/2022   10:58 AM 10/15/2021    2:40 PM 10/09/2020   11:07 AM 03/13/2020    2:33 PM 08/06/2018    3:40 PM  PHQ 2/9 Scores  PHQ - 2 Score 0 0 0 0 0 0 0  PHQ- 9 Score    0 0 0 0    Fall  Risk    10/15/2021    2:40 PM 10/09/2020   11:07 AM 03/13/2020    2:33 PM 08/06/2018    3:44 PM 10/02/2016    8:43 AM  Fall Risk   Falls in the past year? 0 0 0 1 No  Number falls in past yr: 0   0   Injury with Fall? 0      Risk for fall due to : No Fall Risks      Follow up Falls evaluation completed Falls evaluation completed Falls evaluation completed      [place]:  Any stairs in or around the home? No  If so, are there any without handrails? No  Home free of loose throw rugs in walkways, pet beds, electrical cords, etc? Yes  Adequate lighting in your home to reduce risk of falls? Yes   ASSISTIVE DEVICES UTILIZED TO PREVENT FALLS:  Life alert? No  Use of a cane, walker or w/c? No  Grab bars in the bathroom? No  Shower chair or bench in shower? No  Elevated toilet seat or a handicapped toilet? No   TIMED UP AND GO:  Was the test performed? Yes .  Length of time to ambulate 10 feet: 10-15 sec.   Gait steady and fast without use of assistive device  Cognitive Function:        10/30/2022    1:42 PM 10/15/2021    2:32 PM 10/09/2020   11:08 AM  6CIT Screen  What Year? 0 points 0 points 0 points  What month? 0 points 0 points 0 points  What time? 0 points 0 points 0 points  Count back from 20  0 points 0 points 0 points  Months in reverse 2 points 0 points 0 points  Repeat phrase 0 points 0 points 0 points  Total Score 2 points 0 points 0 points    Immunizations Immunization History  Administered Date(s) Administered   19-influenza Whole 06/09/2019   Influenza, High Dose Seasonal PF 06/09/2019, 06/28/2021   Influenza, Seasonal, Injecte, Preservative Fre 06/22/2014   Influenza,inj,quad, With Preservative 06/11/2018   Influenza-Unspecified 07/03/2015, 07/05/2016, 06/09/2018, 05/29/2020, 07/02/2022   PFIZER(Purple Top)SARS-COV-2 Vaccination 09/30/2019, 10/25/2019, 06/13/2020   Pneumococcal Conjugate-13 07/03/2015   Pneumococcal  Polysaccharide-23 08/12/2018   Tdap 10/02/2016   Zoster Recombinat (Shingrix) 08/12/2018, 11/04/2018   Zoster, Live 07/03/2015    TDAP status: Up to date  Flu Vaccine status: Up to date  Pneumococcal vaccine status: Up to date  Covid-19 vaccine status: Information provided on how to obtain vaccines.   Qualifies for Shingles Vaccine? Yes   Zostavax completed Yes   Shingrix Completed?: Yes  Screening Tests Health Maintenance  Topic Date Due   COVID-19 Vaccine (4 - 2023-24 season) 04/26/2022   MAMMOGRAM  10/11/2022   Medicare Annual Wellness (AWV)  10/30/2023   DTaP/Tdap/Td (2 - Td or Tdap) 10/02/2026   COLONOSCOPY (Pts 45-37yr Insurance coverage will need to be confirmed)  12/03/2026   Pneumonia Vaccine 70 Years old  Completed   INFLUENZA VACCINE  Completed   DEXA SCAN  Completed   Hepatitis C Screening  Completed   Zoster Vaccines- Shingrix  Completed   HPV VACCINES  Aged Out    Health Maintenance  Health Maintenance Due  Topic Date Due   COVID-19 Vaccine (4 - 2023-24 season) 04/26/2022   MAMMOGRAM  10/11/2022    Colorectal cancer screening: Type of screening: Colonoscopy. Completed 12/02/2016. Repeat every 10 years  Mammogram status: Completed 10/11/2020. Repeat every year  Bone Density status: Completed 10/21/2020. Results reflect: Bone density results: OSTEOPOROSIS. Repeat every 2 years.  Lung Cancer Screening: (Low Dose CT Chest recommended if Age 453-80years, 30 pack-year currently smoking OR have quit w/in 15years.) does not qualify.     Additional Screening:  Hepatitis C Screening: does qualify; declined  Vision Screening: Recommended annual ophthalmology exams for early detection of glaucoma and other disorders of the eye. Is the patient up to date with their annual eye exam?  Yes  Who is the provider or what is the name of the office in which the patient attends annual eye exams? Dr. WClementeen GrahamIf pt is not established with a provider, would they like  to be referred to a provider to establish care?  Established .   Dental Screening: Recommended annual dental exams for proper oral hygiene  Community Resource Referral / Chronic Care Management: CRR required this visit?  No   CCM required this visit?  No      Plan:    Encounter for Medicare annual wellness exam  Hypercalcemia Assessment & Plan: Mildly elevated for the past 4 years, most recent calcium level 11.0, total calcium 10.8, PTH 35 (within normal limits).  CT performed by oncology negative for malignancy so doubt cancer related etiology.  Will continue workup with PTH related peptide and vitamin D levels.  Patient has been taking a vitamin D supplement, she does not know what strength.  She believes that she did start taking it around the time that her calcium started to increase on labs.  She endorses some occasional mild constipation but otherwise denies nephrolithiasis, bone pain, psychiatric symptoms, or problems with her blood pressure.  She is only taking rosuvastatin 10 mg twice a week for her cholesterol.  Orders: -     PTH-related peptide -     Vitamin D 1,25 dihydroxy; Future -     VITAMIN D 25 Hydroxy (Vit-D Deficiency, Fractures)    I have personally reviewed and noted the following in the patient's chart:   Medical and social history Use of alcohol, tobacco or illicit drugs  Current medications and supplements including opioid prescriptions. Patient is not currently taking opioid prescriptions. Functional ability and status Nutritional status Physical activity Advanced directives List of other physicians Hospitalizations, surgeries, and ER visits in previous 12 months Vitals Screenings to include cognitive, depression, and falls Referrals and appointments  In addition, I have reviewed and discussed with patient certain preventive protocols, quality metrics, and best practice recommendations. A written personalized care plan for preventive services as well  as general preventive health recommendations were provided to patient.    Return in 3 months (on 01/30/2023) for follow-up on high calcium, virtual visit.   Velva Harman, PA   10/30/2022   Nurse Notes: Face to face 20 min

## 2022-10-30 NOTE — Telephone Encounter (Signed)
-----   Message from Ladell Pier, MD sent at 10/29/2022  8:03 PM EST ----- Please call patient, CTs show no evidence of recurrent cancer, she should f/u with primary provider to evaluate the chronic hypercalcemia

## 2022-11-05 ENCOUNTER — Other Ambulatory Visit: Payer: Self-pay | Admitting: Family Medicine

## 2022-11-05 LAB — VITAMIN D 1,25 DIHYDROXY
Vitamin D 1, 25 (OH)2 Total: 45 pg/mL
Vitamin D2 1, 25 (OH)2: <10 pg/mL
Vitamin D3 1, 25 (OH)2: 45 pg/mL

## 2022-11-05 LAB — VITAMIN D 25 HYDROXY (VIT D DEFICIENCY, FRACTURES): Vit D, 25-Hydroxy: 51.8 ng/mL (ref 30.0–100.0)

## 2022-11-06 ENCOUNTER — Other Ambulatory Visit (INDEPENDENT_AMBULATORY_CARE_PROVIDER_SITE_OTHER): Payer: Medicare PPO

## 2022-11-08 LAB — PROTEIN ELECTROPHORESIS, URINE REFLEX

## 2022-11-09 LAB — PE AND FLC, SERUM
A/G Ratio: 1.1 (ref 0.7–1.7)
Albumin ELP: 3.7 g/dL (ref 2.9–4.4)
Alpha 1: 0.2 g/dL (ref 0.0–0.4)
Alpha 2: 0.8 g/dL (ref 0.4–1.0)
Beta: 1.1 g/dL (ref 0.7–1.3)
Gamma Globulin: 1.2 g/dL (ref 0.4–1.8)
Globulin, Total: 3.4 g/dL (ref 2.2–3.9)
Ig Kappa Free Light Chain: 23.6 mg/L — ABNORMAL HIGH (ref 3.3–19.4)
Ig Lambda Free Light Chain: 18.7 mg/L (ref 5.7–26.3)
KAPPA/LAMBDA RATIO: 1.26 (ref 0.26–1.65)
Total Protein: 7.1 g/dL (ref 6.0–8.5)

## 2022-11-09 LAB — PROTEIN ELECTROPHORESIS, URINE REFLEX
Albumin ELP, Urine: 100 %
Alpha-1-Globulin, U: 0 %
Alpha-2-Globulin, U: 0 %
Beta Globulin, U: 0 %
Gamma Globulin, U: 0 %
Protein, Ur: 4.3 mg/dL

## 2022-11-09 LAB — PROTEIN ELECTROPHORESIS, SERUM

## 2022-11-11 ENCOUNTER — Encounter: Payer: Self-pay | Admitting: Oncology

## 2022-11-12 LAB — PTH-RELATED PEPTIDE: PTH-related peptide: <2 pmol/L

## 2022-11-12 NOTE — Progress Notes (Signed)
Called patient to discuss her oncologist's reply:  Ladell Pier, MD  Velva Harman, PA The mild elevation of the kappa light chains is likely a benign finding, no other evidence of myeloma, let me know if any of the other myeloma labs you sent return positive I am on call multiple days over the next few weeks and will not be able to get her in before the 27th, she can follow-up with an oncologist or primary provider at the [facility], I will be glad to discuss her case with you or other providers  Patient stated that oncology nurse actually did get in touch with her and let her know that she would be sending a note in the upcoming days and they can just get some follow-up lab work while she and her husband are at [employer].  It sounded like oncology was going to take care of of ordering the labs, but patient will let me know if she does not hear from oncology to make sure that we coordinate who is ordering labs in the next few months.  Other than getting those follow-up labs, patient is going to wait on seeing oncology or primary care at the [facility].

## 2022-11-13 ENCOUNTER — Other Ambulatory Visit: Payer: Medicare PPO

## 2022-11-20 ENCOUNTER — Ambulatory Visit: Payer: Medicare PPO | Admitting: Internal Medicine

## 2022-11-20 ENCOUNTER — Other Ambulatory Visit: Payer: Medicare PPO

## 2022-11-20 ENCOUNTER — Ambulatory Visit: Payer: Medicare PPO | Admitting: Oncology

## 2022-11-22 ENCOUNTER — Other Ambulatory Visit: Payer: Self-pay | Admitting: Nurse Practitioner

## 2022-11-22 DIAGNOSIS — E7800 Pure hypercholesterolemia, unspecified: Secondary | ICD-10-CM

## 2023-01-14 ENCOUNTER — Encounter: Payer: Self-pay | Admitting: Oncology

## 2023-01-24 ENCOUNTER — Encounter: Payer: Self-pay | Admitting: Oncology

## 2023-01-30 ENCOUNTER — Ambulatory Visit: Payer: Medicare PPO | Admitting: Family Medicine

## 2023-06-21 ENCOUNTER — Encounter: Payer: Self-pay | Admitting: Oncology

## 2023-06-23 ENCOUNTER — Other Ambulatory Visit: Payer: Self-pay

## 2023-06-23 DIAGNOSIS — C649 Malignant neoplasm of unspecified kidney, except renal pelvis: Secondary | ICD-10-CM

## 2023-07-04 ENCOUNTER — Encounter: Payer: Self-pay | Admitting: Oncology

## 2023-07-07 ENCOUNTER — Telehealth: Payer: Self-pay | Admitting: Oncology

## 2023-07-07 NOTE — Telephone Encounter (Signed)
Spoke with patient confirming upcoming appointment change

## 2023-07-09 ENCOUNTER — Inpatient Hospital Stay: Payer: Medicare PPO | Admitting: Oncology

## 2023-07-09 ENCOUNTER — Inpatient Hospital Stay: Payer: Medicare PPO

## 2023-07-31 NOTE — Telephone Encounter (Signed)
Medication refill request is denied. Patient needs an office visit for refills.

## 2023-07-31 NOTE — Telephone Encounter (Signed)
Copied from CRM [id number]. Topic: Clinical - Medication Refill >> Jul 31, 2023  2:40 PM Fonda Kinder J wrote: Most Recent Primary Care Visit:  Provider: [facility]  Department: [facility]  Visit Type: LAB  Date: 11/06/2022  Medication: rosuvastatin   Has the patient contacted their pharmacy? Yes (Agent: If no, request that the patient contact the pharmacy for the refill. If patient does not wish to contact the pharmacy document the reason why and proceed with request.) (Agent: If yes, when and what did the pharmacy advise?) Yes, contact PCP  Is this the correct pharmacy for this prescription? Yes If no, delete pharmacy and type the correct one.  This is the patient's preferred pharmacy:  [facility] - [place], [place] - [address] [place] [address] Phone: [phone] Fax: [phone]   Has the prescription been filled recently? No  Is the patient out of the medication? Yes  Has the patient been seen for an appointment in the last year OR does the patient have an upcoming appointment? Yes  Can we respond through MyChart? No  Agent: Please be advised that Rx refills may take up to 3 business days. We ask that you follow-up with your pharmacy.

## 2023-08-04 ENCOUNTER — Other Ambulatory Visit: Payer: Self-pay

## 2023-08-04 DIAGNOSIS — E7800 Pure hypercholesterolemia, unspecified: Secondary | ICD-10-CM

## 2023-08-04 MED ORDER — ROSUVASTATIN CALCIUM 10 MG PO TABS: ORAL_TABLET | ORAL | 0 refills | Status: DC

## 2023-08-04 NOTE — Telephone Encounter (Signed)
Refill sent.

## 2023-08-04 NOTE — Telephone Encounter (Signed)
Copied from CRM [id number]. Topic: Clinical - Medication Refill >> Aug 04, 2023  4:02 PM Hector Shade B wrote: Most Recent Primary Care Visit:  Provider: [facility]  Department: [facility]  Visit Type: LAB  Date: 11/06/2022  Medication: rosuvastatin (CRESTOR) 10 MG tablet  Has the patient contacted their pharmacy? Yes (Agent: If no, request that the patient contact the pharmacy for the refill. If patient does not wish to contact the pharmacy document the reason why and proceed with request.) (Agent: If yes, when and what did the pharmacy advise?)  Is this the correct pharmacy for this prescription? Yes If no, delete pharmacy and type the correct one.  This is the patient's preferred pharmacy:  [facility] - [place], [place] - [address] [place] [address] Phone: [phone] Fax: [phone]   Has the prescription been filled recently? Yes  Is the patient out of the medication? Yes  Has the patient been seen for an appointment in the last year OR does the patient have an upcoming appointment? Yes  Can we respond through MyChart? Yes  Agent: Please be advised that Rx refills may take up to 3 business days. We ask that you follow-up with your pharmacy.

## 2023-08-12 ENCOUNTER — Inpatient Hospital Stay: Payer: Medicare PPO

## 2023-08-12 ENCOUNTER — Other Ambulatory Visit: Payer: Medicare PPO

## 2023-08-12 ENCOUNTER — Other Ambulatory Visit: Payer: Self-pay | Admitting: *Deleted

## 2023-08-12 ENCOUNTER — Inpatient Hospital Stay: Payer: Medicare PPO | Attending: Oncology | Admitting: Oncology

## 2023-08-12 VITALS — BP 121/72 | HR 71 | Temp 98.1°F | Resp 18 | Ht 65.0 in | Wt 145.1 lb

## 2023-08-12 DIAGNOSIS — C649 Malignant neoplasm of unspecified kidney, except renal pelvis: Secondary | ICD-10-CM | POA: Diagnosis not present

## 2023-08-12 DIAGNOSIS — E7800 Pure hypercholesterolemia, unspecified: Secondary | ICD-10-CM | POA: Diagnosis not present

## 2023-08-12 DIAGNOSIS — Z85528 Personal history of other malignant neoplasm of kidney: Secondary | ICD-10-CM | POA: Diagnosis not present

## 2023-08-12 DIAGNOSIS — Z9081 Acquired absence of spleen: Secondary | ICD-10-CM | POA: Insufficient documentation

## 2023-08-12 LAB — CMP (CANCER CENTER ONLY)
ALT: 31 U/L (ref 0–44)
AST: 23 U/L (ref 15–41)
Albumin: 4.2 g/dL (ref 3.5–5.0)
Alkaline Phosphatase: 42 U/L (ref 38–126)
Anion gap: 7 (ref 5–15)
BUN: 19 mg/dL (ref 8–23)
CO2: 27 mmol/L (ref 22–32)
Calcium: 10.3 mg/dL (ref 8.9–10.3)
Chloride: 105 mmol/L (ref 98–111)
Creatinine: 0.99 mg/dL (ref 0.44–1.00)
GFR, Estimated: >60 mL/min (ref >60)
Glucose, Bld: 95 mg/dL (ref 70–99)
Potassium: 4.2 mmol/L (ref 3.5–5.1)
Sodium: 139 mmol/L (ref 135–145)
Total Bilirubin: 0.4 mg/dL (ref <1.2)
Total Protein: 7.1 g/dL (ref 6.5–8.1)

## 2023-08-12 NOTE — Progress Notes (Signed)
Per Dr. Truett Perna: change CT scan for March to without contrast and cancel BMP lab prior

## 2023-08-12 NOTE — Progress Notes (Signed)
  [facility] OFFICE PROGRESS NOTE   Diagnosis: Renal cell carcinoma  INTERVAL HISTORY:   Ms. Jenny Soto returns as scheduled.  She feels well.  Good appetite.  No new complaint.  Objective:  Vital signs in last 24 hours:  Blood pressure 121/72, pulse 71, temperature 98.1 F (36.7 C), temperature source Temporal, resp. rate 18, height 5\' 5"  (1.651 m), weight 145 lb 1.6 oz (65.8 kg), SpO2 100%.    Lymphatics: No cervical, supraclavicular, axillary, or inguinal nodes Resp: Lungs clear bilaterally Cardio: Regular rate and rhythm GI: No hepatosplenomegaly, no mass Vascular: No leg edema  Skin: Right chest wall scar without evidence of recurrent tumor  Lab Results:  Lab Results  Component Value Date   WBC 5.0 10/24/2022   HGB 14.6 10/24/2022   HCT 43.7 10/24/2022   MCV 98 (H) 10/24/2022   PLT 328 10/24/2022   NEUTROABS 4.5 06/25/2022    CMP  Lab Results  Component Value Date   NA 139 08/12/2023   K 4.2 08/12/2023   CL 105 08/12/2023   CO2 27 08/12/2023   GLUCOSE 95 08/12/2023   BUN 19 08/12/2023   CREATININE 0.99 08/12/2023   CALCIUM 10.3 08/12/2023   PROT 7.1 08/12/2023   ALBUMIN 4.2 08/12/2023   AST 23 08/12/2023   ALT 31 08/12/2023   ALKPHOS 42 08/12/2023   BILITOT 0.4 08/12/2023   GFRNONAA >60 08/12/2023   GFRAA 71 10/06/2020    Medications: I have reviewed the patient's current medications.   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, status post a left nephrectomy, 12.3 cm Fuhrman grade 3 with negative surgical margins and 1 negative lymph node Right lower lobe wedge resection 08/12/2008-metastatic renal cell carcinoma, negative parenchymal margin, negative level 11 R lymph nodes 09/26/2017-CT-guided cryoablation of right renal upper pole lesion FNA and core biopsy of right retroperitoneal lymph node 02/12/2018-highly suspicious, but not diagnostic of metastatic renal cell carcinoma CT 08/10/2018-no suspicious  abdominopelvic lymphadenopathy, right retrocaval lymph node no longer visualized CTs 06/25/2022 -5 mm right upper lobe and 4 mm left upper lobe nonspecific nodules, tiny arterial enhancing foci in the peripheral light liver measure up to 6 mm-1 lesion was present on 07/28/2019, no pathologically enlarged abdominal or pelvic nodes CT 10/28/2022-previously noted small pulmonary nodules have resolved, chronic post ablation change in the upper pole of the right kidney-stable  2.  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  Hypercholesterolemia 4.  Chronic mild elevation of the serum calcium     Disposition: Jenny Soto remains in clinical remission from renal cell carcinoma.  She will return for an office visit and surveillance CTs in March 2025.  She underwent a splenectomy in 2006.  We will be sure she is up-to-date on postsplenectomy vaccines.  Thornton Papas, MD  08/12/2023  9:35 AM

## 2023-08-14 ENCOUNTER — Encounter: Payer: Self-pay | Admitting: *Deleted

## 2023-08-14 ENCOUNTER — Telehealth: Payer: Self-pay | Admitting: *Deleted

## 2023-08-14 NOTE — Telephone Encounter (Signed)
Error

## 2023-10-02 ENCOUNTER — Encounter: Payer: Self-pay | Admitting: Family Medicine

## 2023-10-22 ENCOUNTER — Telehealth: Payer: Self-pay

## 2023-10-22 NOTE — Telephone Encounter (Signed)
 Called patient to let her know she would need to schedule an appt prior to leaving in march to maintain refills as her last appt was 10/30/22 for AWV. Recommended to follow up in 01/30/23 and it was canceled. Pt stated that she would see if oncology would refill medications and she would call back if that's not an option. Pt is wanting to schedule an appt in Oct.   Copied from CRM [id number]. Topic: Clinical - Medication Question >> Oct 22, 2023 11:12 AM Clayton Bibles wrote: Reason for CRM: She will be going out of town and will need to refill rosuvastatin (CRESTOR) 10 MG tablet while out of town. She wants to make sure there will not be any issues with the refill. Will she need an appointment to refill this medication? Please call her at [phone]. Thanks

## 2023-10-26 ENCOUNTER — Encounter: Payer: Self-pay | Admitting: Oncology

## 2023-10-31 ENCOUNTER — Ambulatory Visit (HOSPITAL_BASED_OUTPATIENT_CLINIC_OR_DEPARTMENT_OTHER)
Admission: RE | Admit: 2023-10-31 | Discharge: 2023-10-31 | Disposition: A | Discharge status: Home / Self Care | Payer: Medicare PPO | Source: Ambulatory Visit | Attending: Oncology | Admitting: Oncology

## 2023-10-31 DIAGNOSIS — R918 Other nonspecific abnormal finding of lung field: Secondary | ICD-10-CM | POA: Insufficient documentation

## 2023-10-31 DIAGNOSIS — C649 Malignant neoplasm of unspecified kidney, except renal pelvis: Secondary | ICD-10-CM

## 2023-10-31 DIAGNOSIS — C799 Secondary malignant neoplasm of unspecified site: Secondary | ICD-10-CM | POA: Diagnosis not present

## 2023-10-31 DIAGNOSIS — C642 Malignant neoplasm of left kidney, except renal pelvis: Secondary | ICD-10-CM | POA: Diagnosis not present

## 2023-10-31 DIAGNOSIS — Z9049 Acquired absence of other specified parts of digestive tract: Secondary | ICD-10-CM | POA: Diagnosis not present

## 2023-10-31 DIAGNOSIS — I700 Atherosclerosis of aorta: Secondary | ICD-10-CM | POA: Insufficient documentation

## 2023-10-31 DIAGNOSIS — Z905 Acquired absence of kidney: Secondary | ICD-10-CM | POA: Diagnosis not present

## 2023-11-05 ENCOUNTER — Inpatient Hospital Stay: Payer: Medicare PPO | Attending: Oncology | Admitting: Oncology

## 2023-11-05 ENCOUNTER — Telehealth: Payer: Self-pay | Admitting: Oncology

## 2023-11-05 DIAGNOSIS — Z85528 Personal history of other malignant neoplasm of kidney: Secondary | ICD-10-CM | POA: Diagnosis not present

## 2023-11-05 DIAGNOSIS — E7800 Pure hypercholesterolemia, unspecified: Secondary | ICD-10-CM | POA: Diagnosis not present

## 2023-11-05 DIAGNOSIS — C649 Malignant neoplasm of unspecified kidney, except renal pelvis: Secondary | ICD-10-CM

## 2023-11-05 NOTE — Telephone Encounter (Signed)
 Spoke with patient confirming upcoming appointment

## 2023-11-05 NOTE — Progress Notes (Signed)
 [facility]   HEMATOLOGY- ONCOLOGY TeleHEALTH OFFICE VISIT PROGRESS NOTE  I connected with Jenny Soto on 11/05/23 at 10 AM by telephone and verified that I am speaking with the correct person using two identifiers.   I discussed the limitations, risks, security and privacy concerns of performing an evaluation and management service by telemedicine and the availability of in-person appointments. I also discussed with the patient that there may be a patient responsible charge related to this service. The patient expressed understanding and agreed to proceed.   Patient's location: Home Provider's location: Office   Diagnosis: Renal cell carcinoma  INTERVAL HISTORY:   Jenny Soto is seen for a telehealth visit per her request.  She feels well.  Good appetite and energy level.  No shortness of breath.  No recent infection other than mild "sniffles ".  No pain.   Lab Results:  Lab Results  Component Value Date   WBC 5.0 10/24/2022   HGB 14.6 10/24/2022   HCT 43.7 10/24/2022   MCV 98 (H) 10/24/2022   PLT 328 10/24/2022   NEUTROABS 4.5 06/25/2022    Imaging:  CT chest 10/31/2023: Images reviewed Medications: I have reviewed the patient's current medications.  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, status post a left nephrectomy, 12.3 cm Fuhrman grade 3 with negative surgical margins and 1 negative lymph node Right lower lobe wedge resection 08/12/2008-metastatic renal cell carcinoma, negative parenchymal margin, negative level 11 R lymph nodes 09/26/2017-CT-guided cryoablation of right renal upper pole lesion FNA and core biopsy of right retroperitoneal lymph node 02/12/2018-highly suspicious, but not diagnostic of metastatic renal cell carcinoma CT 08/10/2018-no suspicious abdominopelvic lymphadenopathy, right retrocaval lymph node no longer visualized CTs 06/25/2022 -5 mm right upper lobe and 4 mm left upper lobe nonspecific nodules, tiny  arterial enhancing foci in the peripheral light liver measure up to 6 mm-1 lesion was present on 07/28/2019, no pathologically enlarged abdominal or pelvic nodes CT 10/28/2022-previously noted small pulmonary nodules have resolved, chronic post ablation change in the upper pole of the right kidney-stable CTs 10/31/2023: New cluster of nodules in the left upper lobe favored to reflect infectious/inflammatory etiology, no evidence of metastatic disease in the abdomen or pelvis  2.  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  Hypercholesterolemia 4.  Chronic mild elevation of the serum calcium     Disposition: Jenny Soto is in clinical remission from renal cell carcinoma.  I discussed the restaging CT findings with her.  I reviewed the CT images.  The new nodular changes in the left lung are most likely inflammatory, but could indicate progression of the renal cell carcinoma.  She will be scheduled for a follow-up chest CT in 3 months. She requests a telehealth visit after the chest CT in 3 months.  She plans to return to [place] in October.  We will plan for office visit this fall.   I discussed the assessment and treatment plan with the patient. The patient was provided an opportunity to ask questions and all were answered. The patient agreed with the plan and demonstrated an understanding of the instructions.   The patient was advised to call back or seek an in-person evaluation if the symptoms worsen or if the condition fails to improve as anticipated.  I provided minutes of 20 minutes of chart review, telephone, and documentation time during this encounter, and > 50% was spent counseling as documented under my assessment & plan.  Thornton Papas MD  11/05/2023 10:02 AM

## 2023-11-13 ENCOUNTER — Ambulatory Visit: Payer: Medicare PPO

## 2023-12-10 ENCOUNTER — Encounter: Payer: Self-pay | Admitting: Oncology

## 2023-12-26 DIAGNOSIS — H40013 Open angle with borderline findings, low risk, bilateral: Secondary | ICD-10-CM | POA: Diagnosis not present

## 2023-12-29 ENCOUNTER — Encounter: Payer: Self-pay | Admitting: Family Medicine

## 2023-12-30 ENCOUNTER — Other Ambulatory Visit: Payer: Self-pay | Admitting: Family Medicine

## 2023-12-30 DIAGNOSIS — E7800 Pure hypercholesterolemia, unspecified: Secondary | ICD-10-CM

## 2023-12-30 MED ORDER — ROSUVASTATIN CALCIUM 10 MG PO TABS: ORAL_TABLET | ORAL | 0 refills | Status: DC

## 2023-12-31 ENCOUNTER — Other Ambulatory Visit: Payer: Self-pay | Admitting: Family Medicine

## 2023-12-31 DIAGNOSIS — E7800 Pure hypercholesterolemia, unspecified: Secondary | ICD-10-CM

## 2023-12-31 MED ORDER — ROSUVASTATIN CALCIUM 10 MG PO TABS: ORAL_TABLET | ORAL | 0 refills | Status: AC

## 2024-01-12 ENCOUNTER — Telehealth: Admitting: Oncology

## 2024-02-09 ENCOUNTER — Ambulatory Visit (HOSPITAL_COMMUNITY)
Admission: RE | Admit: 2024-02-09 | Discharge: 2024-02-09 | Disposition: A | Discharge status: Home / Self Care | Source: Ambulatory Visit | Attending: Oncology | Admitting: Oncology

## 2024-02-09 DIAGNOSIS — R918 Other nonspecific abnormal finding of lung field: Secondary | ICD-10-CM | POA: Insufficient documentation

## 2024-02-09 DIAGNOSIS — R911 Solitary pulmonary nodule: Secondary | ICD-10-CM | POA: Diagnosis not present

## 2024-02-09 DIAGNOSIS — C649 Malignant neoplasm of unspecified kidney, except renal pelvis: Secondary | ICD-10-CM | POA: Insufficient documentation

## 2024-02-12 ENCOUNTER — Inpatient Hospital Stay: Attending: Oncology | Admitting: Oncology

## 2024-02-12 DIAGNOSIS — C649 Malignant neoplasm of unspecified kidney, except renal pelvis: Secondary | ICD-10-CM

## 2024-02-12 DIAGNOSIS — Z85528 Personal history of other malignant neoplasm of kidney: Secondary | ICD-10-CM | POA: Diagnosis not present

## 2024-02-12 NOTE — Progress Notes (Signed)
  [facility]   HEMATOLOGY- ONCOLOGY TeleHEALTH OFFICE VISIT PROGRESS NOTE  I connected with Jenny Soto on 02/12/24 at 10:00 AM EDT by telephone and verified that I am speaking with the correct person using two identifiers.   I discussed the limitations, risks, security and privacy concerns of performing an evaluation and management service by telemedicine and the availability of in-person appointments. I also discussed with the patient that there may be a patient responsible charge related to this service. The patient expressed understanding and agreed to proceed.  Patient's location: Home Provider's location: Office   Diagnosis: Renal cell carcinoma  INTERVAL HISTORY:   Jenny Soto is seen today for a telehealth visit per her request.  She feels well.  Good appetite.  No dyspnea or cough.  No pain.  No hematuria.  She is living at [facility] and plans to return to [place] in November.   Lab Results:  Lab Results  Component Value Date   WBC 5.0 10/24/2022   HGB 14.6 10/24/2022   HCT 43.7 10/24/2022   MCV 98 (H) 10/24/2022   PLT 328 10/24/2022   NEUTROABS 4.5 06/25/2022    Imaging:  CT images from 02/09/2024 reviewed.  Medications: I have reviewed the patient's current medications.  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, status post a left nephrectomy, 12.3 cm Fuhrman grade 3 with negative surgical margins and 1 negative lymph node Right lower lobe wedge resection 08/12/2008-metastatic renal cell carcinoma, negative parenchymal margin, negative level 11 R lymph nodes 09/26/2017-CT-guided cryoablation of right renal upper pole lesion FNA and core biopsy of right retroperitoneal lymph node 02/12/2018-highly suspicious, but not diagnostic of metastatic renal cell carcinoma CT 08/10/2018-no suspicious abdominopelvic lymphadenopathy, right retrocaval lymph node no longer visualized CTs 06/25/2022 -5 mm right upper lobe and 4 mm left  upper lobe nonspecific nodules, tiny arterial enhancing foci in the peripheral light liver measure up to 6 mm-1 lesion was present on 07/28/2019, no pathologically enlarged abdominal or pelvic nodes CT 10/28/2022-previously noted small pulmonary nodules have resolved, chronic post ablation change in the upper pole of the right kidney-stable CTs 10/31/2023: New cluster of nodules in the left upper lobe favored to reflect infectious/inflammatory etiology, no evidence of metastatic disease in the abdomen or pelvis CT chest 02/09/2024: Clustered nodularity in the posterior left upper lobe-unchanged  2.  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  Hypercholesterolemia 4.  Chronic mild elevation of the serum calcium     Disposition: Jenny Soto is in clinical remission from renal cell carcinoma.  I reviewed the restaging CT findings with her.  I reviewed the images.  The chest CT reveals stable clustered peribronchovascular nodularity in the left upper lobe.  The plan is to continue observation.  She will return for an office visit and repeat chest CT in 6 months.   I discussed the assessment and treatment plan with the patient. The patient was provided an opportunity to ask questions and all were answered. The patient agreed with the plan and demonstrated an understanding of the instructions.   The patient was advised to call back or seek an in-person evaluation if the symptoms worsen or if the condition fails to improve as anticipated.  I provided 20 minutes of telephone, chart review, and documentation time during this encounter, and > 50% was spent counseling as documented under my assessment & plan.  Coni Deep MD  02/12/2024 10:18 AM

## 2024-02-13 ENCOUNTER — Encounter: Payer: Self-pay | Admitting: Oncology

## 2024-02-16 ENCOUNTER — Other Ambulatory Visit: Payer: Self-pay | Admitting: *Deleted

## 2024-02-16 DIAGNOSIS — C649 Malignant neoplasm of unspecified kidney, except renal pelvis: Secondary | ICD-10-CM

## 2024-02-16 NOTE — Progress Notes (Signed)
 Patient wants her annual CT abd/pelvis be added to the CT chest in December. OK per Dr. Cloretta. CT chest cancelled and new order placed for C/A/P. Patient notified

## 2024-06-16 ENCOUNTER — Encounter: Payer: Self-pay | Admitting: Oncology

## 2024-07-05 ENCOUNTER — Encounter: Payer: Self-pay | Admitting: Oncology

## 2024-07-06 ENCOUNTER — Telehealth: Payer: Self-pay | Admitting: Oncology

## 2024-07-06 NOTE — Telephone Encounter (Signed)
 Called Pt to reschedule Scan Review app, spouse will have her give us  a callback.

## 2024-07-06 NOTE — Telephone Encounter (Signed)
 PT called and would like to know if her Scan Review appt can be over the phone. Sent message to nurse.

## 2024-07-14 ENCOUNTER — Encounter: Payer: Self-pay | Admitting: Oncology

## 2024-07-27 ENCOUNTER — Encounter (HOSPITAL_COMMUNITY): Payer: Self-pay

## 2024-07-27 ENCOUNTER — Ambulatory Visit (HOSPITAL_COMMUNITY)

## 2024-07-27 ENCOUNTER — Ambulatory Visit (HOSPITAL_COMMUNITY)
Admission: RE | Admit: 2024-07-27 | Discharge: 2024-07-27 | Disposition: A | Source: Ambulatory Visit | Attending: Oncology | Admitting: Oncology

## 2024-07-27 DIAGNOSIS — K5730 Diverticulosis of large intestine without perforation or abscess without bleeding: Secondary | ICD-10-CM | POA: Diagnosis not present

## 2024-07-27 DIAGNOSIS — Z9081 Acquired absence of spleen: Secondary | ICD-10-CM | POA: Diagnosis not present

## 2024-07-27 DIAGNOSIS — I700 Atherosclerosis of aorta: Secondary | ICD-10-CM | POA: Diagnosis not present

## 2024-07-27 DIAGNOSIS — C649 Malignant neoplasm of unspecified kidney, except renal pelvis: Secondary | ICD-10-CM | POA: Insufficient documentation

## 2024-07-27 DIAGNOSIS — D259 Leiomyoma of uterus, unspecified: Secondary | ICD-10-CM | POA: Diagnosis not present

## 2024-07-27 DIAGNOSIS — Z905 Acquired absence of kidney: Secondary | ICD-10-CM | POA: Diagnosis not present

## 2024-07-27 MED ORDER — SODIUM CHLORIDE (PF) 0.9 % IJ SOLN
INTRAMUSCULAR | Status: AC
  Filled 2024-07-27: qty 50

## 2024-08-02 ENCOUNTER — Telehealth: Payer: Self-pay | Admitting: Oncology

## 2024-08-03 ENCOUNTER — Inpatient Hospital Stay: Attending: Oncology | Admitting: Oncology

## 2024-08-03 DIAGNOSIS — Z9081 Acquired absence of spleen: Secondary | ICD-10-CM | POA: Diagnosis not present

## 2024-08-03 DIAGNOSIS — K7689 Other specified diseases of liver: Secondary | ICD-10-CM | POA: Insufficient documentation

## 2024-08-03 DIAGNOSIS — Z85528 Personal history of other malignant neoplasm of kidney: Secondary | ICD-10-CM | POA: Insufficient documentation

## 2024-08-03 DIAGNOSIS — E7800 Pure hypercholesterolemia, unspecified: Secondary | ICD-10-CM | POA: Diagnosis not present

## 2024-08-03 DIAGNOSIS — C649 Malignant neoplasm of unspecified kidney, except renal pelvis: Secondary | ICD-10-CM

## 2024-08-03 DIAGNOSIS — Z905 Acquired absence of kidney: Secondary | ICD-10-CM | POA: Insufficient documentation

## 2024-08-03 NOTE — Progress Notes (Signed)
  [facility]   HEMATOLOGY- ONCOLOGY TeleHEALTH OFFICE VISIT PROGRESS NOTE  I connected with Anaija Trotti on 08/03/24 at 10:20 AM EST by phone and verified that I am speaking with the correct person using two identifiers.   I discussed the limitations, risks, security and privacy concerns of performing an evaluation and management service by telemedicine and the availability of in-person appointments. I also discussed with the patient that there may be a patient responsible charge related to this service. The patient expressed understanding and agreed to proceed.   Patient's location: Home Provider's location: Office    Diagnosis: Renal cell carcinoma  INTERVAL HISTORY:   Ms. Colaizzi is seen for a telehealth visit per her request.  She reports feeling well.  Good appetite and energy level.  No new complaint.    Lab Results:  Lab Results  Component Value Date   WBC 5.0 10/24/2022   HGB 14.6 10/24/2022   HCT 43.7 10/24/2022   MCV 98 (H) 10/24/2022   PLT 328 10/24/2022   NEUTROABS 4.5 06/25/2022    Imaging:  No results found.  Medications: I have reviewed the patient's current medications.  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, status post a left nephrectomy, 12.3 cm Fuhrman grade 3 with negative surgical margins and 1 negative lymph node Right lower lobe wedge resection 08/12/2008-metastatic renal cell carcinoma, negative parenchymal margin, negative level 11 R lymph nodes 09/26/2017-CT-guided cryoablation of right renal upper pole lesion FNA and core biopsy of right retroperitoneal lymph node 02/12/2018-highly suspicious, but not diagnostic of metastatic renal cell carcinoma CT 08/10/2018-no suspicious abdominopelvic lymphadenopathy, right retrocaval lymph node no longer visualized CTs 06/25/2022 -5 mm right upper lobe and 4 mm left upper lobe nonspecific nodules, tiny arterial enhancing foci in the peripheral light liver measure up  to 6 mm-1 lesion was present on 07/28/2019, no pathologically enlarged abdominal or pelvic nodes CT 10/28/2022-previously noted small pulmonary nodules have resolved, chronic post ablation change in the upper pole of the right kidney-stable CTs 10/31/2023: New cluster of nodules in the left upper lobe favored to reflect infectious/inflammatory etiology, no evidence of metastatic disease in the abdomen or pelvis CT chest 02/09/2024: Clustered nodularity in the posterior left upper lobe-unchanged CTs 07/27/2024: No evidence of recurrent or metastatic renal cell carcinoma, clustered nodularity in the posterior left lobe with resolution of dominant nodule and clearing of other small nodules, multiple splenule's in the splenectomy bed   2.  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  Hypercholesterolemia 4.  Chronic mild elevation of the serum calcium         Disposition: Ms. Olivar remains in clinical remission from renal cell carcinoma.  The surveillance CTs reveal no evidence of disease progression.  Nodularity in the left lung has improved.  She will return for an office visit and surveillance CTs in 6 months.   I discussed the assessment and treatment plan with the patient. The patient was provided an opportunity to ask questions and all were answered. The patient agreed with the plan and demonstrated an understanding of the instructions.   The patient was advised to call back or seek an in-person evaluation if the symptoms worsen or if the condition fails to improve as anticipated.  I provided 20 minutes of chart review, telephone, and documentation time during this encounter, and > 50% was spent counseling as documented under my assessment & plan.  Arley Hof MD  08/03/2024 10:19 AM

## 2025-02-01 ENCOUNTER — Inpatient Hospital Stay: Admitting: Oncology
